# Patient Record
Sex: Female | Born: 1970 | Race: Black or African American | Hispanic: No | Marital: Married | Smoking: Never smoker
Health system: Southern US, Community
[De-identification: ages and names within clinical notes are randomized; demographics above are authoritative.]

## PROBLEM LIST (undated history)

## (undated) DIAGNOSIS — B9689 Other specified bacterial agents as the cause of diseases classified elsewhere: Secondary | ICD-10-CM

## (undated) DIAGNOSIS — K589 Irritable bowel syndrome without diarrhea: Secondary | ICD-10-CM

## (undated) DIAGNOSIS — D219 Benign neoplasm of connective and other soft tissue, unspecified: Secondary | ICD-10-CM

## (undated) DIAGNOSIS — Z803 Family history of malignant neoplasm of breast: Secondary | ICD-10-CM

## (undated) DIAGNOSIS — M797 Fibromyalgia: Secondary | ICD-10-CM

## (undated) DIAGNOSIS — T4145XA Adverse effect of unspecified anesthetic, initial encounter: Secondary | ICD-10-CM

## (undated) DIAGNOSIS — T8859XA Other complications of anesthesia, initial encounter: Secondary | ICD-10-CM

## (undated) DIAGNOSIS — E213 Hyperparathyroidism, unspecified: Secondary | ICD-10-CM

## (undated) DIAGNOSIS — Z87442 Personal history of urinary calculi: Secondary | ICD-10-CM

## (undated) DIAGNOSIS — I10 Essential (primary) hypertension: Secondary | ICD-10-CM

## (undated) DIAGNOSIS — Z8719 Personal history of other diseases of the digestive system: Secondary | ICD-10-CM

## (undated) DIAGNOSIS — N760 Acute vaginitis: Secondary | ICD-10-CM

## (undated) DIAGNOSIS — D649 Anemia, unspecified: Secondary | ICD-10-CM

## (undated) HISTORY — DX: Hyperparathyroidism, unspecified: E21.3

## (undated) HISTORY — PX: TUBAL LIGATION: SHX77

## (undated) HISTORY — DX: Family history of malignant neoplasm of breast: Z80.3

## (undated) HISTORY — DX: Anemia, unspecified: D64.9

---
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
## 1999-01-26 ENCOUNTER — Emergency Department (HOSPITAL_COMMUNITY): Admission: EM | Admit: 1999-01-26 | Discharge: 1999-01-26 | Payer: Self-pay | Admitting: Emergency Medicine

## 2000-03-01 ENCOUNTER — Emergency Department (HOSPITAL_COMMUNITY): Admission: EM | Admit: 2000-03-01 | Discharge: 2000-03-01 | Payer: Self-pay | Admitting: Internal Medicine

## 2001-10-14 ENCOUNTER — Emergency Department (HOSPITAL_COMMUNITY): Admission: EM | Admit: 2001-10-14 | Discharge: 2001-10-14 | Payer: Self-pay | Admitting: Emergency Medicine

## 2002-05-15 ENCOUNTER — Encounter (INDEPENDENT_AMBULATORY_CARE_PROVIDER_SITE_OTHER): Payer: Self-pay

## 2002-05-15 ENCOUNTER — Ambulatory Visit (HOSPITAL_COMMUNITY): Admission: RE | Admit: 2002-05-15 | Discharge: 2002-05-15 | Payer: Self-pay | Admitting: Obstetrics and Gynecology

## 2002-07-21 ENCOUNTER — Emergency Department (HOSPITAL_COMMUNITY): Admission: EM | Admit: 2002-07-21 | Discharge: 2002-07-21 | Payer: Self-pay | Admitting: Emergency Medicine

## 2002-07-21 ENCOUNTER — Encounter: Payer: Self-pay | Admitting: Emergency Medicine

## 2002-08-06 ENCOUNTER — Emergency Department (HOSPITAL_COMMUNITY): Admission: EM | Admit: 2002-08-06 | Discharge: 2002-08-06 | Payer: Self-pay | Admitting: Emergency Medicine

## 2003-11-12 ENCOUNTER — Ambulatory Visit (HOSPITAL_COMMUNITY): Admission: RE | Admit: 2003-11-12 | Discharge: 2003-11-12 | Payer: Self-pay | Admitting: Gastroenterology

## 2004-04-29 ENCOUNTER — Emergency Department (HOSPITAL_COMMUNITY): Admission: EM | Admit: 2004-04-29 | Discharge: 2004-04-29 | Payer: Self-pay | Admitting: Emergency Medicine

## 2004-07-20 ENCOUNTER — Ambulatory Visit: Payer: Self-pay | Admitting: Family Medicine

## 2004-08-23 ENCOUNTER — Ambulatory Visit: Payer: Self-pay | Admitting: *Deleted

## 2004-09-15 ENCOUNTER — Ambulatory Visit: Payer: Self-pay | Admitting: Family Medicine

## 2004-09-29 ENCOUNTER — Ambulatory Visit: Payer: Self-pay | Admitting: Family Medicine

## 2005-01-25 ENCOUNTER — Ambulatory Visit: Payer: Self-pay | Admitting: Internal Medicine

## 2005-02-08 ENCOUNTER — Ambulatory Visit: Payer: Self-pay | Admitting: Internal Medicine

## 2005-05-10 ENCOUNTER — Emergency Department (HOSPITAL_COMMUNITY): Admission: EM | Admit: 2005-05-10 | Discharge: 2005-05-10 | Payer: Self-pay | Admitting: Emergency Medicine

## 2005-06-13 ENCOUNTER — Ambulatory Visit: Payer: Self-pay | Admitting: Internal Medicine

## 2005-07-17 ENCOUNTER — Emergency Department (HOSPITAL_COMMUNITY): Admission: EM | Admit: 2005-07-17 | Discharge: 2005-07-17 | Payer: Self-pay | Admitting: Emergency Medicine

## 2005-07-18 ENCOUNTER — Ambulatory Visit: Payer: Self-pay | Admitting: Internal Medicine

## 2005-07-18 ENCOUNTER — Ambulatory Visit (HOSPITAL_COMMUNITY): Admission: RE | Admit: 2005-07-18 | Discharge: 2005-07-18 | Payer: Self-pay | Admitting: Internal Medicine

## 2005-07-19 ENCOUNTER — Inpatient Hospital Stay (HOSPITAL_COMMUNITY): Admission: AD | Admit: 2005-07-19 | Discharge: 2005-07-19 | Payer: Self-pay | Admitting: *Deleted

## 2005-07-22 ENCOUNTER — Ambulatory Visit: Payer: Self-pay | Admitting: Internal Medicine

## 2005-07-22 LAB — CONVERTED CEMR LAB: Pap Smear: NEGATIVE

## 2005-09-28 ENCOUNTER — Ambulatory Visit: Payer: Self-pay | Admitting: Internal Medicine

## 2006-06-12 ENCOUNTER — Ambulatory Visit: Payer: Self-pay | Admitting: Gastroenterology

## 2006-06-29 ENCOUNTER — Ambulatory Visit: Payer: Self-pay | Admitting: Gastroenterology

## 2006-07-11 IMAGING — US US RENAL
1 series · 14 of 25 positions shown · non-contrast
Comparison: none

CLINICAL DATA: Kidney stones.  
RENAL/URINARY TRACT ULTRASOUND:
Correlation is made with the previous unenhanced CT performed on 07/17/05.
TECHNIQUE: Complete ultrasound examination of the urinary tract was performed including evaluation of the kidneys, renal collecting systems, and urinary bladder.
Multiple images of both kidneys were obtained.  The right kidney has a sagittal length of 12 cm and the left kidney has a sagittal length of 12.7 cm. No focal parenchymal abnormalities are identified.  No sonographic signs of renal calculi are seen.  This would correlate with the negative unenhanced CT which is more sensitive for small renal stones than ultrasound. No evidence for hydronephrosis or ureterectasis is identified.  The bladder was incompletely distended.  The patient reports that she has not had much to drink today.  No evidence for ureteral jets could be seen on either side and lack of bilateral visualization of jets combined with this history is most likely due to relative dehydrated state. No focal bladder abnormalities are seen.

[Series 1: us renal · 0.29mm/px · 14 of 44 slices shown]
[im 1/44]
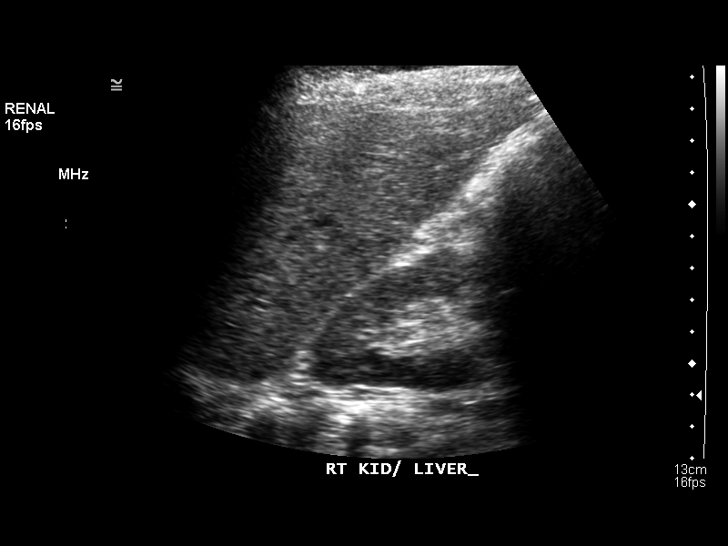
[im 4/44]
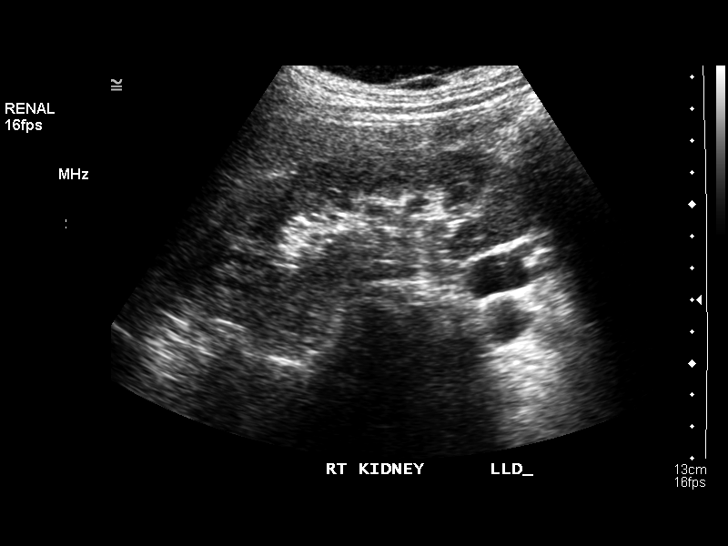
[im 8/44]
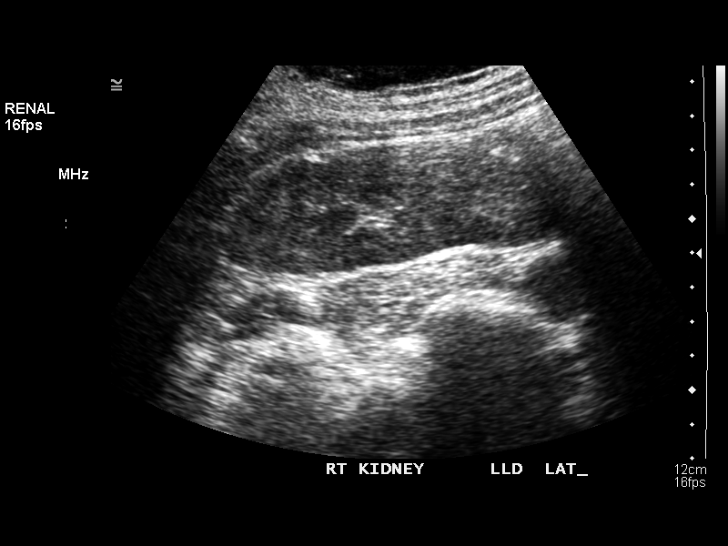
[im 11/44]
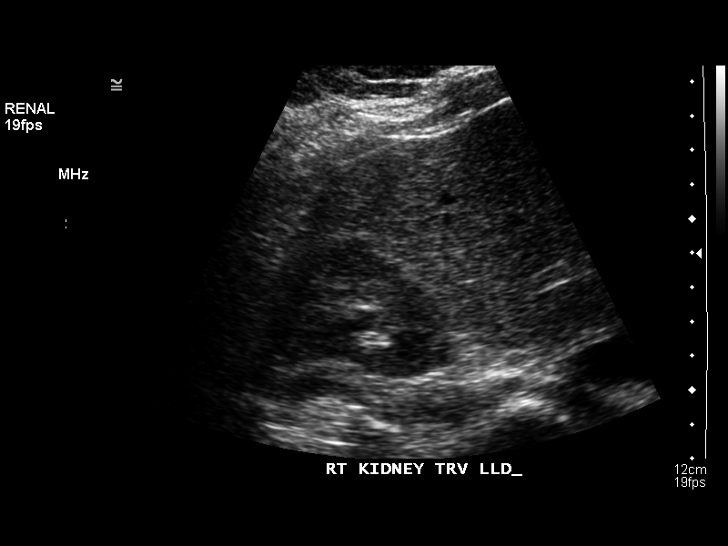
[im 15/44]
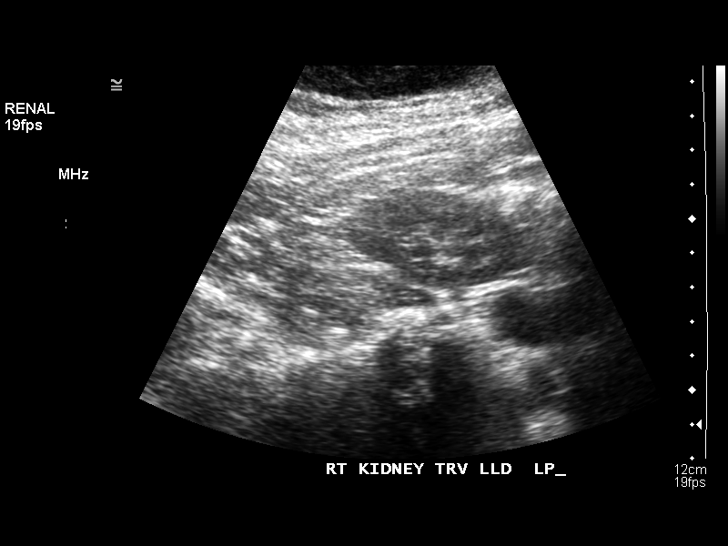
[im 17/44]
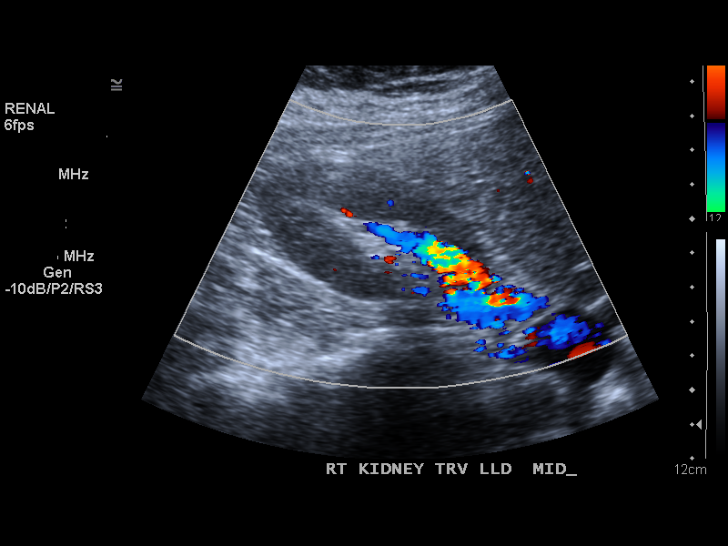
[im 20/44]
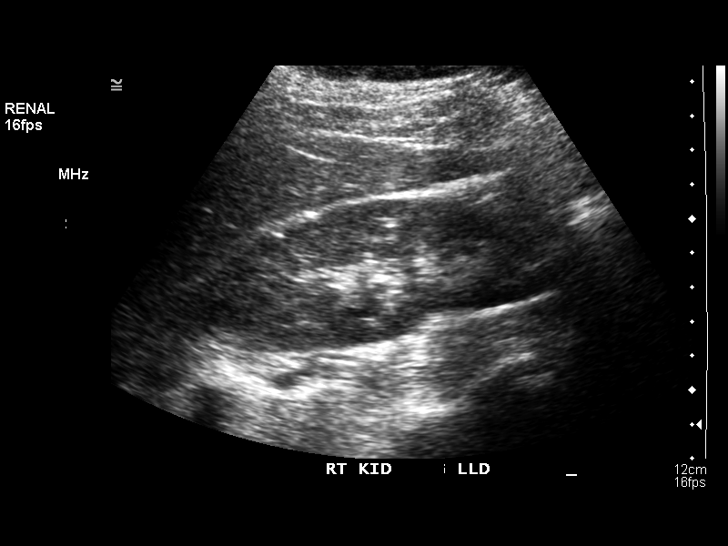
[im 24/44]
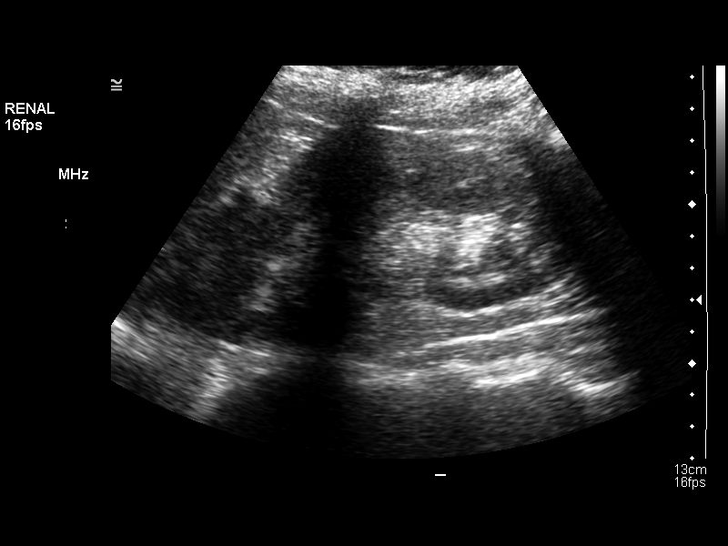
[im 27/44]
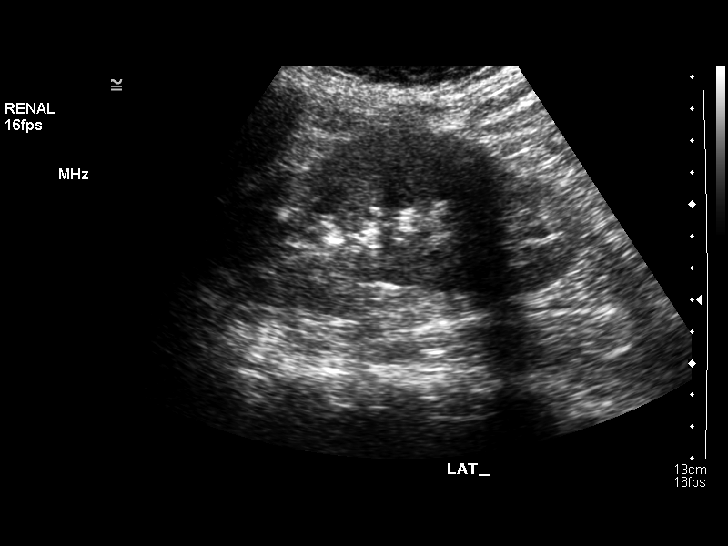
[im 29/44]
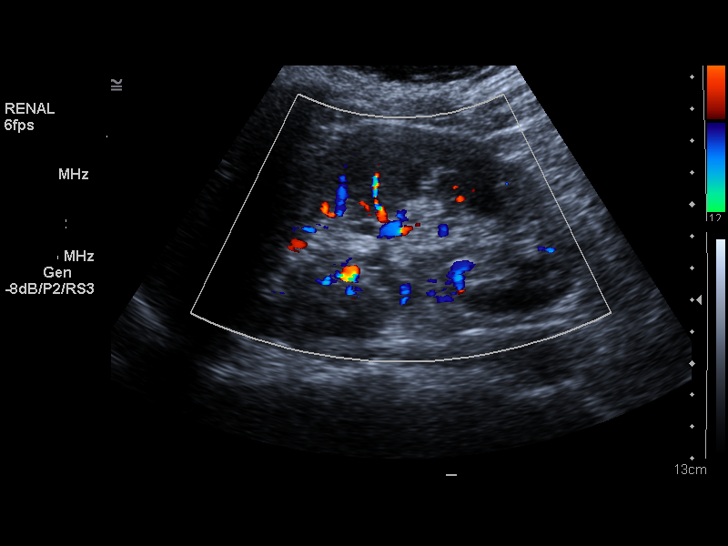
[im 33/44]
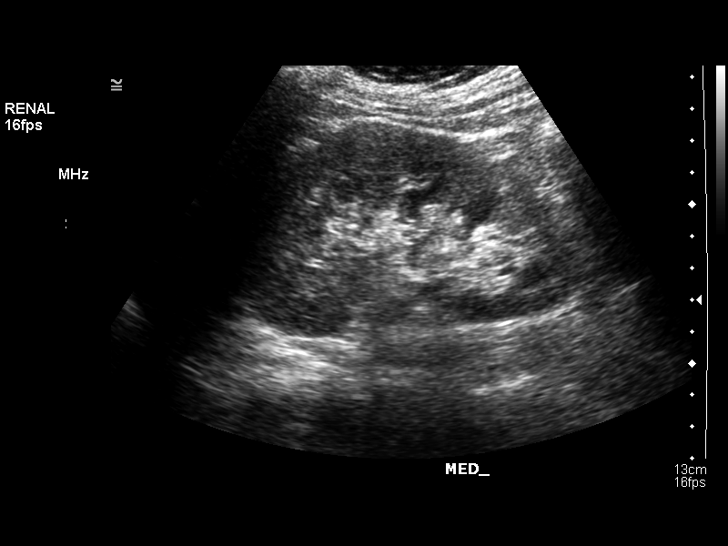
[im 36/44]
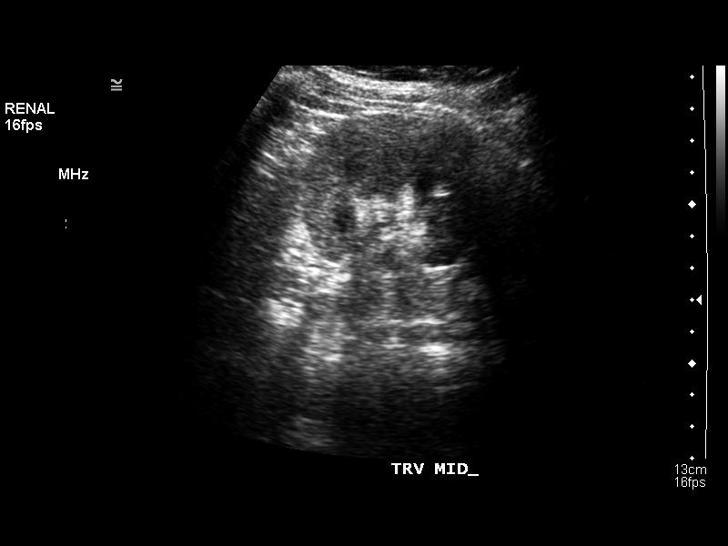
[im 40/44]
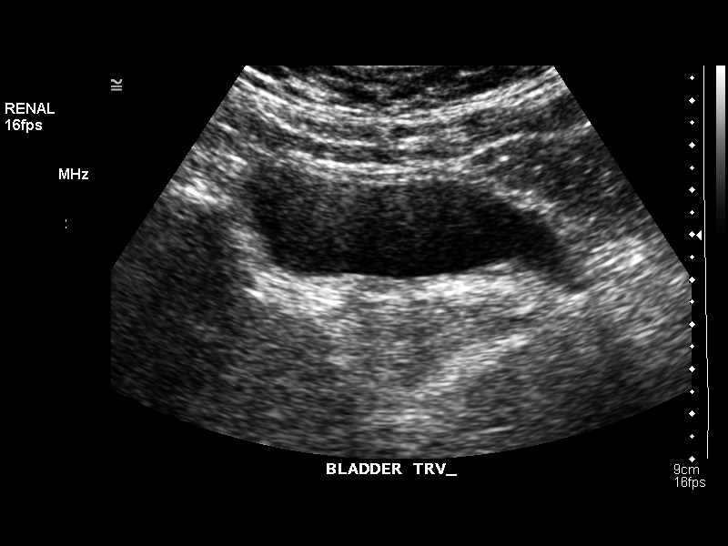
[im 44/44]
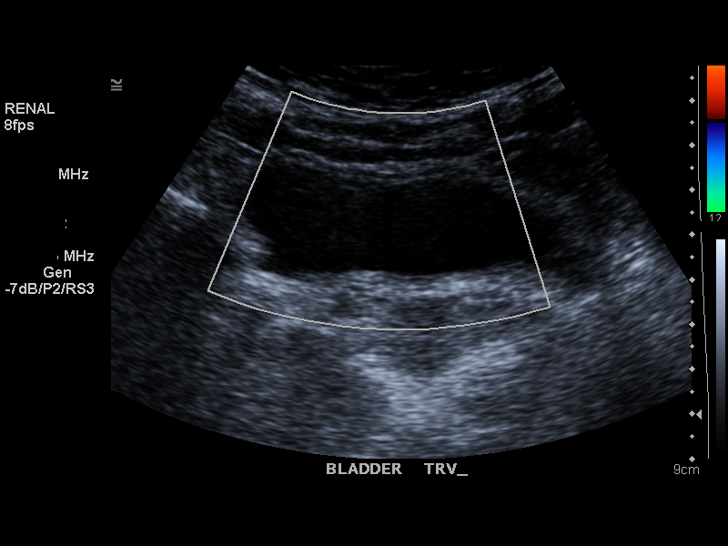

[14 of 25 positions shown; findings below may reference images not displayed]

IMPRESSION: Normal renal ultrasound.

## 2006-07-13 ENCOUNTER — Ambulatory Visit: Payer: Self-pay | Admitting: Gastroenterology

## 2006-07-20 ENCOUNTER — Encounter: Admission: RE | Admit: 2006-07-20 | Discharge: 2006-10-18 | Payer: Self-pay | Admitting: Sports Medicine

## 2006-07-25 ENCOUNTER — Ambulatory Visit: Payer: Self-pay | Admitting: Gastroenterology

## 2006-08-08 ENCOUNTER — Ambulatory Visit: Payer: Self-pay | Admitting: Gastroenterology

## 2006-09-01 ENCOUNTER — Ambulatory Visit: Payer: Self-pay | Admitting: Gastroenterology

## 2006-10-09 ENCOUNTER — Ambulatory Visit: Payer: Self-pay | Admitting: Gastroenterology

## 2006-11-01 ENCOUNTER — Ambulatory Visit: Payer: Self-pay | Admitting: Gastroenterology

## 2006-11-30 ENCOUNTER — Ambulatory Visit: Payer: Self-pay | Admitting: Gastroenterology

## 2007-04-18 ENCOUNTER — Ambulatory Visit: Payer: Self-pay | Admitting: Gastroenterology

## 2007-05-16 ENCOUNTER — Ambulatory Visit: Payer: Self-pay | Admitting: Gastroenterology

## 2007-09-06 ENCOUNTER — Ambulatory Visit: Payer: Self-pay | Admitting: Internal Medicine

## 2008-03-10 ENCOUNTER — Ambulatory Visit: Payer: Self-pay | Admitting: Nurse Practitioner

## 2008-03-10 DIAGNOSIS — N3289 Other specified disorders of bladder: Secondary | ICD-10-CM | POA: Insufficient documentation

## 2008-03-10 DIAGNOSIS — N760 Acute vaginitis: Secondary | ICD-10-CM | POA: Insufficient documentation

## 2008-03-10 DIAGNOSIS — K589 Irritable bowel syndrome without diarrhea: Secondary | ICD-10-CM

## 2008-03-10 HISTORY — DX: Irritable bowel syndrome, unspecified: K58.9

## 2008-03-10 LAB — CONVERTED CEMR LAB
Bilirubin Urine: NEGATIVE
Blood in Urine, dipstick: NEGATIVE
Glucose, Urine, Semiquant: NEGATIVE
Ketones, urine, test strip: NEGATIVE
Nitrite: NEGATIVE
Protein, U semiquant: NEGATIVE
Specific Gravity, Urine: <1.005
Urobilinogen, UA: 0.2
WBC Urine, dipstick: NEGATIVE
pH: 6.5

## 2008-03-11 ENCOUNTER — Encounter (INDEPENDENT_AMBULATORY_CARE_PROVIDER_SITE_OTHER): Payer: Self-pay | Admitting: Nurse Practitioner

## 2008-04-01 ENCOUNTER — Telehealth (INDEPENDENT_AMBULATORY_CARE_PROVIDER_SITE_OTHER): Payer: Self-pay | Admitting: Nurse Practitioner

## 2008-04-04 DIAGNOSIS — L408 Other psoriasis: Secondary | ICD-10-CM

## 2008-04-29 ENCOUNTER — Ambulatory Visit: Payer: Self-pay | Admitting: Nurse Practitioner

## 2008-04-29 DIAGNOSIS — D649 Anemia, unspecified: Secondary | ICD-10-CM

## 2008-04-29 DIAGNOSIS — R5383 Other fatigue: Secondary | ICD-10-CM | POA: Insufficient documentation

## 2008-04-29 DIAGNOSIS — E663 Overweight: Secondary | ICD-10-CM | POA: Insufficient documentation

## 2008-04-29 LAB — CONVERTED CEMR LAB
Bilirubin Urine: NEGATIVE
Blood in Urine, dipstick: NEGATIVE
Glucose, Urine, Semiquant: NEGATIVE
KOH Prep: NEGATIVE
Ketones, urine, test strip: NEGATIVE
Nitrite: NEGATIVE
Protein, U semiquant: 30
Specific Gravity, Urine: 1.025
Urobilinogen, UA: 1
WBC Urine, dipstick: NEGATIVE
pH: 6

## 2008-04-30 LAB — CONVERTED CEMR LAB
HCT: 35 % — ABNORMAL LOW (ref 36.0–46.0)
Hemoglobin: 10.9 g/dL — ABNORMAL LOW (ref 12.0–15.0)
MCHC: 31.1 g/dL (ref 30.0–36.0)
MCV: 100 fL (ref 78.0–100.0)
Pap Smear: NORMAL
Platelets: 301 10*3/uL (ref 150–400)
RBC: 3.5 M/uL — ABNORMAL LOW (ref 3.87–5.11)
RDW: 13.9 % (ref 11.5–15.5)
Retic Ct Pct: 1.7 % (ref 0.4–3.1)
WBC: 4.2 10*3/uL (ref 4.0–10.5)

## 2008-05-07 ENCOUNTER — Encounter (INDEPENDENT_AMBULATORY_CARE_PROVIDER_SITE_OTHER): Payer: Self-pay | Admitting: Nurse Practitioner

## 2008-05-15 ENCOUNTER — Encounter (INDEPENDENT_AMBULATORY_CARE_PROVIDER_SITE_OTHER): Payer: Self-pay | Admitting: *Deleted

## 2008-06-02 ENCOUNTER — Telehealth (INDEPENDENT_AMBULATORY_CARE_PROVIDER_SITE_OTHER): Payer: Self-pay | Admitting: *Deleted

## 2008-06-09 ENCOUNTER — Ambulatory Visit: Payer: Self-pay | Admitting: Nurse Practitioner

## 2008-06-11 LAB — CONVERTED CEMR LAB
Basophils Absolute: 0 10*3/uL (ref 0.0–0.1)
Basophils Relative: 0 % (ref 0–1)
Eosinophils Absolute: 0 10*3/uL (ref 0.0–0.7)
Eosinophils Relative: 1 % (ref 0–5)
HCT: 37.3 % (ref 36.0–46.0)
Hemoglobin: 11.7 g/dL — ABNORMAL LOW (ref 12.0–15.0)
Lymphocytes Relative: 39 % (ref 12–46)
Lymphs Abs: 1.7 10*3/uL (ref 0.7–4.0)
MCHC: 31.4 g/dL (ref 30.0–36.0)
MCV: 98.7 fL (ref 78.0–100.0)
Monocytes Absolute: 0.4 10*3/uL (ref 0.1–1.0)
Monocytes Relative: 8 % (ref 3–12)
Neutro Abs: 2.2 10*3/uL (ref 1.7–7.7)
Neutrophils Relative %: 52 % (ref 43–77)
Platelets: 314 10*3/uL (ref 150–400)
RBC: 3.78 M/uL — ABNORMAL LOW (ref 3.87–5.11)
RDW: 13.9 % (ref 11.5–15.5)
Vit D, 1,25-Dihydroxy: 41 (ref 30–89)
WBC: 4.3 10*3/uL (ref 4.0–10.5)

## 2008-06-18 ENCOUNTER — Telehealth (INDEPENDENT_AMBULATORY_CARE_PROVIDER_SITE_OTHER): Payer: Self-pay | Admitting: Nurse Practitioner

## 2008-08-22 ENCOUNTER — Telehealth (INDEPENDENT_AMBULATORY_CARE_PROVIDER_SITE_OTHER): Payer: Self-pay | Admitting: Nurse Practitioner

## 2008-09-01 ENCOUNTER — Telehealth (INDEPENDENT_AMBULATORY_CARE_PROVIDER_SITE_OTHER): Payer: Self-pay | Admitting: Nurse Practitioner

## 2008-09-26 ENCOUNTER — Emergency Department (HOSPITAL_COMMUNITY): Admission: EM | Admit: 2008-09-26 | Discharge: 2008-09-26 | Payer: Self-pay | Admitting: Emergency Medicine

## 2009-01-01 ENCOUNTER — Emergency Department (HOSPITAL_COMMUNITY): Admission: EM | Admit: 2009-01-01 | Discharge: 2009-01-01 | Payer: Self-pay | Admitting: Emergency Medicine

## 2009-06-01 ENCOUNTER — Telehealth (INDEPENDENT_AMBULATORY_CARE_PROVIDER_SITE_OTHER): Payer: Self-pay | Admitting: Nurse Practitioner

## 2009-06-08 ENCOUNTER — Ambulatory Visit: Payer: Self-pay | Admitting: Nurse Practitioner

## 2009-06-08 LAB — CONVERTED CEMR LAB
Blood in Urine, dipstick: NEGATIVE
Glucose, Urine, Semiquant: NEGATIVE
KOH Prep: NEGATIVE
Ketones, urine, test strip: NEGATIVE
Nitrite: NEGATIVE
Protein, U semiquant: 30
Specific Gravity, Urine: >=1.03
Urobilinogen, UA: 1
WBC Urine, dipstick: NEGATIVE
pH: 5

## 2009-06-12 ENCOUNTER — Telehealth (INDEPENDENT_AMBULATORY_CARE_PROVIDER_SITE_OTHER): Payer: Self-pay | Admitting: Nurse Practitioner

## 2009-11-25 ENCOUNTER — Telehealth (INDEPENDENT_AMBULATORY_CARE_PROVIDER_SITE_OTHER): Payer: Self-pay | Admitting: *Deleted

## 2009-11-30 ENCOUNTER — Ambulatory Visit: Payer: Self-pay | Admitting: Nurse Practitioner

## 2009-11-30 DIAGNOSIS — N898 Other specified noninflammatory disorders of vagina: Secondary | ICD-10-CM | POA: Insufficient documentation

## 2009-11-30 LAB — CONVERTED CEMR LAB
Bilirubin Urine: NEGATIVE
Blood in Urine, dipstick: NEGATIVE
Glucose, Urine, Semiquant: NEGATIVE
KOH Prep: NEGATIVE
Ketones, urine, test strip: NEGATIVE
Nitrite: NEGATIVE
Protein, U semiquant: 30
Rapid HIV Screen: NEGATIVE
Specific Gravity, Urine: 1.01
Urobilinogen, UA: 1
WBC Urine, dipstick: NEGATIVE
pH: 7.5

## 2009-12-01 ENCOUNTER — Telehealth (INDEPENDENT_AMBULATORY_CARE_PROVIDER_SITE_OTHER): Payer: Self-pay | Admitting: Nurse Practitioner

## 2009-12-02 ENCOUNTER — Encounter (INDEPENDENT_AMBULATORY_CARE_PROVIDER_SITE_OTHER): Payer: Self-pay | Admitting: Nurse Practitioner

## 2009-12-02 LAB — CONVERTED CEMR LAB
HCT: 35.5 % — ABNORMAL LOW (ref 36.0–46.0)
Hemoglobin: 11.4 g/dL — ABNORMAL LOW (ref 12.0–15.0)
MCHC: 32.1 g/dL (ref 30.0–36.0)
MCV: 95.9 fL (ref 78.0–100.0)
Platelets: 338 10*3/uL (ref 150–400)
RBC: 3.7 M/uL — ABNORMAL LOW (ref 3.87–5.11)
RDW: 14 % (ref 11.5–15.5)
Retic Ct Pct: 1.8 % (ref 0.4–3.1)
WBC: 4.3 10*3/uL (ref 4.0–10.5)

## 2010-01-05 ENCOUNTER — Ambulatory Visit: Payer: Self-pay | Admitting: Nurse Practitioner

## 2010-01-05 DIAGNOSIS — E559 Vitamin D deficiency, unspecified: Secondary | ICD-10-CM | POA: Insufficient documentation

## 2010-01-07 ENCOUNTER — Encounter (INDEPENDENT_AMBULATORY_CARE_PROVIDER_SITE_OTHER): Payer: Self-pay | Admitting: Nurse Practitioner

## 2010-01-07 LAB — CONVERTED CEMR LAB: Vit D, 25-Hydroxy: 34 ng/mL (ref 30–89)

## 2010-01-13 ENCOUNTER — Encounter (INDEPENDENT_AMBULATORY_CARE_PROVIDER_SITE_OTHER): Payer: Self-pay | Admitting: Nurse Practitioner

## 2010-02-16 ENCOUNTER — Ambulatory Visit: Payer: Self-pay | Admitting: Nurse Practitioner

## 2010-03-23 ENCOUNTER — Telehealth (INDEPENDENT_AMBULATORY_CARE_PROVIDER_SITE_OTHER): Payer: Self-pay | Admitting: *Deleted

## 2010-03-26 ENCOUNTER — Emergency Department (HOSPITAL_COMMUNITY): Admission: EM | Admit: 2010-03-26 | Discharge: 2010-03-26 | Payer: Self-pay | Admitting: Family Medicine

## 2010-03-29 ENCOUNTER — Telehealth (INDEPENDENT_AMBULATORY_CARE_PROVIDER_SITE_OTHER): Payer: Self-pay | Admitting: Nurse Practitioner

## 2010-04-02 ENCOUNTER — Encounter (INDEPENDENT_AMBULATORY_CARE_PROVIDER_SITE_OTHER): Payer: Self-pay | Admitting: *Deleted

## 2010-06-26 ENCOUNTER — Emergency Department (HOSPITAL_COMMUNITY)
Admission: EM | Admit: 2010-06-26 | Discharge: 2010-06-26 | Payer: Self-pay | Source: Home / Self Care | Admitting: Family Medicine

## 2010-07-21 ENCOUNTER — Ambulatory Visit: Payer: Self-pay | Admitting: Nurse Practitioner

## 2010-07-21 DIAGNOSIS — I10 Essential (primary) hypertension: Secondary | ICD-10-CM | POA: Insufficient documentation

## 2010-07-21 DIAGNOSIS — F329 Major depressive disorder, single episode, unspecified: Secondary | ICD-10-CM

## 2010-08-16 ENCOUNTER — Telehealth (INDEPENDENT_AMBULATORY_CARE_PROVIDER_SITE_OTHER): Payer: Self-pay | Admitting: Nurse Practitioner

## 2010-09-08 ENCOUNTER — Telehealth (INDEPENDENT_AMBULATORY_CARE_PROVIDER_SITE_OTHER): Payer: Self-pay | Admitting: Nurse Practitioner

## 2010-09-10 ENCOUNTER — Ambulatory Visit: Payer: Self-pay | Admitting: Nurse Practitioner

## 2010-09-10 LAB — CONVERTED CEMR LAB
Basophils Absolute: 0 10*3/uL (ref 0.0–0.1)
Basophils Relative: 0 % (ref 0–1)
Eosinophils Absolute: 0 10*3/uL (ref 0.0–0.7)
Eosinophils Relative: 1 % (ref 0–5)
HCT: 38.7 % (ref 36.0–46.0)
Hemoglobin: 12.6 g/dL (ref 12.0–15.0)
Lymphocytes Relative: 42 % (ref 12–46)
Lymphs Abs: 2.4 10*3/uL (ref 0.7–4.0)
MCHC: 32.6 g/dL (ref 30.0–36.0)
MCV: 95.8 fL (ref 78.0–100.0)
Monocytes Absolute: 0.5 10*3/uL (ref 0.1–1.0)
Monocytes Relative: 9 % (ref 3–12)
Neutro Abs: 2.7 10*3/uL (ref 1.7–7.7)
Neutrophils Relative %: 48 % (ref 43–77)
Platelets: 310 10*3/uL (ref 150–400)
RBC: 4.04 M/uL (ref 3.87–5.11)
RDW: 14.2 % (ref 11.5–15.5)
Vit D, 25-Hydroxy: 76 ng/mL (ref 30–89)
WBC: 5.7 10*3/uL (ref 4.0–10.5)

## 2010-09-13 ENCOUNTER — Encounter (INDEPENDENT_AMBULATORY_CARE_PROVIDER_SITE_OTHER): Payer: Self-pay | Admitting: Nurse Practitioner

## 2010-09-15 ENCOUNTER — Emergency Department (HOSPITAL_COMMUNITY): Admission: EM | Admit: 2010-09-15 | Discharge: 2010-09-15 | Payer: Self-pay | Admitting: Family Medicine

## 2010-10-13 ENCOUNTER — Telehealth (INDEPENDENT_AMBULATORY_CARE_PROVIDER_SITE_OTHER): Payer: Self-pay | Admitting: Nurse Practitioner

## 2010-10-19 ENCOUNTER — Telehealth (INDEPENDENT_AMBULATORY_CARE_PROVIDER_SITE_OTHER): Payer: Self-pay | Admitting: Nurse Practitioner

## 2010-10-27 ENCOUNTER — Telehealth (INDEPENDENT_AMBULATORY_CARE_PROVIDER_SITE_OTHER): Payer: Self-pay | Admitting: Nurse Practitioner

## 2010-10-28 ENCOUNTER — Telehealth (INDEPENDENT_AMBULATORY_CARE_PROVIDER_SITE_OTHER): Payer: Self-pay | Admitting: Nurse Practitioner

## 2010-11-06 ENCOUNTER — Emergency Department (HOSPITAL_COMMUNITY)
Admission: EM | Admit: 2010-11-06 | Discharge: 2010-11-06 | Payer: Self-pay | Source: Home / Self Care | Admitting: Family Medicine

## 2010-11-11 ENCOUNTER — Ambulatory Visit: Payer: Self-pay | Admitting: Nurse Practitioner

## 2010-12-12 ENCOUNTER — Encounter: Payer: Self-pay | Admitting: Internal Medicine

## 2010-12-14 ENCOUNTER — Encounter (INDEPENDENT_AMBULATORY_CARE_PROVIDER_SITE_OTHER): Payer: Self-pay | Admitting: Nurse Practitioner

## 2010-12-23 NOTE — Letter (Signed)
Summary: *HSN Results Follow up  [facility]  [address] [place], [place] [address]   Phone: [phone]  Fax: [phone]      09/13/2010   [name] [address] [place], [place]  [address]   Dear  [name],                            ____S.Drinkard,FNP   ____D. Gore,FNP       ____B. McPherson,MD   ____V. Rankins,MD    ____E. Mulberry,MD    __X__N. Daphine Deutscher, FNP  ____D. Reche Dixon, MD    ____K. Philipp Deputy, MD    ____Other     This letter is to inform you that your recent test(s):  _______Pap Smear    ___X____Lab Test     _______X-ray    ___X____ is within acceptable limits  _______ requires a medication change  _______ requires a follow-up lab visit  _______ requires a follow-up visit with your provider   Comments: Labs done during recent office visit are NORMAL.       _________________________________________________________ If you have any questions, please contact our office [phone].                    Sincerely,    Lehman Prom FNP [facility]

## 2010-12-23 NOTE — Progress Notes (Signed)
Summary: THINKS VITAMIN D IS LOW AGAIN  Phone Note Call from Patient Call back at Home Phone [phone]   Reason for Call: Acute Illness Summary of Call: [name] [name] CALLED TO GET AN APPT. FOR TOMORROW BECAUSE SHE IS EXHAUSTED AND SHE THINKS IT HER VITAMIN D LOW AGAIN. SHE IS FEELING LKIE SHE FELT BEFORE.  HER ELIG HAS EXPIRED AND SHE IS GOING TO TRY GOING [facility] TOMORROW. Initial call taken by: Leodis Rains,  Mar 29, 2010 4:54 PM  Follow-up for Phone Call        forward to N. Daphine Deutscher, fnp Follow-up by: Levon Hedger,  Mar 29, 2010 5:30 PM  Additional Follow-up for Phone Call Additional follow up Details #1::        the same message from phone note on 03/23/2010 applies where pt was informed  that if she is having an acute problem she will need to either renew her card or go to urgent care to be seen. She can take over the counter vitamin D 1000 units by mouth two times a day in the meantime until she can get her level rechecked. Additional Follow-up by: Lehman Prom FNP,  Mar 29, 2010 5:33 PM    Additional Follow-up for Phone Call Additional follow up Details #2::    Levon Hedger  Mar 30, 2010 12:34 PM Left message on machine for pt to return cal to the office.  Levon Hedger  Mar 31, 2010 12:03 PM Left message on machine for pt to return call to the office.  Levon Hedger  Apr 02, 2010 5:18 PM Left message on machine for pt to return call to the office.  Will mail letter.

## 2010-12-23 NOTE — Progress Notes (Signed)
Summary: Pt requesting for a medication to clean her vaginal bacteria  Phone Note Call from Patient Call back at [facility] Phone [phone] Call back at [phone]   Summary of Call: The pt needs to come for a physical exam on Jan 10 but she has a vaginal bacteria in her female area and she is wondering if the provider can call in something that her to clear up before her visit.  Pt goes to [facility] pharm [phone] FNP  Initial call taken by: Manon Hilding,  November 25, 2009 11:39 AM  Follow-up for Phone Call        Macy Mis, we do not call in meds for vaginal problems. pt must make an appt. please call pt to schedule an appt. Provider appt or nurse visit for that problem only.. Follow-up by: Mikey College CMA,  November 26, 2009 9:38 AM  Additional Follow-up for Phone Call Additional follow up Details #1::        I left a message in her voice mail to call me back.Manon Hilding  November 26, 2009 1:43 PM  Pt will come tomorrow at 3:15 for a nurse visit.Marland KitchenMarland KitchenManon Hilding  November 26, 2009 4:58 PM

## 2010-12-23 NOTE — Progress Notes (Signed)
Summary: acute- Recurrence of BV  Phone Note Call from Patient Call back at [phone]   Reason for Call: Acute Illness, Talk to Nurse Complaint: Urinary/GYN Problems Summary of Call: pt needs meds for vaginal discharge pt would like if you can [facility]. Initial call taken by: Oscar La,  August 16, 2010 9:25 AM  Follow-up for Phone Call        Feeling that BV is coming on, having odor that past two days, a little discharge, brownish in color.  Taking oral  probiotics.  Wants to know if she can have something called in, rather than coming in to see Larra Crunkleton. Follow-up by: Dutch Quint RN,  August 16, 2010 9:42 AM  Additional Follow-up for Phone Call Additional follow up Details #1::        Rx for metroconizole faxted to [facility] pharmacy if symptoms continue pt will need a f/u appt with Brentlee Sciara Additional Follow-up by: Lehman Prom FNP,  August 16, 2010 11:39 AM    Additional Follow-up for Phone Call Additional follow up Details #2::    Left message on answering machine for pt. to return call.  Dutch Quint RN  August 16, 2010 11:50 AM  Left message on answering machine for pt. to return call.  Dutch Quint RN  August 17, 2010 10:24 AM  Left message on answering machine for pt. to return call.  Dutch Quint RN  August 18, 2010 2:43 PM  Was no show for 08/18/10 appt.  Picked up Rx per pharmacy.  Dutch Quint RN  August 19, 2010 5:21 PM  New/Updated Medications: METRONIDAZOLE 500 MG TABS (METRONIDAZOLE) One tablet by mouth two times a day for infection Prescriptions: METRONIDAZOLE 500 MG TABS (METRONIDAZOLE) One tablet by mouth two times a day for infection  #14 x 0   Entered and Authorized by:   Lehman Prom FNP   Signed by:   Lehman Prom FNP on 08/16/2010   Method used:   Faxed to ...       [facility] - Pharmac (retail)       [address] [place], [place]  [address]       Ph:  [phone]       Fax: [phone]   RxID:   [id number]

## 2010-12-23 NOTE — Letter (Signed)
Summary: GYN CLINIC  GYN CLINIC   Imported By: Arta Bruce 12/16/2010 10:09:10  _____________________________________________________________________  External Attachment:    Type:   Image     Comment:   External Document

## 2010-12-23 NOTE — Letter (Signed)
Summary: Handout Printed  Printed Handout:  - Psoriasis

## 2010-12-23 NOTE — Progress Notes (Signed)
Summary: GYN clinic  Phone Note Outgoing Call   Summary of Call: Refer pt to GYN clinic - Cleoph[facility] Dunker frequent vaginal discharge Initial call taken by: Lehman Prom FNP,  October 28, 2010 7:27 PM  Follow-up for Phone Call        I call [facility] to schedule an appt with GYN and they told me that the pt need to call next week because right now they are not scheduling appts . and the same thing for podiatry. Do you want me to call the pt and told them to call [facility] N[facility] next week or hold the referrals until next week. I have 2 pts gyn and 1 podiatry  thank you  Follow-up by: Cheryll Dessert,  November 03, 2010 9:44 AM  Additional Follow-up for Phone Call Additional follow up Details #1::        just hold the referrals until next week, call [facility] and schedule pts then   *I will do that .Marland KitchenCheryll Dessert  November 05, 2010 10:45 AM  Additional Follow-up by: Lehman Prom FNP,  November 03, 2010 10:02 AM

## 2010-12-23 NOTE — Letter (Signed)
Summary: *HSN Results Follow up  [facility]  [address] [place], [place] [address]   Phone: [phone]  Fax: [phone]      01/07/2010   Katelyn Hardin [address] [place], [place]  [address]   Dear  [name],                            ____S.Drinkard,FNP   ____D. Gore,FNP       ____B. McPherson,MD   ____V. Rankins,MD    ____E. Mulberry,MD    _X___N. Daphine Deutscher, FNP  ____D. Reche Dixon, MD    ____K. Philipp Deputy, MD    ____Other     This letter is to inform you that your recent test(s):  _______Pap Smear    ___X____Lab Test     _______X-ray    __X_____ is within acceptable limits  _______ requires a medication change  _______ requires a follow-up lab visit  _______ requires a follow-up visit with your provider   Comments:  Your vitamin D level is back up to normal. You can now start taking over the counter vitamin d supplements 1000 units by mouth daily to keep your level up.      _________________________________________________________ If you have any questions, please contact our office [phone].                    Sincerely,    Lehman Prom FNP [facility]

## 2010-12-23 NOTE — Letter (Signed)
Summary: *HSN Results Follow up  [facility]  [address] [place], [place] [address]   Phone: [phone]  Fax: [phone]      04/02/2010   Reita May MCADOO [address] [place], [place]  [address]   Dear  Ms. Ladashia MCADOO,                            ____S.Drinkard,FNP   ____D. Gore,FNP       ____B. McPherson,MD   ____V. Rankins,MD    ____E. Mulberry,MD    ____N. Daphine Deutscher, FNP  ____D. Reche Dixon, MD    ____K. Philipp Deputy, MD    ____Other     This letter is to inform you that your recent test(s):  _______Pap Smear    _______Lab Test     _______X-ray    _______ is within acceptable limits  _______ requires a medication change  _______ requires a follow-up lab visit  _______ requires a follow-up visit with your provider   Comments:  We have tried to reach you at [phone].  You can take a Vitamin D supplement 1000 units one tablet by mouth twice a day.  This can be purchased over the counter at any pharmacy. Please schedule an eligibility appointment at your earliest convenience.  Once you have your eligibility then call to schedule an appointment.  If you have any questions please contact the office.       _________________________________________________________ If you have any questions, please contact our office                     Sincerely,  Levon Hedger [facility]

## 2010-12-23 NOTE — Progress Notes (Signed)
Summary: Office Visit/DEPRESSION SCREENING  Office Visit/DEPRESSION SCREENING   Imported By: Arta Bruce 01/13/2010 10:43:03  _____________________________________________________________________  External Attachment:    Type:   Image     Comment:   External Document

## 2010-12-23 NOTE — Assessment & Plan Note (Signed)
Summary: Psoriasis   Vital Signs:  Patient profile:   40 year old female Height:      67 inches Weight:      167.6 pounds BMI:     26.34 Temp:     98.3 degrees F Pulse rate:   85 / minute Pulse rhythm:   regular Resp:     20 per minute BP sitting:   144 / 93  (left arm) Cuff size:   regular  Vitals Entered By: Vesta Mixer CMA (February 16, 2010 8:29 AM)  Nutrition Counseling: Patient's BMI is greater than 25 and therefore counseled on weight management options. CC: Rash on neck first noticed last Sunday, it does not itch.  She is not sure if it is her psoriasis or from the braids she had or what. Is Patient Diabetic? No Pain Assessment Patient in pain? no       Does patient need assistance? Ambulation Normal   CC:  Rash on neck first noticed last Sunday and it does not itch.  She is not sure if it is her psoriasis or from the braids she had or what..  History of Present Illness:  Pt into the office with a complaints of a rash. She noticed 1.5 week ago on the back of her neck and upper back. Area don't itch She does have a history of psoriasis but has never had symptoms in current area She did have braids in her hair until 1 week ago but they had been in for 2 months. She is really unsure when the rash started as the affected area was covered with braids.  She has been using the desonide (previously prescribed) and the areas are clearing. Uses Dove soap   She has other sporatic areas of concern around her waist line and on inner thigh.   Dry, crusty areas - usually puts vasoline to affected areas  Obesity - efforts continue at weight loss.  Down 5 pounds since last visit  Elevated blood pressure - no hx of elevated blood pressure in effort to lose weight she is eating some foods with preservatives and high sodium content  Habits & Providers  Alcohol-Tobacco-Diet     Alcohol drinks/day: <1     Alcohol Counseling: not indicated; use of alcohol is not excessive or  problematic     Alcohol type: wine     Tobacco Status: never  Exercise-Depression-Behavior     Does Patient Exercise: no     Drug Use: no     Seat Belt Use: 100     Sun Exposure: occasionally  Allergies (verified): 1)  ! Tylenol  Review of Systems CV:  Denies chest pain or discomfort. Resp:  Denies cough. Derm:  Complains of rash; upper neck and back.  Physical Exam  General:  alert.   Head:  normocephalic.   Msk:  up to the exam table Neurologic:  alert & oriented X3.   Skin:  posterior neck - dry plaques, scaly rash, psoriasis appearance waistline - dry, hyperpigmented papules Psych:  Oriented X3.     Impression & Recommendations:  Problem # 1:  PSORIASIS (ICD-696.1) Braids likely caused trauma, excoriation to the back of the neck which start psoriasis outbreak advised pt to continue to use ointment she has already removed the braids so this will not be of concern advised pt to use betasept to the other areas on her inner thighs  Problem # 2:  ELEVATED BLOOD PRESSURE WITHOUT DIAGNOSIS OF HYPERTENSION (ICD-796.2) handout given to pt  lifestyle modifications no meds at this time  Complete Medication List: 1)  Boric Acid 600mg   .... 1 suppository intravaginally nightly x 2 weeks 2)  Desonide 0.05 % Oint (Desonide) .Marland Kitchen.. 1 application topically to affected area  two times a day as needed 3)  Ferrous Sulfate 325 (65 Fe) Mg Tbec (Ferrous sulfate) .... One tablet by mouth daily 4)  Betasept Surgical Scrub 4 % Liqd (Chlorhexidine gluconate) .... One application topically to affected area, let sit for 5 mintues then rinse.  use three times per week  Patient Instructions: 1)  Use the Betasept topically to affected area on your waist and inner thigh three times per week.  Let sit for 5 minutes then rinse 2)  Boric Acid prescription should be sent to [facility]. Check there for prescription previously sent. 3)  Follow up as needed. Prescriptions: DESONIDE 0.05 %  OINT  (DESONIDE) 1 application topically to affected area  two times a day as needed  #60gm x 1   Entered and Authorized by:   Lehman Prom FNP   Signed by:   Lehman Prom FNP on 02/16/2010   Method used:   Faxed to ...       [facility] - Pharmac (retail)       [address] [place], [place]  [address]       Ph: [phone]       Fax: [phone]   RxID:   [id number] BETASEPT SURGICAL SCRUB 4 % LIQD (CHLORHEXIDINE GLUCONATE) One application topically to affected area, let sit for 5 mintues then rinse.  Use three times per week  #146ml x 0   Entered and Authorized by:   Lehman Prom FNP   Signed by:   Lehman Prom FNP on 02/16/2010   Method used:   Faxed to ...       [facility] - Pharmac (retail)       [address] [place], [place]  [address]       Ph: [phone]       Fax: [phone]   RxID:   [id number]

## 2010-12-23 NOTE — Progress Notes (Signed)
Summary: poss bacterial inf  Phone Note Call from Patient Call back at [phone]   Summary of Call: Pt is possitive that she has a bacteria infection again in her female area and the pt wants to know if the provider can call in something to the pharmacy . Pt orange card already expired in April 2011 so that is why she needs if is possible the med can be call in.  [facility] [address] [phone])  Daphine Deutscher FNP Initial call taken by: Manon Hilding,  Mar 23, 2010 8:57 AM  Follow-up for Phone Call        please schedule for next available appt. Follow-up by: [name] RN,  Mar 23, 2010 12:24 PM  Additional Follow-up for Phone Call Additional follow up Details #1::        MS MCADOO CALLED AND SAYS THAT SHE CANT DO ELIG. UNTIL JUNE, SO SHE IS ASKING IF YOU CAN CALL SOMETHING INTO [facility] ON [address]. Additional Follow-up by: Leodis Rains,  Mar 24, 2010 9:36 AM    Additional Follow-up for Phone Call Additional follow up Details #2::    I deliverd the message to the pt from the provider and she will go to the Urgent Care to be treated.Manon Hilding  Mar 25, 2010 9:06 AM

## 2010-12-23 NOTE — Assessment & Plan Note (Signed)
Summary: Complete Physical Exam   Vital Signs:  Patient profile:   40 year old female Weight:      172.7 pounds BMI:     27.15 BSA:     1.90 Temp:     98.0 degrees F oral Pulse rate:   73 / minute Pulse rhythm:   regular Resp:     16 per minute BP sitting:   133 / 86  (left arm) Cuff size:   regular  Vitals Entered By: Levon Hedger (November 30, 2009 11:32 AM)  History of Present Illness:  Pt into the office for a complete physical exam.  Social - married with 2 adult children  PAP - last over 1 year ago no family hx of cervical or ovarian cancer C/o vaginal odor. discharge has subsided but odor is the main problem pt was advised on last week to start eating yogurt, no douching, clean cotton underwear, drink plenty of water  Mammogram - never had a mammogram no self breast exams at home  tdap - up to date  optho - no eye exam in over 1.5 years.  no current problems with eyes  dental - no recent dental exam. no problem with teeth   Habits & Providers  Alcohol-Tobacco-Diet     Alcohol type: alcohol     Tobacco Status: never  Exercise-Depression-Behavior     Does Patient Exercise: no     Have you felt down or hopeless? no     Have you felt little pleasure in things? no     Drug Use: no     Seat Belt Use: 100     Sun Exposure: occasionally  Comments: PHQ-9 score = 12  Allergies: 1)  ! Tylenol  Review of Systems General:  Complains of fatigue. Eyes:  Denies blurring. ENT:  Denies earache. CV:  Denies chest pain or discomfort. Resp:  Denies cough. GI:  Denies abdominal pain, nausea, and vomiting. GU:  Complains of discharge; denies urinary frequency. MS:  Denies joint pain. Derm:  Denies rash. Neuro:  Denies headaches. Psych:  Denies anxiety and depression.  Physical Exam  General:  alert.   Head:  normocephalic.   Eyes:  pupils equal and pupils round.   Ears:  R ear normal and L ear normal.  Bil TM visible with bony landmarks present Nose:   no nasal discharge.   Mouth:  pharynx pink and moist and fair dentition.   Neck:  supple.   Chest Wall:  no mass.   Breasts:  no masses.   Lungs:  normal respiratory effort.   Heart:  normal rate and regular rhythm.   Abdomen:  soft, non-tender, and normal bowel sounds.   Rectal:  no external abnormalities.   Msk:  normal ROM.   Pulses:  R radial normal, R dorsalis pedis normal, L radial normal, and L dorsalis pedis normal.   Extremities:  no edema Neurologic:  alert & oriented X3 and gait normal.   Skin:  color normal.   Psych:  Oriented X3.    Pelvic Exam  Vulva:      normal appearance.   Urethra and Bladder:      Urethra--normal.  Bladder--normal.   Vagina:      malodorus.  introdus with ? warts  Cervix:      nabothian cysts.  noted at 3 o'clock Uterus:      smooth.   Adnexa:      nontender bilaterally.   Rectum:      normal.  Impression & Recommendations:  Problem # 1:  HEALTH MAINTENANCE EXAM (ICD-V70.0) PAP done labs done optho and dental exam rec PHQ-9 score = 12 self breast exam placcard given Orders: UA Dipstick w/o Micro (manual) (82956) KOH/ WET Mount [phone]) Pap Smear, Thin Prep ( Collection of) [phone]) T- GC Chlamydia (69629) T-Lipid Profile [id number]) T-Comprehensive Metabolic Panel [phone]) T-CBC w/Diff (40347-42595) Rapid HIV  (92370) T-Syphilis Test (RPR) (63875-64332) T-TSH (95188-41660)  Problem # 2:  NEED PROPHYLACTIC VACCINATION&INOCULATION FLU (ICD-V04.81) given today  Problem # 3:  VAGINAL DISCHARGE (ICD-623.5) will check today with PAP BV dx today during exam Orders: T-Herpes Simplex Type 1 (63016-01093) T-Herpes Simplex Type 2 (23557-32202)  Problem # 4:  FATIGUE (ICD-780.79) will check vitamin D.  hx of defienciency Orders: T-Vitamin D 25-Hydroxy & 1,25 Dihydroxy (8147)  Complete Medication List: 1)  Boric Acid 600mg   .... 1 suppository intravaginally nightly x 2 weeks 2)  Desonide 0.05 % Oint (Desonide)  .Marland Kitchen.. 1 application topically to affected area  two times a day as needed 3)  Metamucil 48.57 % Powd (Psyllium) .Marland Kitchen.. 1 tablespoon mixed in at least 8 oz of water or juice daily 4)  Ferrous Sulfate 325 (65 Fe) Mg Tbec (Ferrous sulfate) .Marland Kitchen.. 1 tablet by mouth two times a day to build up blood 5)  Vitamin D 1000 Unit Caps (Cholecalciferol) .... Hold 6)  Clindamycin Hcl 300 Mg Caps (Clindamycin hcl) .... One tablet by mouth two times a day for infection  Other Orders: Flu Vaccine 56yrs + (54270) Admin 1st Vaccine (62376) Admin 1st Vaccine [facility]) [phone])  Patient Instructions: 1)  You will be notified of any abnormal lab results 2)  You should check your breasts at home monthly.  Mammogram is not indicated until age 39. 3)  You have recieved the flu vaccine today 4)  Bacterial vaginosis - overgrowth of normal bacteria is seen today under the microscope. 5)  Start all the techniques described on last week 6)  -yogurt three times per wee 7)  -drink plenty of water 8)  -be sure the seat of your underwear is white cotton 9)  -absolutely NO scented body wash or soap in the vaginal area 10)  Follow up as needed Prescriptions: BORIC ACID 600MG  1 suppository intravaginally nightly x 2 weeks  #14 x 1   Entered and Authorized by:   Lehman Prom FNP   Signed by:   Lehman Prom FNP on 11/30/2009   Method used:   Print then Give to Patient   RxID:   [id number] CLINDAMYCIN HCL 300 MG CAPS (CLINDAMYCIN HCL) One tablet by mouth two times a day for infection  #14 x 0   Entered and Authorized by:   Lehman Prom FNP   Signed by:   Lehman Prom FNP on 11/30/2009   Method used:   Print then Give to Patient   RxID:   [id number]   Laboratory Results   Urine Tests  Date/Time Received: November 30, 2009 12:45 PM  Date/Time Reported: November 30, 2009 12:45 PM   Routine Urinalysis   Color: lt. yellow Appearance: Clear Glucose: negative   (Normal Range: Negative) Bilirubin:  negative   (Normal Range: Negative) Ketone: negative   (Normal Range: Negative) Spec. Gravity: 1.010   (Normal Range: 1.003-1.035) Blood: negative   (Normal Range: Negative) pH: 7.5   (Normal Range: 5.0-8.0) Protein: 30   (Normal Range: Negative) Urobilinogen: 1.0   (Normal Range: 0-1) Nitrite: negative   (Normal Range: Negative) Leukocyte Esterace: negative   (Normal Range:  Negative)    Date/Time Received: November 30, 2009 12:16 PM   [employer] Source: vaginal WBC/hpf: 5-10 Bacteria/hpf: 1+ Clue cells/hpf: moderate Yeast/hpf: none Wet Mount KOH: Negative Trichomonas/hpf: none  Other Tests  Rapid HIV: negative    Influenza Vaccine    Vaccine Type: Fluvax 3+    Site: left deltoid    Mfr: Sanofi Pasteur    Dose: 0.5 ml    Route: IM    Given by: Levon Hedger    Exp. Date: 05/20/2010    Lot #: [id number]    VIS given: 06/14/07 version given November 30, 2009.  Flu Vaccine Consent Questions    Do you have a history of severe allergic reactions to this vaccine? no    Any prior history of allergic reactions to egg and/or gelatin? no    Do you have a sensitivity to the preservative Thimersol? no    Do you have a past history of Guillan-Barre Syndrome? no    Do you currently have an acute febrile illness? no    Have you ever had a severe reaction to latex? no    Vaccine information given and explained to patient? yes    Are you currently pregnant? no   Laboratory Results   Urine Tests    Routine Urinalysis   Color: lt. yellow Appearance: Clear Glucose: negative   (Normal Range: Negative) Bilirubin: negative   (Normal Range: Negative) Ketone: negative   (Normal Range: Negative) Spec. Gravity: 1.010   (Normal Range: 1.003-1.035) Blood: negative   (Normal Range: Negative) pH: 7.5   (Normal Range: 5.0-8.0) Protein: 30   (Normal Range: Negative) Urobilinogen: 1.0   (Normal Range: 0-1) Nitrite: negative   (Normal Range: Negative) Leukocyte Esterace: negative    (Normal Range: Negative)      Wet Mount/KOH  Other Tests  Rapid HIV: negative    Prevention & Chronic Care Immunizations   Influenza vaccine: Fluvax 3+  (11/30/2009)    Tetanus booster: 04/29/2008: Tdap    Pneumococcal vaccine: Not documented  Other Screening   Pap smear: normal  (04/30/2008)   Smoking status: never  (11/30/2009)  Lipids   Total Cholesterol: Not documented   LDL: Not documented   LDL Direct: Not documented   HDL: Not documented   Triglycerides: Not documented

## 2010-12-23 NOTE — Progress Notes (Signed)
Summary: CALL AFTER CYCLE  Phone Note Call from Patient Call back at Home Phone [phone]   Reason for Call: Talk to Nurse Summary of Call: Katelyn Hardin WAS TOLD TO CALL 3 DAYS AFTER HER CYCLE TO SEE IF THERE WAS A CHANGE AND SHE SASY ITS NOT MUCH OF A CHANGE, JUST A LITTLE, BUT ITS STILL THERE. Initial call taken by: Leodis Rains,  October 19, 2010 2:52 PM  Follow-up for Phone Call        spoke with pt and she says there is still a slight odor there and discharge . she went off her cycle on Friday and has bee using the medicine every night. Follow-up by: Levon Hedger,  October 19, 2010 3:16 PM  Additional Follow-up for Phone Call Additional follow up Details #1::        instructions should have been to WAIT for 3 days after cycle then if symptoms of discharge and odor restart then to use the suppository. up to 3 days following cycle it is not uncommon to have d/c odor so can't really assess. since she has already started using suppository then she can use for total of 5 nights then reassess. Using standard hygiene practices if beneficial Additional Follow-up by: Lehman Prom FNP,  October 19, 2010 3:30 PM    Additional Follow-up for Phone Call Additional follow up Details #2::    pt informed of above information. Follow-up by: Levon Hedger,  October 19, 2010 3:45 PM

## 2010-12-23 NOTE — Progress Notes (Signed)
Summary: WAS TOLD TO CALL YOU BACK  Phone Note Call from Patient Call back at Home Phone [phone]   Summary of Call: Katelyn Hardin SAYS SHE WAS TOLD TO CALL BACK IF THERE HAS BEEN A BIG CHANGE AND SHE SAYS IT REALLY HASN'T. Initial call taken by: Leodis Rains,  October 27, 2010 8:24 AM  Follow-up for Phone Call        spoke with pt and she said the symptoms is not all gone and she has waited five days... pt says she is having the discharge and light odor.. pt says the odor is not that strong but it is still there and she is still having itching... pt says this Saturday is the first time she had sex but she used a condom... she said monday she had sex and did not use condom and thats when she smelled the odor more... pt says she want you to call her in something.. pt says she knows her body and she will waste money keep coming  up for the same thing if the problem is the same... [place] [facility].... but if its cheap $4 she will go to [facility] [address].. Follow-up by: Arm[place] Shannon,  October 27, 2010 4:51 PM  Additional Follow-up for Phone Call Additional follow up Details #1::        If she knows her body then trial and error shows her symptoms occur when she does not use a condom. Perhaps she has an incompatibility with her partner. will treat again and then if problem returns perhaps she should be referred to GYN for their opinion. there is a GYN clinic at [facility] on the 4th wednesday of the month.  Will order her vaginal cream this time - she needs the cream to get precisely to affected area.  Rx sent to [facility] pharmacy Additional Follow-up by: Lehman Prom FNP,  October 27, 2010 5:32 PM    Additional Follow-up for Phone Call Additional follow up Details #2::    pt says she still will to like gyn clinic.Marland KitchenMarland KitchenArmenia Shannon  October 28, 2010 9:48 AM   New/Updated Medications: METROGEL-VAGINAL 0.75 % GEL (METRONIDAZOLE) One application intravaginally at  night Prescriptions: METROGEL-VAGINAL 0.75 % GEL (METRONIDAZOLE) One application intravaginally at night  #45gm x 0   Entered and Authorized by:   Lehman Prom FNP   Signed by:   Lehman Prom FNP on 10/27/2010   Method used:   Faxed to ...       [facility] (retail)       [address] [place], [place]  [address]       Ph: [phone]       Fax: [phone]   RxID:   [id number]

## 2010-12-23 NOTE — Progress Notes (Signed)
Summary: RX YESTERDAY WAS TOO EXPENSIVE  Phone Note Call from Patient Call back at Home Phone [phone]   Summary of Call: Katelyn Hardin PT. MS MCADOO CALLED AND SAYS THAT THE RX YOU GAVE HER YESTERDAY (CLINDAMYCIN) SHE TOOK TO [facility] AND IT WAS $40, SO SHE WANTS TO KNOW IF YOU CAN CALL IT INTO [facility]. SHE CALLED THEM, AND WAS TOLD THEY HAVE IT. Initial call taken by: Leodis Rains,  December 01, 2009 10:50 AM  Follow-up for Phone Call        Clindamycin called to [facility], pt notified Follow-up by: [name] RN,  December 01, 2009 11:02 AM

## 2010-12-23 NOTE — Progress Notes (Signed)
Summary: VITAMIN D IS LOW  Phone Note Call from Patient Call back at Home Phone [phone] Call back at [phone]   Summary of Call: [name] MS MCADOO SAYS THAT SHE THINKS HER VITAMIN D IS LOW, BECAUSE SHE IS FEELING VERY TIRED AND SLUGGISH. SHE WANTS TO KNOW IF SHE CAN COME AND JUST GET HER LABS DONE TO SEE IF IT IS LOW. Initial call taken by: Leodis Rains,  September 08, 2010 9:39 AM  Follow-up for Phone Call        Sent to N. Daphine Deutscher. Dutch Quint RN  September 08, 2010 11:09 AM   Additional Follow-up for Phone Call Additional follow up Details #1::        ok to schedule a lab visit - vitamin D and cbc Additional Follow-up by: Lehman Prom FNP,  September 08, 2010 12:19 PM    Additional Follow-up for Phone Call Additional follow up Details #2::    Appt. 09/10/10.  Dutch Quint RN  September 09, 2010 4:09 PM

## 2010-12-23 NOTE — Letter (Signed)
Summary: *HSN Results Follow up  [facility]  [address] [place], [place] [address]   Phone: [phone]  Fax: [phone]      12/02/2009   Reita May MCADOO [address] [place], [place]  [address]   Dear  Ms. Lillyahna MCADOO,                            ____S.Drinkard,FNP   ____D. Gore,FNP       ____B. McPherson,MD   ____V. Rankins,MD    ____E. Mulberry,MD    _X___N. Daphine Deutscher, FNP  ____D. Reche Dixon, MD    ____K. Philipp Deputy, MD    ____Other     This letter is to inform you that your recent test(s):  ____X___Pap Smear    _______Lab Test     _______X-ray    __X_____ is within acceptable limits  _______ requires a medication change  _______ requires a follow-up lab visit  _______ requires a follow-up visit with your provider   Comments: Pap Smear results are ok.    You should have spoken with this office about your vitamin D level and blood count.  If you have NOT by the time you recieve this letter then please call the office.     _________________________________________________________ If you have any questions, please contact our office [phone].                    Sincerely,    Lehman Prom FNP [facility]

## 2010-12-23 NOTE — Progress Notes (Signed)
Summary: TEST RESULT  Phone Note Call from Patient Call back at [phone]   Summary of Call: PATIENT IS CALLING TO [facility] ABOUT HER TEST RESULTS PLEASE CALL HER AT [phone] Initial call taken by: Domenic Polite,  October 13, 2010 8:47 AM  Follow-up for Phone Call        forward to N. Daphine Deutscher, fnp Follow-up by: Levon Hedger,  October 13, 2010 9:33 AM  Additional Follow-up for Phone Call Additional follow up Details #1::        pt has not been here in over 1 month for lab - is she taking about last month's lab visit letter sent - you can read info on letter if she did not receive Additional Follow-up by: Lehman Prom FNP,  October 13, 2010 10:31 AM    Additional Follow-up for Phone Call Additional follow up Details #2::    pt says she is not calling for test results but to let the provider know that she had some itching in her vagina...  the next morning she had a ordor.... pt says she feels as if she has BV again... pt says she has been having BV back to back.... pt wants to know if there is something she can take on a daily basis... pt says she is fraustrated and she doesnt know what to do... pt says she is wiping from front to back and everything else she can do... pt says she is on her cycle now.... pt says she has appt with you in Jan. but she can not wait til Jan. to get help for this.... pt says this is interfering with her life.... pt says it seems this is happening after sex and she gets up and wash and pee after sex.... pt says she went to urgent care in Oct. the provider told her to stop the probiotic pills but she says when she was on them they was working well but she stop because the provider at urgent care told her too.... Arm[place] Shannon  October 13, 2010 11:13 AM    Additional Follow-up for Phone Call Additional follow up Details #3:: Details for Additional Follow-up Action Taken: Tell her to start probiotic.  That is NOT causing vaginal problems.  it was also  working well for her stomach. There is something that she can take intermittently when she has BV symptoms. It's actually vaginally suppository and the pharmacy has to make it so she may have to get it from [facility] pharmacy [place] like [facility] [place], [facility] does not have it - because they do not mix meds) if she wants to try it I can prescribe it and send to [facility] - it is a boric acid suppository. That way she can tell me know she is doing in January at next scheduled appt n.martin,fnp October 13, 2010  11:41 AM   pt wants to know if she can use a generic brand for a probiotic she can use since the rephresh cost $30..pt says she knows she has a BV now are you going to call her something in.Marland KitchenMarland KitchenMarland KitchenArmenia Shannon  October 13, 2010 12:00 PM  Yes, pt can use generic probiotic.  Pt uses [facility].  they may be a compound pharmacy - which means they make meds on site.  Will fax there since she uses them already.  If for some reason they won't do it ask pt to tell them to transfer to [facility] pharmacy n.martin, fnp  October 13, 2010  3:12 PM  pt is aware and she knows to use the prescribe med and to get probiotic med....Marland KitchenMarland KitchenArmenia Shannon  October 13, 2010 3:21 PM   Prescriptions: BORIC ACID 600MG  1 suppository intravaginally nightly x 2 weeks  #14 x 1   Entered and Authorized by:   Lehman Prom FNP   Signed by:   Lehman Prom FNP on 10/13/2010   Method used:   Printed then faxed to ...       [facility] (retail)       [address]       [place], [place]  [id number]       Ph: [phone]       Fax: [phone]   RxID:   [id number]

## 2010-12-23 NOTE — Progress Notes (Signed)
Summary: Office Visit/DEPRESSION SCREENING  Office Visit/DEPRESSION SCREENING   Imported By: Arta Bruce 07/21/2010 14:21:55  _____________________________________________________________________  External Attachment:    Type:   Image     Comment:   External Document

## 2010-12-23 NOTE — Letter (Signed)
Summary: Handout Printed  Printed Handout:  - Depression-Brief 

## 2010-12-23 NOTE — Assessment & Plan Note (Signed)
Summary: HTN/Depression   Vital Signs:  Patient profile:   40 year old female LMP:     07/01/2010 Weight:      167.7 pounds BMI:     26.36 Temp:     99.8 degrees F oral Pulse rate:   80 / minute Pulse rhythm:   regular Resp:     20 per minute BP sitting:   140 / 110  (left arm) Cuff size:   regular  Vitals Entered By: Levon Hedger (July 21, 2010 10:58 AM)  Nutrition Counseling: Patient's BMI is greater than 25 and therefore counseled on weight management options. CC: Bp issues...feeling extremely tired, headaches daily...wants to ask some questions on getting a stress test done, Depression, Hypertension Management Is Patient Diabetic? No Pain Assessment Patient in pain? no       Does patient need assistance? Functional Status Self care Ambulation Normal LMP (date): 07/01/2010 LMP - Character: normal     Enter LMP: 07/01/2010 Last PAP Result  Specimen Adequacy: Satisfactory for evaluation.   Interpretation/Result:Negative for intraepithelial Lesion or Malignancy.      CC:  Bp issues...feeling extremely tired, headaches daily...wants to ask some questions on getting a stress test done, Depression, and Hypertension Management.  History of Present Illness:  Pt into the office to f/u on blood pressure She went to urgent care a few weeks ago and blood pressure was elevated she was advised to start checking her blood pressure at home and it has been elevated no previous blood pressure medications  Stress - Pt has been under lots of stress lately  Depression History:      The patient presents with symptoms of depression which have been present for greater than two weeks.  She notes that the symptoms started approximately 04/21/2010.  The patient notes a diminished interest in her usual daily activities.  Positive alarm features for depression include insomnia and impaired concentration (indecisiveness).  However, she denies recurrent thoughts of death or suicide.       Psychosocial stress factors include major life changes.  The patient denies that she feels like life is not worth living, denies that she wishes that she were dead, and denies that she has thought about ending her life.        Comments:  Pt has taken some OTC meds - advil PM for sleep.  Hypertension History:      She denies headache, chest pain, and palpitations.  She notes no problems with any antihypertensive medication side effects.  124/95, 149/101, 127/88, 127/93, 137/104.  Further comments include: Pt brings her blood pressure machine into the office.        Positive major cardiovascular risk factors include hypertension.  Negative major cardiovascular risk factors include female age less than 15 years old, no history of diabetes or hyperlipidemia, negative family history for ischemic heart disease, and non-tobacco-user status.        Further assessment for target organ damage reveals no history of ASHD, cardiac end-organ damage (CHF/LVH), stroke/TIA, peripheral vascular disease, renal insufficiency, or hypertensive retinopathy.      Habits & Providers  Alcohol-Tobacco-Diet     Alcohol drinks/day: <1     Alcohol Counseling: not indicated; use of alcohol is not excessive or problematic     Alcohol type: wine     Tobacco Status: never  Exercise-Depression-Behavior     Does Patient Exercise: no     Have you felt down or hopeless? yes     Have you felt little pleasure in  things? yes     Depression Counseling: not indicated; screening negative for depression     Drug Use: no     Seat Belt Use: 100     Sun Exposure: occasionally  Comments: Pt has a treadmill at home but does not use it PHQ-9 score = 18  Allergies (verified): 1)  ! Tylenol  Review of Systems General:  Complains of fatigue; denies fever. CV:  Complains of palpitations; denies chest pain or discomfort. Resp:  Denies cough. GI:  Denies abdominal pain. Psych:  Complains of depression.  Physical Exam  General:   alert.   Head:  normocephalic.   Ears:  ear piercing(s) noted.   Lungs:  normal breath sounds.   Heart:  normal rate and regular rhythm.   Msk:  up to the exam  Neurologic:  alert & oriented X3.   Skin:  color normal.   Psych:  Oriented X3.     Impression & Recommendations:  Problem # 1:  DEPRESSION (ICD-311) handout given lexapro samples  PHQ-9 score = 18 Her updated medication list for this problem includes:    Lexapro 10 Mg Tabs (Escitalopram oxalate) ..... One tablet by mouth daily for mood  Problem # 2:  HYPERTENSION, BENIGN ESSENTIAL (ICD-401.1)  advised pt of lifestyle modifications will start low dose diuretic  Her updated medication list for this problem includes:    Hydrochlorothiazide 25 Mg Tabs (Hydrochlorothiazide) .Marland Kitchen... Take one (1) by mouth daily  Complete Medication List: 1)  Boric Acid 600mg   .... 1 suppository intravaginally nightly x 2 weeks 2)  Desonide 0.05 % Oint (Desonide) .Marland Kitchen.. 1 application topically to affected area  two times a day as needed 3)  Ferrous Sulfate 325 (65 Fe) Mg Tbec (Ferrous sulfate) .... One tablet by mouth daily 4)  Betasept Surgical Scrub 4 % Liqd (Chlorhexidine gluconate) .... One application topically to affected area, let sit for 5 mintues then rinse.  use three times per week 5)  Hydrochlorothiazide 25 Mg Tabs (Hydrochlorothiazide) .... Take one (1) by mouth daily 6)  Lexapro 10 Mg Tabs (Escitalopram oxalate) .... One tablet by mouth daily for mood  Hypertension Assessment/Plan:      The patient's hypertensive risk group is category A: No risk factors and no target organ damage.  Her calculated 10 year risk of coronary heart disease is 3 %.  Today's blood pressure is 140/110.  Her blood pressure goal is < 140/90.  Patient Instructions: 1)  Blood pressure - Stage I 2)  Read the handout and make some lifestyle changes 3)  Start HCTZ 25mg  by mouth daily 4)  Be sure to decrease sodium in your diet 5)  Keep checking your blood  pressure and record.  Be sure that heart is at heart level when you check. 6)  Depression - read handout 7)  start lexapro 10mg  by mouth daily 8)  This will help to improve your mood and sleep pattern 9)  YOU MUST FIND TIME FOR EXERCISE 10)  Schedule this in your routine.  This may actually allow time for relaxation 11)  Follow up in 4 weeks with n.martin,fnp for mood and blood pressure.  Med review - lexapro Prescriptions: LEXAPRO 10 MG TABS (ESCITALOPRAM OXALATE) One tablet by mouth daily for mood  #28 x 0   Entered and Authorized by:   Lehman Prom FNP   Signed by:   Lehman Prom FNP on 07/21/2010   Method used:   Samples Given   RxID:   [id number] HYDROCHLOROTHIAZIDE  25 MG TABS (HYDROCHLOROTHIAZIDE) Take one (1) by mouth daily  #30 x 3   Entered and Authorized by:   Lehman Prom FNP   Signed by:   Lehman Prom FNP on 07/21/2010   Method used:   Print then Give to Patient   RxID:   [id number]

## 2010-12-24 NOTE — Letter (Signed)
Summary: BLOOD PRESSURE READINGS  BLOOD PRESSURE READINGS   Imported By: Arta Bruce 07/22/2010 11:45:24  _____________________________________________________________________  External Attachment:    Type:   Image     Comment:   External Document

## 2011-01-05 ENCOUNTER — Inpatient Hospital Stay (INDEPENDENT_AMBULATORY_CARE_PROVIDER_SITE_OTHER)
Admission: RE | Admit: 2011-01-05 | Discharge: 2011-01-05 | Disposition: A | Discharge status: Home / Self Care | Payer: Self-pay | Source: Ambulatory Visit | Attending: Family Medicine | Admitting: Family Medicine

## 2011-01-05 ENCOUNTER — Ambulatory Visit (INDEPENDENT_AMBULATORY_CARE_PROVIDER_SITE_OTHER): Payer: Self-pay

## 2011-01-05 DIAGNOSIS — B373 Candidiasis of vulva and vagina: Secondary | ICD-10-CM

## 2011-01-05 DIAGNOSIS — K5900 Constipation, unspecified: Secondary | ICD-10-CM

## 2011-01-05 LAB — WET PREP, GENITAL
Trich, Wet Prep: NONE SEEN
WBC, Wet Prep HPF POC: NONE SEEN
Yeast Wet Prep HPF POC: NONE SEEN

## 2011-01-05 LAB — POCT URINALYSIS DIPSTICK
Nitrite: NEGATIVE
Protein, ur: NEGATIVE mg/dL
Specific Gravity, Urine: >=1.03 (ref 1.005–1.030)
Urine Glucose, Fasting: NEGATIVE mg/dL
Urobilinogen, UA: 0.2 mg/dL (ref 0.0–1.0)
pH: 5.5 (ref 5.0–8.0)

## 2011-01-05 LAB — POCT PREGNANCY, URINE: Preg Test, Ur: NEGATIVE

## 2011-01-06 LAB — GC/CHLAMYDIA PROBE AMP, GENITAL
Chlamydia, DNA Probe: NEGATIVE
GC Probe Amp, Genital: NEGATIVE

## 2011-01-18 ENCOUNTER — Telehealth (INDEPENDENT_AMBULATORY_CARE_PROVIDER_SITE_OTHER): Payer: Self-pay | Admitting: Nurse Practitioner

## 2011-01-31 LAB — POCT URINALYSIS DIPSTICK
Bilirubin Urine: NEGATIVE
Glucose, UA: NEGATIVE mg/dL
Hgb urine dipstick: NEGATIVE
Ketones, ur: NEGATIVE mg/dL
Nitrite: NEGATIVE
Protein, ur: NEGATIVE mg/dL
Specific Gravity, Urine: 1.02 (ref 1.005–1.030)
Urobilinogen, UA: 1 mg/dL (ref 0.0–1.0)
pH: 7 (ref 5.0–8.0)

## 2011-01-31 LAB — WET PREP, GENITAL: Trich, Wet Prep: NONE SEEN

## 2011-01-31 LAB — GC/CHLAMYDIA PROBE AMP, GENITAL
Chlamydia, DNA Probe: NEGATIVE
GC Probe Amp, Genital: NEGATIVE

## 2011-02-02 LAB — POCT URINALYSIS DIPSTICK
Bilirubin Urine: NEGATIVE
Glucose, UA: NEGATIVE mg/dL
Nitrite: NEGATIVE
Protein, ur: NEGATIVE mg/dL
Specific Gravity, Urine: 1.025 (ref 1.005–1.030)
Urobilinogen, UA: 1 mg/dL (ref 0.0–1.0)
pH: 5.5 (ref 5.0–8.0)

## 2011-02-02 LAB — POCT PREGNANCY, URINE: Preg Test, Ur: NEGATIVE

## 2011-02-02 LAB — WET PREP, GENITAL: Trich, Wet Prep: NONE SEEN

## 2011-02-02 LAB — GC/CHLAMYDIA PROBE AMP, GENITAL: Chlamydia, DNA Probe: NEGATIVE

## 2011-02-04 LAB — WET PREP, GENITAL: Yeast Wet Prep HPF POC: NONE SEEN

## 2011-02-04 LAB — URINE CULTURE
Colony Count: NO GROWTH
Culture  Setup Time: 201108061756
Culture: NO GROWTH

## 2011-02-04 LAB — POCT URINALYSIS DIPSTICK
Glucose, UA: NEGATIVE mg/dL
Ketones, ur: NEGATIVE mg/dL
Specific Gravity, Urine: >=1.03 (ref 1.005–1.030)
Urobilinogen, UA: 1 mg/dL (ref 0.0–1.0)

## 2011-02-04 LAB — POCT PREGNANCY, URINE: Preg Test, Ur: NEGATIVE

## 2011-02-04 LAB — GC/CHLAMYDIA PROBE AMP, GENITAL: Chlamydia, DNA Probe: NEGATIVE

## 2011-02-08 LAB — WET PREP, GENITAL

## 2011-02-08 LAB — GC/CHLAMYDIA PROBE AMP, GENITAL: Chlamydia, DNA Probe: NEGATIVE

## 2011-02-08 NOTE — Progress Notes (Signed)
Summary: MEDICATION ISSUE  Phone Note Call from Patient Call back at [phone]   Reason for Call: Talk to Nurse Summary of Call: [name] MS MCADOO STOPPED IN THIS MORNING AND SAYS THAT SHE SAW DR MCCOMB TO DISCUSS THE ISSUES SHE IS HAVING WITH THE ON GOING MEDICATION HE PRESCRIBED. HE TOLD HER THAT IF SHE HAD ANY PROBLEMS TO CONTACT HIM AND WHEN SHE DID CALL HIS OFFICE, THEY WOULDN'T TAKE HER MESSAGE AND WAS TOLD TO CALL HERE. Initial call taken by: Leodis Rains,  January 18, 2011 10:40 AM  Follow-up for Phone Call        R/C - no answer.  Sharen Heck RN  January 19, 2011  11:40 AM  Our office needs to contact Dr. Lisbeth Ply office since she is a [facility] pt. and he doesn't have any of his records.  He is a Agricultural consultant in the GYN clinic.  He prescribed metro-gel but it seems to be giving her a yeast infection -- gel won't dissolve, turns like cottage-cheese, white.  Would like to know if it comes in a pill form. Is taking 7 days a week per his orders.  Would like Dr. Lisbeth Ply office to call her after he receives her records.  Dutch Quint RN  January 20, 2011 5:58 PM   Additional Follow-up for Phone Call Additional follow up Details #1::        I'm a bit confused -  Is medical records sending her records to GYN?  If she was seen at GYN clinic then this is likely a scanned visit or paper since they do not document in EMR at the GYN clinic. Pt can always be seen again here at GYN clinic at next available appt to see that provider.  The reason I referred to GYN was because she continued to have problems and if she is STILL having problems then to me it would make sense to be seen by that provider. Additional Follow-up by: Lehman Prom FNP,  January 21, 2011 12:21 PM    Additional Follow-up for Phone Call Additional follow up Details #2::    Left message on answering machine for pt. to return call.  Dutch Quint RN  January 24, 2011 2:33 PM  Left message on answering machine for pt. to  return call.  Dutch Quint RN  January 25, 2011 12:44 PM  States her orange card has expired since she wasn't told that she needed a certain form notarized form, so she can't be seen until that's renewed.  In the mean time, she wanted to talk to Dr. Arelia Sneddon to ask him to change her Rx to pills instead of the gel,  because the pills are more effective, and the gel seems to give her a yeast infection, which she is trying to clear up.  Can you change her Rx, or is there a way to talk directly to Dr. Gaye Alken? See me if you don't understand this.   Dutch Quint RN  January 25, 2011 4:02 PM    Additional Follow-up for Phone Call Additional follow up Details #3:: Details for Additional Follow-up Action Taken: ok to call metronidazole as ordered (see med list)  into pt's pharmacy n.martin,fnp January 26, 2011  9:11 AM  Left message on answering machine for pt. to return call.  Dutch Quint RN  January 26, 2011 9:42 AM  Spoke with pt. at length re this medication.  Per Jesse Fall, pt. to take 2 tabs  of metronidazole on Sunday, and one tab to be taken after sexual activity.  This is a trial to see how well it works for pt.   After she gets her orange card renewed, we can send her back to GYN for f/u.  Left message on answering machine for pt. to return call.  New Rx sent to [facility].  Theresa Laib RN  January 26, 2011 11:28 AM  Pt. advised of new Rx and instructions for use.  Wants to know for clarity -- if she has sexual relations more than once per week, does she take another pill for each session?  Or just one additional tablet per week?  Theresa Laib RN  January 27, 2011 12:00 PM yes, just as she will take another pill.  Weekly dose is 2 tablets. preventative dose after sexual activity is 1 tablet n.martin,fnp January 27, 2011  12:39 PM  Pt. advised of instructions -- verbalized understanding and agreement.  Theresa Laib RN  January 31, 2011 12:05 PM     New/Updated Medications: METRONIDAZOLE 500 MG TABS  (METRONIDAZOLE) One tablet by mouth daily Prescriptions: METRONIDAZOLE 500 MG TABS (METRONIDAZOLE) One tablet by mouth daily  #30 x 0   Entered by:   Theresa Laib RN   Authorized by:   Jennings Stirling Martin FNP   Signed by:   Theresa Laib RN on 01/26/2011   Method used:   Faxed to ...       [facility] (retail)       [address]       [place], [place]  [address]       Ph: [phone]       Fax: [phone]   RxID:   [id number] METRONIDAZOLE 500 MG TABS (METRONIDAZOLE) One tablet by mouth two times a day  #14 x 0   Entered and Authorized by:   Ashantee Deupree Martin FNP   Signed by:   Theresa Laib RN on 01/26/2011   Method used:   Telephoned to ...       [facility]* (retail)       [address]       [place], [place]  [id number]       Ph: [phone]       Fax: [phone]   RxID:   [id number]

## 2011-02-11 ENCOUNTER — Encounter (INDEPENDENT_AMBULATORY_CARE_PROVIDER_SITE_OTHER): Payer: Self-pay | Admitting: Nurse Practitioner

## 2011-02-14 ENCOUNTER — Encounter (INDEPENDENT_AMBULATORY_CARE_PROVIDER_SITE_OTHER): Payer: Self-pay | Admitting: Nurse Practitioner

## 2011-02-17 NOTE — Assessment & Plan Note (Signed)
Summary: TB shot for employment  Nurse Visit  CC: Pt. is coming on 02/14/11 to have PPD read at 8:30am   Allergies: 1)  ! Tylenol  Immunizations Administered:  PPD Skin Test:    Vaccine Type: PPD    Site: right forearm    Mfr: Sanofi Pasteur    Dose: 0.1 ml    Route: ID    Given by: Hale Drone CMA    Exp. Date: 06/02/2012    Lot #: [id number]  Orders Added: 1)  Est. Patient Nurse visit [09003] 2)  TB Skin Test [86580] 3)  Admin 1st Vaccine [04540]

## 2011-02-22 NOTE — Assessment & Plan Note (Signed)
Summary: PPD result  Nurse Visit   Allergies: 1)  ! Tylenol  PPD Results    Date of reading: 02/14/2011    Results: < 5mm    Interpretation: negative

## 2011-03-08 LAB — HIV ANTIBODY (ROUTINE TESTING W REFLEX): HIV: NONREACTIVE

## 2011-03-08 LAB — COMPREHENSIVE METABOLIC PANEL
ALT: 20 U/L (ref 0–35)
Alkaline Phosphatase: 73 U/L (ref 39–117)
BUN: 6 mg/dL (ref 6–23)
Chloride: 108 mEq/L (ref 96–112)
Glucose, Bld: 94 mg/dL (ref 70–99)
Potassium: 4.2 mEq/L (ref 3.5–5.1)
Sodium: 138 mEq/L (ref 135–145)
Total Bilirubin: 0.8 mg/dL (ref 0.3–1.2)
Total Protein: 7.7 g/dL (ref 6.0–8.3)

## 2011-03-08 LAB — CBC
HCT: 39 % (ref 36.0–46.0)
Hemoglobin: 13.3 g/dL (ref 12.0–15.0)
RBC: 3.99 MIL/uL (ref 3.87–5.11)
RDW: 13.2 % (ref 11.5–15.5)
WBC: 4.1 10*3/uL (ref 4.0–10.5)

## 2011-03-08 LAB — POCT URINALYSIS DIP (DEVICE)
Glucose, UA: NEGATIVE mg/dL
Ketones, ur: NEGATIVE mg/dL
Nitrite: NEGATIVE
pH: 5.5 (ref 5.0–8.0)

## 2011-03-08 LAB — DIFFERENTIAL
Basophils Absolute: 0 10*3/uL (ref 0.0–0.1)
Basophils Relative: 1 % (ref 0–1)
Eosinophils Absolute: 0 10*3/uL (ref 0.0–0.7)
Monocytes Absolute: 0.2 10*3/uL (ref 0.1–1.0)
Monocytes Relative: 5 % (ref 3–12)
Neutro Abs: 2 10*3/uL (ref 1.7–7.7)
Neutrophils Relative %: 48 % (ref 43–77)

## 2011-03-08 LAB — WET PREP, GENITAL: Trich, Wet Prep: NONE SEEN

## 2011-03-08 LAB — POCT PREGNANCY, URINE: Preg Test, Ur: NEGATIVE

## 2011-04-08 NOTE — H&P (Signed)
[facility]  Patient:    Katelyn Hardin, Katelyn Hardin Visit Number: [id number] MRN: [id number]          Service Type: DSU Location: [facility] Attending Physician:  Wandalee Ferdinand Dictated by:   Rudy Jew Ashley Royalty, M.D. Admit Date:  05/15/2002                           History and Physical  HISTORY OF PRESENT ILLNESS:   This is a 40 year old gravida 3, para 2, Ab1, who presents stating a desire for attempt at permanent surgical sterilization. She has an IUD in place which she would like removed as well.  MEDICATIONS:                  None.  PAST MEDICAL HISTORY:         Negative.  PAST SURGICAL HISTORY:        Negative.  ALLERGIES:                    CODEINE.  FAMILY HISTORY:               Positive for diabetes and breast cancer.  SOCIAL HISTORY:               The patient denies the use of tobacco or significant alcohol.  REVIEW OF SYSTEMS:            Noncontributory.  PHYSICAL EXAMINATION:  GENERAL:                      Well-developed, well-nourished, pleasant white female in no acute distress.  VITAL SIGNS:                  Afebrile.  Vital signs stable.  SKIN:                         Warm and dry without lesions.  LYMPH:                        There is no supraclavicular, cervical or inguinal adenopathy.  HEENT:                        Normocephalic.  NECK:                         Supple without thyromegaly.  CHEST:                        Lungs are clear.  CARDIAC:                      Regular rate and rhythm, without murmurs, gallops or rubs.  ABDOMEN:                      Soft and nontender, without masses or organomegaly.  PELVIC:                       Examination is deferred until examination under anesthesia.  IMPRESSION: 1. Desire for attempt at permanent surgical sterilization. 2. Intrauterine device present in the uterus with a string visible -- the    patient desires removal.  PLAN: 1. Laparoscopic bilateral tubal  sterilization procedure. 2. Removal of IUD.  Risks, benefits, complications and alternatives were fully  discussed with the patient.  The permanency and failure rates of various techniques including but not limited to Falope ring, bipolar cautery, minilaparotomy with partial salpingectomy discussed and accepted.  Questions invited and answered. Dictated by:   Rudy Jew Ashley Royalty, M.D. Attending Physician:  Wandalee Ferdinand DD:  05/15/02 TD:  05/15/02 Job: [id number]

## 2011-04-08 NOTE — Assessment & Plan Note (Signed)
[facility]                           GASTROENTEROLOGY OFFICE NOTE   ELLEY, HARP                       MRN:          [id number]  DATE:06/12/2006                            DOB:          06/19/1971    PROBLEM:  Abdominal distention and constipation.   Ms. Chelsea Aus has returned with similar complaints from her previous office  visit in 09/2003.  She has constipation and may go up to five days without a  spontaneous bowel movement.  With fiber, she moves her bowels more  frequently, though she still has abdominal  bloating and distention.  In  10/2003, upper endoscopy was pertinent for retained gastric contents.  Gastric emptying scan was abnormal demonstrating delayed gastric emptying.  She currently is on no medications.   PHYSICAL EXAMINATION:  VITAL SIGNS:  Blood pressure 114/72, weight 171.  HEENT: EOMI. PERRLA. Sclerae are anicteric.  Conjunctivae are pink.  NECK:  Supple without thyromegaly, adenopathy or carotid bruits.  CHEST:  Clear to auscultation and percussion without adventitious sounds.  CARDIAC:  Regular rhythm; normal S1 S2.  There are no murmurs, gallops or  rubs.  ABDOMEN:  Bowel sounds are normoactive.  Abdomen is soft, non-tender and non-  distended.  There are no abdominal masses, tenderness, splenic enlargement  or hepatomegaly.  There is no succussion splash.  EXTREMITIES:  Full range of motion.  No cyanosis, clubbing or edema.  RECTAL:  Deferred.   IMPRESSION:  1.  Idiopathic gastroparesis.  2.  Constipation - likely secondary to irritable bowel syndrome.  She may      have a generalized hypomotility disorder.   RECOMMENDATION:  The patient will consider enrollment in an IBS trial.  Feeling that I would place her again on Reglan.                                   Barbette Hair. Arlyce Dice, MD, [facility]   RDK/MedQ  DD:  06/12/2006  DT:  06/12/2006  Job #:  [id number]   cc:   Dineen Kid. Reche Dixon, MD  Roseanna Rainbow, MD

## 2011-04-08 NOTE — Op Note (Signed)
[facility]  Patient:    Katelyn Hardin, Katelyn Hardin Visit Number: [id number] MRN: [id number]          Service Type: DSU Location: [facility] Attending Physician:  Wandalee Ferdinand Dictated by:   Rudy Jew Ashley Royalty, M.D. Proc. Date: 05/15/02 Admit Date:  05/15/2002 Discharge Date: 05/15/2002                             Operative Report  PREOPERATIVE DIAGNOSIS:       Desire for attempt at permanent surgical sterilization.  POSTOPERATIVE DIAGNOSIS:      Desire for attempt at permanent surgical sterilization.  OPERATION:                    Laparoscopic bilateral tubal sterilization (Falope rings).  SURGEON:                      Rudy Jew. Ashley Royalty, M.D.  ANESTHESIA:                   General.  ESTIMATED BLOOD LOSS:         Less than 25 cc.  COMPLICATIONS:                None.  PACKS AND DRAINS:             None.  DESCRIPTION OF PROCEDURE:     The patient was taken to the operating room and placed in the dorsal supine position. After adequate general anesthesia was administered, she was placed in the lithotomy position and prepped and draped in the usual manner for abdominal and vaginal surgery. A posterior weighted retractor was placed per vagina. The anterior lip of the cervix was grasped with a single-tooth tenaculum. Jarcho uterine manipulator was placed per cervix and held in place with a single-tooth tenaculum. The bladder was drained with a red rubber catheter. Next, a 1.3 cm infraumbilical incision was made in the longitudinal plane. The size 1011 disposable laparoscopic trocar was introduced into the abdominal cavity. Its location was verified by placement of the laparoscope. There was no evidence of any trauma. Next, the 7-8 mm Falope ring trocar was placed suprapubically in the midline using transillumination and direct visualization techniques. The pelvis was then thoroughly surveyed. The uterus was normal size, shape, and contour. The ovaries  were normal size, shape, and contour without evidence of any cysts or any endometriosis. The fallopian tubes were normal size, shape, and contour and lined with luxuriant fimbriae. The remainder of the peritoneal surfaces were smooth and glistening. The left fallopian tube was grasped with the applicator and traced to its fimbriated end. A avascular area in the distal isthmic portion was chosen for ring placement. A Falope ring was applied without difficulty. An excellent knuckle of tube was noted to be contained within the ring. Excellent blanching of tissue was noted. The right fallopian tube was then grasped and traced to its fimbriated end. An avascular area in the distal isthmic portion was chosen for ring placement. A Falope-ring was applied without difficulty. An excellent knuckle of tube was noted to be contained within the ring. Excellent blanching of tissue was noted. Appropriate ______ were obtained. Hemostasis was noted.  The abdominal instruments removed and pneumoperitoneum evacuated. Fascial defects were closed with 0 Vicryl in an interrupted fashion. The skin was closed with 3-0 chromic in a subcuticular fashion. The vaginal instruments were removed and hemostasis noted. The procedure  was terminated.  The patient was taken to the recovery room in excellent condition. Dictated by:   Rudy Jew Ashley Royalty, M.D. Attending Physician:  Wandalee Ferdinand DD:  05/15/02 TD:  05/16/02 Job: [id number]

## 2011-07-28 ENCOUNTER — Inpatient Hospital Stay (INDEPENDENT_AMBULATORY_CARE_PROVIDER_SITE_OTHER)
Admission: RE | Admit: 2011-07-28 | Discharge: 2011-07-28 | Disposition: A | Discharge status: Home / Self Care | Payer: Self-pay | Source: Ambulatory Visit | Attending: Family Medicine | Admitting: Family Medicine

## 2011-07-29 ENCOUNTER — Inpatient Hospital Stay (INDEPENDENT_AMBULATORY_CARE_PROVIDER_SITE_OTHER)
Admission: RE | Admit: 2011-07-29 | Discharge: 2011-07-29 | Disposition: A | Discharge status: Home / Self Care | Payer: Self-pay | Source: Ambulatory Visit | Attending: Family Medicine | Admitting: Family Medicine

## 2011-07-29 DIAGNOSIS — L408 Other psoriasis: Secondary | ICD-10-CM

## 2011-08-19 ENCOUNTER — Inpatient Hospital Stay (INDEPENDENT_AMBULATORY_CARE_PROVIDER_SITE_OTHER)
Admission: RE | Admit: 2011-08-19 | Discharge: 2011-08-19 | Disposition: A | Discharge status: Home / Self Care | Payer: Self-pay | Source: Ambulatory Visit | Attending: Family Medicine | Admitting: Family Medicine

## 2011-08-19 DIAGNOSIS — R197 Diarrhea, unspecified: Secondary | ICD-10-CM

## 2011-08-23 LAB — GC/CHLAMYDIA PROBE AMP, GENITAL
Chlamydia, DNA Probe: NEGATIVE
GC Probe Amp, Genital: NEGATIVE

## 2011-08-23 LAB — POCT URINALYSIS DIP (DEVICE)
Glucose, UA: NEGATIVE mg/dL
Operator id: 247071
Protein, ur: 30 mg/dL — AB
Urobilinogen, UA: 2 mg/dL — ABNORMAL HIGH (ref 0.0–1.0)

## 2011-08-23 LAB — POCT PREGNANCY, URINE: Preg Test, Ur: NEGATIVE

## 2011-09-27 ENCOUNTER — Emergency Department (INDEPENDENT_AMBULATORY_CARE_PROVIDER_SITE_OTHER)
Admission: EM | Admit: 2011-09-27 | Discharge: 2011-09-27 | Disposition: A | Discharge status: Home / Self Care | Payer: Self-pay | Source: Home / Self Care | Attending: Emergency Medicine | Admitting: Emergency Medicine

## 2011-09-27 ENCOUNTER — Encounter: Payer: Self-pay | Admitting: Emergency Medicine

## 2011-09-27 DIAGNOSIS — J329 Chronic sinusitis, unspecified: Secondary | ICD-10-CM

## 2011-09-27 DIAGNOSIS — H6120 Impacted cerumen, unspecified ear: Secondary | ICD-10-CM

## 2011-09-27 DIAGNOSIS — R51 Headache: Secondary | ICD-10-CM

## 2011-09-27 MED ORDER — AMOXICILLIN 500 MG PO CAPS: 1000.0000 mg | ORAL_CAPSULE | Freq: Three times a day (TID) | ORAL | Status: AC

## 2011-09-27 MED ORDER — IBUPROFEN 800 MG PO TABS
ORAL_TABLET | ORAL | Status: AC
  Filled 2011-09-27: qty 1

## 2011-09-27 MED ORDER — IBUPROFEN 800 MG PO TABS: 800.0000 mg | ORAL_TABLET | Freq: Three times a day (TID) | ORAL | Status: AC

## 2011-09-27 MED ORDER — IBUPROFEN 800 MG PO TABS
800.0000 mg | ORAL_TABLET | Freq: Once | ORAL | Status: AC
  Administered 2011-09-27: 800 mg via ORAL

## 2011-09-27 NOTE — ED Provider Notes (Signed)
History     CSN: [id number] Arrival date & time: 09/27/2011  8:59 PM   First MD Initiated Contact with Patient 09/27/11 2148      Chief Complaint  Patient presents with  . Otalgia  . Headache    (Consider location/radiation/quality/duration/timing/severity/associated sxs/prior treatment) HPI Comments:  Jireh Vinas do is a 40 year old female who has had a three-day history of headache involving her entire head. It's a steady pain. She feels slight nausea , photophobia and some weakness but no photophobia or vomiting. She doesn't have a history of migraines. She also notes aching in her ears, her eyes hurt, she's had sinus pain and pressure, mucus drainage from her nose, postnasal drainage, and felt hot. She denies any stiff neck, visual symptoms, or neurological complaints.  Patient is a 40 y.o. female presenting with ear pain and headaches.  Otalgia Associated symptoms include headaches. Pertinent negatives include no rhinorrhea, no sore throat and no neck pain.  Headache The primary symptoms include headaches and fever. Primary symptoms do not include dizziness.  The headache is not associated with photophobia, eye pain, neck stiffness or weakness.  Additional symptoms do not include neck stiffness, weakness or photophobia.    History reviewed. No pertinent past medical history.  History reviewed. No pertinent past surgical history.  History reviewed. No pertinent family history.  History  Substance Use Topics  . Smoking status: Not on file  . Smokeless tobacco: Not on file  . Alcohol Use: Not on file    OB History    Grav Para Term Preterm Abortions TAB SAB Ect Mult Living                  Review of Systems  Constitutional: Positive for fever. Negative for chills.  HENT: Positive for ear pain, congestion and postnasal drip. Negative for sore throat, rhinorrhea, neck pain, neck stiffness and sinus pressure.   Eyes: Negative for photophobia, pain, redness and visual  disturbance.  Neurological: Positive for headaches. Negative for dizziness, facial asymmetry, speech difficulty, weakness, light-headedness and numbness.    Allergies  Acetaminophen  Home Medications   Current Outpatient Rx  Name Route Sig Dispense Refill  . HYDROCHLOROTHIAZIDE 25 MG PO TABS Oral Take 25 mg by mouth daily.      . AMOXICILLIN 500 MG PO CAPS Oral Take 2 capsules (1,000 mg total) by mouth 3 (three) times daily. 60 capsule 0  . IBUPROFEN 800 MG PO TABS Oral Take 1 tablet (800 mg total) by mouth 3 (three) times daily. 21 tablet 0    BP 133/85  Pulse 59  Temp(Src) 97.6 F (36.4 C) (Oral)  Resp 14  SpO2 98%  Physical Exam  Nursing note and vitals reviewed. Constitutional: She is oriented to person, place, and time. She appears well-developed and well-nourished. No distress.  HENT:  Head: Normocephalic and atraumatic.  Left Ear: External ear normal.  Nose: Nose normal.  Mouth/Throat: Oropharynx is clear and moist. No oropharyngeal exudate.        There was a cerumen impaction in the right ear canal.  Eyes: Conjunctivae and EOM are normal. Pupils are equal, round, and reactive to light.  Fundoscopic exam:      The right eye shows no exudate, no hemorrhage and no papilledema. The right eye shows venous pulsations.      The left eye shows no exudate, no hemorrhage and no papilledema. The left eye shows venous pulsations. Neck: Normal range of motion. Neck supple.  Lymphadenopathy:    She  has no cervical adenopathy.  Neurological: She is alert and oriented to person, place, and time. She has normal strength and normal reflexes. She displays no tremor. No cranial nerve deficit or sensory deficit. She exhibits normal muscle tone. Coordination and gait normal.  Skin: Skin is warm and dry. No rash noted. She is not diaphoretic.  Psychiatric: She has a normal mood and affect. Her behavior is normal.    ED Course  Procedures (including critical care time)  Labs Reviewed  - No data to display No results found.   1. Sinusitis   2. Cerumen impaction   3. Headache       MDM          Roque Lias, MD 09/27/11 2248

## 2011-09-29 ENCOUNTER — Telehealth (HOSPITAL_COMMUNITY): Payer: Self-pay | Admitting: *Deleted

## 2011-09-30 ENCOUNTER — Telehealth (HOSPITAL_COMMUNITY): Payer: Self-pay | Admitting: *Deleted

## 2011-11-09 ENCOUNTER — Emergency Department (HOSPITAL_COMMUNITY)
Admission: EM | Admit: 2011-11-09 | Discharge: 2011-11-09 | Disposition: A | Discharge status: Home / Self Care | Payer: Self-pay | Attending: Emergency Medicine | Admitting: Emergency Medicine

## 2011-11-09 ENCOUNTER — Encounter (HOSPITAL_COMMUNITY): Payer: Self-pay | Admitting: *Deleted

## 2011-11-09 DIAGNOSIS — R51 Headache: Secondary | ICD-10-CM | POA: Insufficient documentation

## 2011-11-09 DIAGNOSIS — B349 Viral infection, unspecified: Secondary | ICD-10-CM

## 2011-11-09 DIAGNOSIS — B9789 Other viral agents as the cause of diseases classified elsewhere: Secondary | ICD-10-CM | POA: Insufficient documentation

## 2011-11-09 DIAGNOSIS — R112 Nausea with vomiting, unspecified: Secondary | ICD-10-CM | POA: Insufficient documentation

## 2011-11-09 DIAGNOSIS — J069 Acute upper respiratory infection, unspecified: Secondary | ICD-10-CM | POA: Insufficient documentation

## 2011-11-09 MED ORDER — ONDANSETRON 8 MG PO TBDP
8.0000 mg | ORAL_TABLET | Freq: Once | ORAL | Status: AC
  Administered 2011-11-09: 8 mg via ORAL
  Filled 2011-11-09: qty 1

## 2011-11-09 MED ORDER — IBUPROFEN 800 MG PO TABS: 800.0000 mg | ORAL_TABLET | Freq: Three times a day (TID) | ORAL | Status: AC

## 2011-11-09 MED ORDER — KETOROLAC TROMETHAMINE 60 MG/2ML IM SOLN
60.0000 mg | Freq: Once | INTRAMUSCULAR | Status: AC
  Administered 2011-11-09: 60 mg via INTRAMUSCULAR
  Filled 2011-11-09: qty 2

## 2011-11-09 MED ORDER — BENZONATATE 100 MG PO CAPS
200.0000 mg | ORAL_CAPSULE | Freq: Once | ORAL | Status: AC
  Administered 2011-11-09: 200 mg via ORAL
  Filled 2011-11-09: qty 2

## 2011-11-09 MED ORDER — BENZONATATE 200 MG PO CAPS: 200.0000 mg | ORAL_CAPSULE | Freq: Three times a day (TID) | ORAL | Status: AC | PRN

## 2011-11-09 MED ORDER — ONDANSETRON 8 MG PO TBDP: 8.0000 mg | ORAL_TABLET | Freq: Three times a day (TID) | ORAL | Status: AC | PRN

## 2011-11-09 NOTE — ED Provider Notes (Signed)
History     CSN: [id number] Arrival date & time: 11/09/2011 12:05 PM   First MD Initiated Contact with Patient 11/09/11 1412      Chief Complaint  Patient presents with  . Generalized Body Aches  . Nausea    (Consider location/radiation/quality/duration/timing/severity/associated sxs/prior treatment) HPI Patient is a 40 year old female with no history of medical problems who presents today complaining of cough, congestion, fevers, and myalgias for the past 4 days. 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 Patient also endorses a few episodes of nausea and vomiting which she thinks are related to the drainage from her head. She did not describe particularly severe cough. She endorses diffuse muscle aches that are a 6/10. She's been taking over-the-counter medications without relief. Patient was concerned as her symptoms are he lasted 4 days. She denies any other GI symptoms or any urinary symptoms. There are no other associated or modifying factors. History reviewed. No pertinent past medical history.  Past Surgical History  Procedure Date  . Tubal ligation     History reviewed. No pertinent family history.  History  Substance Use Topics  . Smoking status: Never Smoker   . Smokeless tobacco: Not on file  . Alcohol Use: No     3x/wk    OB History    Grav Para Term Preterm Abortions TAB SAB Ect Mult Living                  Review of Systems  Constitutional: Positive for fever and fatigue.  HENT: Positive for congestion and postnasal drip.   Eyes: Negative.   Respiratory: Positive for cough.   Cardiovascular: Negative.   Gastrointestinal: Positive for nausea and vomiting.  Genitourinary: Negative.   Musculoskeletal: Positive for myalgias.  Skin: Negative.   Neurological: Positive for headaches.  Hematological: Negative.   Psychiatric/Behavioral: Negative.   All other systems reviewed and are negative.    Allergies  Acetaminophen  Home  Medications   Current Outpatient Rx  Name Route Sig Dispense Refill  . RISAQUAD PO CAPS Oral Take 2 capsules by mouth daily.      Marland Kitchen BENZONATATE 200 MG PO CAPS Oral Take 1 capsule (200 mg total) by mouth 3 (three) times daily as needed for cough. 20 capsule 0  . IBUPROFEN 800 MG PO TABS Oral Take 1 tablet (800 mg total) by mouth 3 (three) times daily. 21 tablet 0  . ONDANSETRON 8 MG PO TBDP Oral Take 1 tablet (8 mg total) by mouth every 8 (eight) hours as needed for nausea. 20 tablet 0    BP 134/90  Pulse 84  Temp(Src) 98.7 F (37.1 C) (Oral)  Resp 18  SpO2 100%  LMP 10/29/2011  Physical Exam  Nursing note and vitals reviewed. Constitutional: She is oriented to person, place, and time. She appears well-developed and well-nourished. No distress.  HENT:  Head: Normocephalic and atraumatic.  Nose: Mucosal edema present.  Mouth/Throat: Uvula is midline and mucous membranes are normal. Posterior oropharyngeal edema and posterior oropharyngeal erythema present. No oropharyngeal exudate.  Eyes: Conjunctivae and EOM are normal. Pupils are equal, round, and reactive to light.  Neck: Normal range of motion.  Cardiovascular: Normal rate, regular rhythm, normal heart sounds and intact distal pulses.  Exam reveals no gallop and no friction rub.   No murmur heard. Pulmonary/Chest: Effort normal and breath sounds normal. No respiratory distress. She has no wheezes. She has no rales.  Abdominal: Soft. Bowel sounds are normal. She exhibits no  distension. There is no tenderness. There is no rebound and no guarding.  Musculoskeletal: Normal range of motion. She exhibits no edema and no tenderness.  Neurological: She is alert and oriented to person, place, and time. No cranial nerve deficit. She exhibits normal muscle tone. Coordination normal.  Skin: Skin is warm and dry. No rash noted.  Psychiatric: She has a normal mood and affect.    ED Course  Procedures (including critical care time)  Labs  Reviewed - No data to display No results found.   1. Viral syndrome   2. Upper respiratory infection       MDM  Patient was very benign in appearance and had presentation consistent with viral upper respiratory syndrome. She did not require chest x-ray as she had minimal and reportedly improving cough with no fevers or abnormal vital signs. She has no history of respiratory illness. She was treated for her symptoms with Toradol, Tessalon Perles, and oral Zofran. She was discharged with prescriptions for ibuprofen, Zofran, Tessalon Perles. Patient was provided with a work note. She was comfortable with plan for discharge home and was discharged in good condition.        Cyndra Numbers, MD 11/09/11 [phone]

## 2011-11-09 NOTE — ED Notes (Signed)
Pt reports flu like symptoms since Saturday. Vomiting overnight Saturday. C/o HA, generalized body aches. Using OTC theraflu and cold/congestion meds with some relief.

## 2011-12-07 ENCOUNTER — Encounter (HOSPITAL_COMMUNITY): Payer: Self-pay | Admitting: *Deleted

## 2011-12-07 ENCOUNTER — Emergency Department (INDEPENDENT_AMBULATORY_CARE_PROVIDER_SITE_OTHER)
Admission: EM | Admit: 2011-12-07 | Discharge: 2011-12-07 | Disposition: A | Discharge status: Home / Self Care | Payer: Self-pay | Source: Home / Self Care | Attending: Family Medicine | Admitting: Family Medicine

## 2011-12-07 DIAGNOSIS — B9689 Other specified bacterial agents as the cause of diseases classified elsewhere: Secondary | ICD-10-CM

## 2011-12-07 DIAGNOSIS — A499 Bacterial infection, unspecified: Secondary | ICD-10-CM

## 2011-12-07 DIAGNOSIS — N760 Acute vaginitis: Secondary | ICD-10-CM

## 2011-12-07 HISTORY — DX: Other specified bacterial agents as the cause of diseases classified elsewhere: N76.0

## 2011-12-07 HISTORY — DX: Other specified bacterial agents as the cause of diseases classified elsewhere: B96.89

## 2011-12-07 LAB — WET PREP, GENITAL: Yeast Wet Prep HPF POC: NONE SEEN

## 2011-12-07 MED ORDER — METRONIDAZOLE 250 MG PO TABS: 250.0000 mg | ORAL_TABLET | Freq: Three times a day (TID) | ORAL | Status: AC

## 2011-12-07 NOTE — ED Notes (Signed)
Vaginal discharge x 5 days used terazol suppository without relief

## 2011-12-07 NOTE — ED Provider Notes (Signed)
History     CSN: [id number]  Arrival date & time 12/07/11  2725   First MD Initiated Contact with Patient 12/07/11 1925      Chief Complaint  Patient presents with  . Vaginal Discharge    (Consider location/radiation/quality/duration/timing/severity/associated sxs/prior treatment) Patient is a 41 y.o. female presenting with vaginal discharge. The history is provided by the patient.  Vaginal Discharge This is a new problem. The current episode started more than 2 days ago. The problem occurs constantly. The problem has not changed since onset.Associated symptoms comments: Mild itching, no odor, . Treatments tried: terazol 3 supp. The treatment provided no relief.    Past Medical History  Diagnosis Date  . Bacterial vaginosis     Past Surgical History  Procedure Date  . Tubal ligation   . Tubal ligation     History reviewed. No pertinent family history.  History  Substance Use Topics  . Smoking status: Never Smoker   . Smokeless tobacco: Not on file  . Alcohol Use: Yes     3x/wk    OB History    Grav Para Term Preterm Abortions TAB SAB Ect Mult Living                  Review of Systems  Constitutional: Negative.   Gastrointestinal: Negative.   Genitourinary: Positive for vaginal discharge. Negative for dysuria, frequency, vaginal bleeding and vaginal pain.    Allergies  Acetaminophen and Vicodin  Home Medications   Current Outpatient Rx  Name Route Sig Dispense Refill  . RISAQUAD PO CAPS Oral Take 2 capsules by mouth daily.      Marland Kitchen METRONIDAZOLE 250 MG PO TABS Oral Take 1 tablet (250 mg total) by mouth 3 (three) times daily. 21 tablet 0    BP 166/95  Pulse 64  Temp(Src) 98.5 F (36.9 C) (Oral)  Resp 16  SpO2 100%  LMP 11/27/2011  Physical Exam  Nursing note and vitals reviewed. Constitutional: She appears well-developed and well-nourished.  Abdominal: Soft. Bowel sounds are normal. She exhibits no distension. There is no tenderness. There is no  rebound and no guarding.  Genitourinary: Uterus normal. Cervix exhibits no motion tenderness, no discharge and no friability. There is erythema around the vagina. No tenderness or bleeding around the vagina. No signs of injury around the vagina. Vaginal discharge found.    ED Course  Procedures (including critical care time)   Labs Reviewed  POCT PREGNANCY, URINE  GC/CHLAMYDIA PROBE AMP, GENITAL  WET PREP, GENITAL  POCT URINALYSIS DIPSTICK  POCT PREGNANCY, URINE   No results found.   1. Bacterial vaginitis       MDM          Barkley Bruns, MD 12/07/11 2105

## 2011-12-08 ENCOUNTER — Telehealth (HOSPITAL_COMMUNITY): Payer: Self-pay | Admitting: *Deleted

## 2011-12-08 LAB — GC/CHLAMYDIA PROBE AMP, GENITAL: GC Probe Amp, Genital: NEGATIVE

## 2011-12-08 NOTE — ED Notes (Signed)
GC/Chlamydia neg., Wet prep: Few clue cells, many trich., WBC's TNTC. Pt. adequately treated with Flagyl. I called and left message to call.

## 2011-12-23 ENCOUNTER — Encounter (HOSPITAL_COMMUNITY): Payer: Self-pay | Admitting: Emergency Medicine

## 2011-12-23 ENCOUNTER — Emergency Department (INDEPENDENT_AMBULATORY_CARE_PROVIDER_SITE_OTHER)
Admission: EM | Admit: 2011-12-23 | Discharge: 2011-12-23 | Disposition: A | Discharge status: Home / Self Care | Payer: Self-pay | Source: Home / Self Care | Attending: Emergency Medicine | Admitting: Emergency Medicine

## 2011-12-23 DIAGNOSIS — I10 Essential (primary) hypertension: Secondary | ICD-10-CM

## 2011-12-23 DIAGNOSIS — H9209 Otalgia, unspecified ear: Secondary | ICD-10-CM

## 2011-12-23 DIAGNOSIS — H6120 Impacted cerumen, unspecified ear: Secondary | ICD-10-CM

## 2011-12-23 HISTORY — DX: Essential (primary) hypertension: I10

## 2011-12-23 MED ORDER — HYDROCHLOROTHIAZIDE 25 MG PO TABS: 25.0000 mg | ORAL_TABLET | Freq: Every day | ORAL | Status: DC

## 2011-12-23 NOTE — ED Notes (Signed)
HERE WITH  BILAT EAR FULLNESS AND PAIN IN THE LEFT SINCE Monday.DIZZINESS ALSO REPORTED BUT BP 160/104.PT HAS HX HTN BUT NOT TAKING MEDICINE

## 2011-12-23 NOTE — ED Provider Notes (Signed)
History     CSN: [id number]  Arrival date & time 12/23/11  0808   First MD Initiated Contact with Patient 12/23/11 [phone]      Chief Complaint  Patient presents with  . Ear Fullness    (Consider location/radiation/quality/duration/timing/severity/associated sxs/prior treatment) HPI Comments: Left sided ear feels "fullness and soe sometimes feel a bit dizzy if i move quickly or oddly  Patient is a 41 y.o. female presenting with plugged ear sensation. The history is provided by the patient.  Ear Fullness This is a recurrent problem. The problem occurs constantly. The problem has been rapidly worsening. Pertinent negatives include no chest pain and no headaches. The symptoms are aggravated by nothing. The symptoms are relieved by nothing.    Past Medical History  Diagnosis Date  . Bacterial vaginosis   . Hypertension     Past Surgical History  Procedure Date  . Tubal ligation   . Tubal ligation     No family history on file.  History  Substance Use Topics  . Smoking status: Never Smoker   . Smokeless tobacco: Not on file  . Alcohol Use: Yes     3x/wk    OB History    Grav Para Term Preterm Abortions TAB SAB Ect Mult Living                  Review of Systems  Constitutional: Negative for fever and fatigue.  HENT: Negative for congestion, neck pain, neck stiffness and postnasal drip.   Cardiovascular: Negative for chest pain.  Neurological: Negative for headaches.    Allergies  Acetaminophen and Vicodin  Home Medications   Current Outpatient Rx  Name Route Sig Dispense Refill  . RISAQUAD PO CAPS Oral Take 2 capsules by mouth daily.      Marland Kitchen HYDROCHLOROTHIAZIDE 25 MG PO TABS Oral Take 1 tablet (25 mg total) by mouth daily. 30 tablet 0    BP 160/104  Pulse 64  Temp(Src) 97.8 F (36.6 C) (Oral)  Resp 16  SpO2 100%  LMP 11/27/2011  Physical Exam  Constitutional: She appears well-developed and well-nourished. No distress.  HENT:  Left Ear: Hearing and  external ear normal.  Ears:  Mouth/Throat: Uvula is midline, oropharynx is clear and moist and mucous membranes are normal.  Skin: Skin is warm.    ED Course  Procedures (including critical care time)  Labs Reviewed - No data to display No results found.   1. Cerumen impaction   2. Hypertension   3. Otalgia       MDM  Cerumen impaction presenting with L sided cerumen impaction        Jimmie Molly, MD 12/23/11 1709

## 2012-02-25 ENCOUNTER — Emergency Department (HOSPITAL_COMMUNITY)
Admission: EM | Admit: 2012-02-25 | Discharge: 2012-02-25 | Disposition: A | Discharge status: Home / Self Care | Payer: Self-pay | Source: Home / Self Care | Attending: Family Medicine | Admitting: Family Medicine

## 2012-02-25 ENCOUNTER — Encounter (HOSPITAL_COMMUNITY): Payer: Self-pay | Admitting: *Deleted

## 2012-02-25 DIAGNOSIS — N760 Acute vaginitis: Secondary | ICD-10-CM

## 2012-02-25 LAB — POCT URINALYSIS DIP (DEVICE)
Bilirubin Urine: NEGATIVE
Leukocytes, UA: NEGATIVE
Nitrite: NEGATIVE
Urobilinogen, UA: 2 mg/dL — ABNORMAL HIGH (ref 0.0–1.0)
pH: 6.5 (ref 5.0–8.0)

## 2012-02-25 LAB — WET PREP, GENITAL

## 2012-02-25 MED ORDER — FLUCONAZOLE 150 MG PO TABS: 150.0000 mg | ORAL_TABLET | Freq: Once | ORAL | Status: AC

## 2012-02-25 MED ORDER — METRONIDAZOLE 500 MG PO TABS: 500.0000 mg | ORAL_TABLET | Freq: Two times a day (BID) | ORAL | Status: DC

## 2012-02-25 NOTE — Discharge Instructions (Signed)
Your examination did not reveal any vaginal discharge, or any other abnormality. Some labs are still pending, including those for bacterial vaginosis and yeast. Should these reveal evidence of infection, we will call you and instruct you to begin the antibiotics. I am writing a rx for metronidazole, but you do NOT need to take this medication unless instructed to do so. I am also writing a rx for Diflucan, a yeast medication. You do not need to take this medication unless you experience symptoms of a yeast infection, such as redness, itching, thick white discharge, etc. If you have any concerns, return to care for re-evaluation.

## 2012-02-25 NOTE — ED Notes (Signed)
C/O foul odor from vaginal area approx 3 days prior to onset of LMP.  Denies any pain.

## 2012-02-25 NOTE — ED Provider Notes (Signed)
History     CSN: [id number]  Arrival date & time 02/25/12  0903   First MD Initiated Contact with Patient 02/25/12 [phone]      Chief Complaint  Patient presents with  . Vaginal Discharge    (Consider location/radiation/quality/duration/timing/severity/associated sxs/prior treatment) HPI Comments: Katelyn Hardin presents for evaluation of an "odor" in the vaginal area over the last few days. She reports that she recently completed her menstrual period, so she is unsure of any discharge. However she does report an unusual odor not consistent with a normal vaginal odor. She denies any sexual partners; she is married but does not use condoms. She denies any urinary symptoms.  Patient is a 41 y.o. female presenting with vaginal discharge. The history is provided by the patient.  Vaginal Discharge This is a new problem. The current episode started more than 2 days ago. The problem occurs constantly. The problem has not changed since onset.Pertinent negatives include no chest pain, no abdominal pain, no headaches and no shortness of breath. The symptoms are aggravated by nothing. The symptoms are relieved by nothing. She has tried nothing for the symptoms.    Past Medical History  Diagnosis Date  . Bacterial vaginosis   . Hypertension     Past Surgical History  Procedure Date  . Tubal ligation   . Tubal ligation     History reviewed. No pertinent family history.  History  Substance Use Topics  . Smoking status: Never Smoker   . Smokeless tobacco: Not on file  . Alcohol Use: Yes     3x/wk    OB History    Grav Para Term Preterm Abortions TAB SAB Ect Mult Living                  Review of Systems  Constitutional: Negative.   HENT: Negative.   Eyes: Negative.   Respiratory: Negative.  Negative for shortness of breath.   Cardiovascular: Negative.  Negative for chest pain.  Gastrointestinal: Negative.  Negative for abdominal pain.  Genitourinary: Negative.  Negative for dysuria,  vaginal bleeding, vaginal discharge, vaginal pain and menstrual problem.  Musculoskeletal: Negative.   Skin: Negative.   Neurological: Negative.  Negative for headaches.    Allergies  Acetaminophen; Codeine; Tylox; and Vicodin  Home Medications   Current Outpatient Rx  Name Route Sig Dispense Refill  . HYDROCHLOROTHIAZIDE 25 MG PO TABS Oral Take 1 tablet (25 mg total) by mouth daily. 30 tablet 0  . RISAQUAD PO CAPS Oral Take 2 capsules by mouth daily.      Marland Kitchen FLUCONAZOLE 150 MG PO TABS Oral Take 1 tablet (150 mg total) by mouth once. Take one pill once. May repeat if symptoms persist after 3rd day. 2 tablet 0  . METRONIDAZOLE 500 MG PO TABS Oral Take 1 tablet (500 mg total) by mouth 2 (two) times daily. 14 tablet 0    BP 128/86  Pulse 71  Temp(Src) 97.1 F (36.2 C) (Oral)  Resp 16  SpO2 100%  LMP 02/18/2012  Physical Exam  Nursing note and vitals reviewed. Constitutional: She is oriented to person, place, and time. She appears well-developed and well-nourished.  HENT:  Head: Normocephalic and atraumatic.  Eyes: EOM are normal.  Neck: Normal range of motion.  Pulmonary/Chest: Effort normal.  Genitourinary: Vagina normal. Cervix exhibits no discharge. No tenderness or bleeding around the vagina. No vaginal discharge found.  Musculoskeletal: Normal range of motion.  Neurological: She is alert and oriented to person, place, and time.  Skin:  Skin is warm and dry.  Psychiatric: Her behavior is normal.    ED Course  Procedures (including critical care time)  Labs Reviewed  POCT URINALYSIS DIP (DEVICE) - Abnormal; Notable for the following:    Ketones, ur TRACE (*)    Urobilinogen, UA 2.0 (*)    All other components within normal limits  POCT PREGNANCY, URINE  GC/CHLAMYDIA PROBE AMP, GENITAL  WET PREP, GENITAL   No results found.   1. Vaginitis       MDM  Exam unremarkable; given a rx for metronidazole and fluconazole, but instructed NOT to take this unless  instructed to do so; UA and Upreg reviewed; GC/chlamydia and wet prep pending        Renaee Munda, MD 02/25/12 [phone]

## 2012-02-27 LAB — GC/CHLAMYDIA PROBE AMP, GENITAL: Chlamydia, DNA Probe: NEGATIVE

## 2012-02-28 ENCOUNTER — Telehealth (HOSPITAL_COMMUNITY): Payer: Self-pay | Admitting: *Deleted

## 2012-02-28 MED ORDER — FLUCONAZOLE 150 MG PO TABS: 150.0000 mg | ORAL_TABLET | Freq: Once | ORAL | Status: AC

## 2012-02-28 MED ORDER — METRONIDAZOLE 500 MG PO TABS: 500.0000 mg | ORAL_TABLET | Freq: Two times a day (BID) | ORAL | Status: AC

## 2012-02-28 MED ORDER — HYDROCHLOROTHIAZIDE 25 MG PO TABS: 25.0000 mg | ORAL_TABLET | Freq: Every day | ORAL | Status: DC

## 2012-02-28 NOTE — ED Notes (Signed)
Pt. called on VM 4/8 and said she lost the Rx. she was supposed to use, if I called her back. 4/9 Pt. called back and verified x 2.  Pt. given results. Pt.sSaid she accidentally threw the Rx. In the garbage can and it is all wet and unusable.  I told she did not need the Rx., because she only had a few clue cells and a few WBC's on her wet prep. The doctors only treat if mod. or many clue cells.  She said she still has odor and discharge.  I told her I would have to ask Dr. Juanetta Gosling and call her back. If he approves order, she wants Rx. sent to [facility] on [address] Discussed with Dr. Juanetta Gosling @ 1230.  He e-prescribed a Rx. to [facility] on [address] @ 1730. I called pt. and left message the Rx. has been sent there.  Pt. told to call back if any questions. Vassie Moselle 02/28/2012

## 2012-03-11 ENCOUNTER — Telehealth (HOSPITAL_COMMUNITY): Payer: Self-pay | Admitting: Family Medicine

## 2012-03-11 NOTE — ED Notes (Signed)
Patient called back and was made aware that we would not call anything in for her given the length of time it has been since she was seen by Dr Juanetta Gosling.  Patient was unhappy.  She stated she was out of town and was unable to pick up the medicine.

## 2012-03-11 NOTE — ED Notes (Signed)
Pharmacy called stating that patient was missing a prescription.  Chart was reviewed by Dr Ladon Applebaum.  Patient's labs from 02/25/12 show few Clue cells and few WBC which does not require treatment as earlier explained to patient by Cherly Anderson RN on 02/28/12.  Dr Juanetta Gosling E-scribed patient an RX for Diflucan because she was still symptomatic.  Pharmacy called today stating patient thought she should also have a RX for Flagyl.  According to the chart Dr Juanetta Gosling only e-scribed the one RX.  The note from 02/28/12 does not state which RX patient threw away.   After Dr Ladon Applebaum reviewed chart he stated patient would need to be re-evaluated for any additional RX since it has been 15 days since she was seen.  Pharmacy was made aware that patient would need to be re-evaluated in order to see any other treatment.  I attempted to contact patient at [phone] to notify her but she was unable to be reached and a message was left for her to call us.

## 2012-03-12 ENCOUNTER — Other Ambulatory Visit (HOSPITAL_COMMUNITY): Payer: Self-pay | Admitting: Internal Medicine

## 2012-03-12 DIAGNOSIS — Z1231 Encounter for screening mammogram for malignant neoplasm of breast: Secondary | ICD-10-CM

## 2012-03-13 ENCOUNTER — Telehealth (HOSPITAL_COMMUNITY): Payer: Self-pay | Admitting: *Deleted

## 2012-03-13 NOTE — ED Notes (Signed)
Pt called in regards to request for Flagyl Rx from 02/25/12 visit.  Pt instructed lab results did not warrant treatment with Flagyl and that Diflucan Rx had been called to pharmacy by Dr. Juanetta Gosling.  Pt states she does not want the Diflucan and that she wanted to take the Flagyl "by choice."  Pt instructed to come back in for reevaluation for any further symptoms as previously instructed by Dr. Ladon Applebaum.

## 2012-04-04 ENCOUNTER — Ambulatory Visit (HOSPITAL_COMMUNITY)
Admission: RE | Admit: 2012-04-04 | Discharge: 2012-04-04 | Disposition: A | Discharge status: Home / Self Care | Payer: Self-pay | Source: Ambulatory Visit | Attending: Internal Medicine | Admitting: Internal Medicine

## 2012-04-04 DIAGNOSIS — Z1231 Encounter for screening mammogram for malignant neoplasm of breast: Secondary | ICD-10-CM | POA: Insufficient documentation

## 2012-06-26 ENCOUNTER — Encounter (HOSPITAL_COMMUNITY): Payer: Self-pay | Admitting: Emergency Medicine

## 2012-06-26 ENCOUNTER — Emergency Department (INDEPENDENT_AMBULATORY_CARE_PROVIDER_SITE_OTHER)
Admission: EM | Admit: 2012-06-26 | Discharge: 2012-06-26 | Disposition: A | Discharge status: Home / Self Care | Payer: Self-pay | Source: Home / Self Care | Attending: Emergency Medicine | Admitting: Emergency Medicine

## 2012-06-26 DIAGNOSIS — I10 Essential (primary) hypertension: Secondary | ICD-10-CM

## 2012-06-26 DIAGNOSIS — N760 Acute vaginitis: Secondary | ICD-10-CM

## 2012-06-26 DIAGNOSIS — R350 Frequency of micturition: Secondary | ICD-10-CM

## 2012-06-26 DIAGNOSIS — N83209 Unspecified ovarian cyst, unspecified side: Secondary | ICD-10-CM

## 2012-06-26 DIAGNOSIS — M25559 Pain in unspecified hip: Secondary | ICD-10-CM

## 2012-06-26 DIAGNOSIS — M549 Dorsalgia, unspecified: Secondary | ICD-10-CM

## 2012-06-26 LAB — POCT URINALYSIS DIP (DEVICE)
Glucose, UA: NEGATIVE mg/dL
Ketones, ur: NEGATIVE mg/dL
Leukocytes, UA: NEGATIVE
pH: 7 (ref 5.0–8.0)

## 2012-06-26 LAB — WET PREP, GENITAL
Clue Cells Wet Prep HPF POC: NONE SEEN
Trich, Wet Prep: NONE SEEN
Yeast Wet Prep HPF POC: NONE SEEN

## 2012-06-26 MED ORDER — HYDROCHLOROTHIAZIDE 25 MG PO TABS: 25.0000 mg | ORAL_TABLET | Freq: Every day | ORAL | Status: DC

## 2012-06-26 MED ORDER — MELOXICAM 15 MG PO TABS: 15.0000 mg | ORAL_TABLET | Freq: Every day | ORAL | Status: DC

## 2012-06-26 MED ORDER — CULTURELLE PO CAPS: 1.0000 | ORAL_CAPSULE | Freq: Every day | ORAL | Status: DC

## 2012-06-26 MED ORDER — METRONIDAZOLE 500 MG PO TABS: 500.0000 mg | ORAL_TABLET | Freq: Two times a day (BID) | ORAL | Status: AC

## 2012-06-26 NOTE — ED Provider Notes (Signed)
Chief Complaint  Patient presents with  . Urinary Frequency  . Vaginitis    History of Present Illness:   The patient is a 41 year old female with a three-week history of urinary frequency, cloudy urine, and odor to the urine. She denies any dysuria or blood in the urine. She has had urinary tract infections in the past, last one being about 2 years ago. She also notes lower back pain and crampy, diffuse abdominal pain. This occurs daily and she has a history of bowel syndrome. The past 6 months she's had pain in her left leg and thigh. This tends to hurt at nighttime. It radiates down her leg. Yesterday she noted some vaginal discharge and odor. Her menses have been irregular and she had 2 menses this month. Her menses tend to be irregular. She skipped her menses altogether in February that had 2 menses in March. She is sexually active and has had bilateral tubal ligation uses condoms. She denies any fever, chills, nausea, or vomiting.  She has high blood pressure and is on hydrochlorothiazide, but could not get this filled since she goes to a [facility].  Review of Systems:  Other than noted above, the patient denies any of the following symptoms: Systemic:  No fever, chills, sweats, fatigue, or weight loss. GI:  No abdominal pain, nausea, anorexia, vomiting, diarrhea, constipation, melena or hematochezia. GU:  No dysuria, frequency, urgency, hematuria, vaginal discharge, itching, or abnormal vaginal bleeding. Skin:  No rash or itching.   PMFSH:  Past medical history, family history, social history, meds, and allergies were reviewed.  Physical Exam:   Vital signs:  BP 142/96  Pulse 60  Temp 98.1 F (36.7 C) (Oral)  Resp 18  SpO2 100%  LMP 06/18/2012 General:  Alert, oriented and in no distress. Lungs:  Breath sounds clear and equal bilaterally.  No wheezes, rales or rhonchi. Heart:  Regular rhythm.  No gallops or murmers. Abdomen:  Soft, flat and non-distended.  No  organomegaly or mass.  No tenderness, guarding or rebound.  Bowel sounds normally active. Pelvic exam:  Normal external genitalia, vaginal and cervical mucosa were unremarkable, uterus was midposition, slightly enlarged and there was a mass off to the left of the uterus which felt somewhat firm, either fibroid tumor or ovarian cyst. There are no masses in the right and no tenderness. Skin:  Clear, warm and dry.  Labs:   Results for orders placed during the hospital encounter of 06/26/12  POCT URINALYSIS DIP (DEVICE)      Component Value Range   Glucose, UA NEGATIVE  NEGATIVE mg/dL   Bilirubin Urine SMALL (*) NEGATIVE   Ketones, ur NEGATIVE  NEGATIVE mg/dL   Specific Gravity, Urine 1.025  1.005 - 1.030   Hgb urine dipstick NEGATIVE  NEGATIVE   pH 7.0  5.0 - 8.0   Protein, ur NEGATIVE  NEGATIVE mg/dL   Urobilinogen, UA 2.0 (*) 0.0 - 1.0 mg/dL   Nitrite NEGATIVE  NEGATIVE   Leukocytes, UA NEGATIVE  NEGATIVE  POCT PREGNANCY, URINE      Component Value Range   Preg Test, Ur NEGATIVE  NEGATIVE  WET PREP, GENITAL      Component Value Range   Yeast Wet Prep HPF POC NONE SEEN  NONE SEEN   Trich, Wet Prep NONE SEEN  NONE SEEN   Clue Cells Wet Prep HPF POC NONE SEEN  NONE SEEN   WBC, Wet Prep HPF POC MANY (*) NONE SEEN     Assessment:  The primary encounter diagnosis was Ovarian cyst. Diagnoses of Back pain, Hip pain, Vaginitis, Urinary frequency, and HYPERTENSION, BENIGN ESSENTIAL were also pertinent to this visit. She has a left adnexal mass which may be an ovarian cyst or a fibroid tumor. This will need further evaluation and followup by a gynecologist and I recommended Dr. Lenice Llamas. He is not appear to have a urinary tract infection. I did obtain a culture. She has had recurring episodes of atrial vaginosis and I prescribed lactobacillus for that. I also prescribed a refill for her hydrochlorothiazide since she goes to [facility]. She was given meloxicam for the pain in the hip  and back.  Plan:   1.  The following meds were prescribed:   New Prescriptions   HYDROCHLOROTHIAZIDE (HYDRODIURIL) 25 MG TABLET    Take 1 tablet (25 mg total) by mouth daily.   LACTOBACILLUS RHAMNOSUS, GG, (CULTURELLE) CAPS    Take 1 capsule by mouth daily.   MELOXICAM (MOBIC) 15 MG TABLET    Take 1 tablet (15 mg total) by mouth daily.   METRONIDAZOLE (FLAGYL) 500 MG TABLET    Take 1 tablet (500 mg total) by mouth 2 (two) times daily.   2.  The patient was instructed in symptomatic care and handouts were given. 3.  The patient was told to return if becoming worse in any way, if no better in 3 or 4 days, and given some red flag symptoms that would indicate earlier return.    Reuben Likes, MD 06/26/12 [phone]

## 2012-06-26 NOTE — ED Notes (Signed)
C/o urinary frequency, cloudy urine with odor for 3 weeks.  Also thinks she has a yeast infection.  Reports irregular periods with 2 periods this month.  Additionally ongoing problems with lt thigh, lt leg and lower back pain.

## 2012-06-27 LAB — URINE CULTURE: Colony Count: NO GROWTH

## 2012-06-27 LAB — GC/CHLAMYDIA PROBE AMP, GENITAL
Chlamydia, DNA Probe: NEGATIVE
GC Probe Amp, Genital: NEGATIVE

## 2012-07-19 ENCOUNTER — Encounter: Payer: Self-pay | Admitting: Obstetrics & Gynecology

## 2012-08-17 ENCOUNTER — Encounter: Payer: Self-pay | Admitting: Obstetrics & Gynecology

## 2012-08-17 ENCOUNTER — Ambulatory Visit (INDEPENDENT_AMBULATORY_CARE_PROVIDER_SITE_OTHER): Payer: Self-pay | Admitting: Obstetrics & Gynecology

## 2012-08-17 VITALS — BP 128/95 | HR 73 | Temp 97.7°F | Ht 66.0 in | Wt 182.0 lb

## 2012-08-17 DIAGNOSIS — N946 Dysmenorrhea, unspecified: Secondary | ICD-10-CM

## 2012-08-17 DIAGNOSIS — N920 Excessive and frequent menstruation with regular cycle: Secondary | ICD-10-CM | POA: Insufficient documentation

## 2012-08-17 NOTE — Patient Instructions (Signed)
Pelvic Pain in Women, Generic  Pelvic pain may be constant or come and go. It may be mild or severe. It is important to tell your caregiver exactly where the pain is located, when and how it occurs, and if it is related to your menstrual periods or stress. We have not found a definite cause for your pelvic pain today and you may need follow-up testing and examination.  CAUSES    Sexually transmitted diseases (STDS) cause pelvic inflammatory disease (PID). This is one of the most common causes of pelvic pain. It is an infection of the female sexual organs.   Endometriosis - This is a condition where some of the inside lining of the uterus is growing in the pelvis and abdomen outside the uterus. Along with (chronic) pain, this can cause infertility.   Tubal pregnancy - This is a serious condition where the pregnancy has occurred in a fallopian tube. Rupture of the tube can bleed heavily and cause death if it is not found in time.   Interstitial cystitis is an inflammation of the bladder that causes pelvic pain. People with severe cases of IC may urinate as many as 60 times a day.   Fibroids: A small percentage of women have uterine fibroids (non-cancerous smooth muscle growths in the uterus). Fibroids do not always cause pain.   Fibromyalgia is a disorder with symptoms of widespread muscle pain, fatigue and multiple tender points on the body.   Dysmenorrhea is painful menstrual periods.   Mittlesmertz is pain with ovulation.   Pelvic congestive syndrome, is engorgement of the pelvic veins just before and during a menstrual period.   Cervical stenosis is when the opening of the cervix is too small and causes pain during menstruation.   Adenomyosis (a type of endometriosis) glands that line the inside of the uterus lying in the muscle layer of the uterus.   Intestinal problems such as irritable bowel syndrome colitis or ileitis.   Appendicitis.   Pelvic cancer. Usually the cancer has been there for awhile  before causing pain.   Bladder infection.   Cysts or ovarian tumors.   Kidney stone.   Psychological factors (stress, sexual abuse or depression).   IUD (intrauterine device).   Prolapse (falling down of the uterus).   Retroflexed uterus - the uterus is tipped too far backwards.   Muscle spasms of the pelvic muscles.   Muscular-skeletal problems of the back (herniated disc).  DIAGNOSIS    Your caregiver may order testing, such as:   Blood tests.   Cultures to test for infection.   Ultrasound.   Looking into the bladder with a metal tube with a light (cystoscopy).   Looking into the pelvis and abdomen with very small incisions through a metal tube with a light (laparoscopy).   Looking into the large intestine with a fibro-optic tube with a light (colonoscopy).   CT scan - a type of X-ray to view the internal organs of the pelvis and abdomen.   MRI - views the pelvic and abdominal organs with a magnetic machine.   Intravenous pyelogram - views the kidneys, ureter and bladder after injecting dye through the vein by X-rays.   Injecting barium into the large intestine to view the intestine with X-rays (barium enema).   Not all test results are available during your visit. If your test results are not back during the visit, make an appointment with your caregiver or the medical facility. It is important for you to follow up   on all of your test results.  TREATMENT   Treatment will depend on the cause of the pain, such as:   Medication, antibiotics, pain medication, muscle relaxants, anti-depression drugs, hormones or birth control pills.   Physical therapy.   Acupuncture.   Psychiatric counseling.   Nerve blocks.   Surgery.  HOME CARE INSTRUCTIONS    Finish all medication as prescribed. Incomplete treatment will put you at risk for sterility and tubal pregnancy if your caregiver feels your pain is caused by an infection.   Rest and eat a balanced diet with plenty of fluids.   If you do have an  infection, your recent sexual partners may need treatment even if they are symptom-free or have a negative culture or evaluation. You also need follow-up to make sure you are no longer infected.   Only take over-the-counter or prescription medicines for pain, discomfort or fever as directed by your caregiver.   Apply warm or cold compresses to the lower abdomen depending on which one helps the pain.   Avoid stressful situations that may cause the pain.   Group therapy is sometimes helpful.   Make sure to follow all instructions. Some of the conditions listed above can have very serious outcomes if you do not take the time to follow-up with your caregiver.  SEEK IMMEDIATE MEDICAL CARE IF:    There is heavier bleeding from the birth canal (vagina).   You develop increasing abdominal pain.   You feel lightheaded or pass out.   An unexplained oral temperature above 102 F (38.9 C) develops.   Any of the problems which brought you to us are getting worse.   You are being physically or sexually abused.   You have painful urination.   You are still having pain four hours after taking prescription pain medication.   You have uncontrolled diarrhea.   You have abnormal vaginal discharge.  Document Released: 10/04/2004 Document Revised: 10/27/2011 Document Reviewed: 11/04/2008  Exit[employer] Patient Information 2012 [employer].

## 2012-08-17 NOTE — Progress Notes (Signed)
Patient ID: Katelyn Hardin, female   DOB: 05/26/1971, 41 y.o.   MRN: [id number]  Chief Complaint  Patient presents with  . Fibroids    HPI Katelyn Hardin is a 41 y.o. female.  [id number] Patient's last menstrual period was 08/05/2012. Referred by [facility]. She presented in August complaining of lower back pain and left leg pain. She also has some pelvic pain which is fairly constant for last few months. She has a history of irritable bowel syndrome. Her periods have been coming more irregular and have been more heavy and painful since the end of last year. HPI  Past Medical History  Diagnosis Date  . Bacterial vaginosis   . Hypertension   . Anemia     Past Surgical History  Procedure Date  . Tubal ligation   . Tubal ligation     Family History  Problem Relation Age of Onset  . Thyroid disease Sister   . Cancer Maternal Aunt     breast    Social History History  Substance Use Topics  . Smoking status: Never Smoker   . Smokeless tobacco: Never Used  . Alcohol Use: 2.0 oz/week    4 drink(s) per week     3x/wk    Allergies  Allergen Reactions  . Acetaminophen Other (See Comments)    Bothers her  . Codeine     Upset stomach  . Oxycodone-Acetaminophen     Upset stomach  . Vicodin (Hydrocodone-Acetaminophen) Nausea Only    Current Outpatient Prescriptions  Medication Sig Dispense Refill  . acidophilus (RISAQUAD) CAPS Take 2 capsules by mouth daily.        . hydrochlorothiazide (HYDRODIURIL) 25 MG tablet Take 1 tablet (25 mg total) by mouth daily.  30 tablet  0  . meloxicam (MOBIC) 15 MG tablet Take 1 tablet (15 mg total) by mouth daily.  15 tablet  3  . hydrochlorothiazide (HYDRODIURIL) 25 MG tablet Take 1 tablet (25 mg total) by mouth daily.  90 tablet  1  . Lactobacillus Rhamnosus, GG, (CULTURELLE) CAPS Take 1 capsule by mouth daily.  30 capsule  12    Review of Systems Review of Systems  Constitutional: Negative.     Gastrointestinal: Positive for abdominal pain and constipation.  Genitourinary: Negative for dysuria, hematuria, flank pain and vaginal discharge.  Musculoskeletal: Positive for back pain.    Blood pressure 128/95, pulse 73, temperature 97.7 F (36.5 C), height 5\' 6"  (1.676 m), weight 182 lb (82.555 kg), last menstrual period 08/05/2012.  Physical Exam Physical Exam  Constitutional: She appears well-developed. No distress.  Pulmonary/Chest: Effort normal. No respiratory distress.  Abdominal: Soft. She exhibits no distension and no mass. There is no tenderness.  Genitourinary: Vagina normal and uterus normal. No vaginal discharge found.       Cervix is friable and slightly tender with Pap smear. No for cervical motion tenderness. Some fullness on the left side which may be adnexal or stool in the sigmoid colon. Normal right adnexa. Wet prep was done  Skin: Skin is dry.  Psychiatric: She has a normal mood and affect. Her behavior is normal.    Data Reviewed Urgent care visit in August  Assessment    Back pain and left leg pain was most likely musculoskeletal. She has increasing menorrhagia and dysmenorrhea. Questionable left adnexal fullness on examination.    Plan    Pelvic ultrasound scheduled to return for followup after that.       Nysa Sarin 08/17/2012, 10:37 AM

## 2012-08-18 LAB — WET PREP, GENITAL
Trich, Wet Prep: NONE SEEN
Yeast Wet Prep HPF POC: NONE SEEN

## 2012-08-22 ENCOUNTER — Ambulatory Visit (HOSPITAL_COMMUNITY)
Admission: RE | Admit: 2012-08-22 | Discharge: 2012-08-22 | Disposition: A | Discharge status: Home / Self Care | Payer: Self-pay | Source: Ambulatory Visit | Attending: Obstetrics & Gynecology | Admitting: Obstetrics & Gynecology

## 2012-08-22 DIAGNOSIS — N946 Dysmenorrhea, unspecified: Secondary | ICD-10-CM | POA: Insufficient documentation

## 2012-08-22 DIAGNOSIS — D251 Intramural leiomyoma of uterus: Secondary | ICD-10-CM | POA: Insufficient documentation

## 2012-08-22 DIAGNOSIS — D252 Subserosal leiomyoma of uterus: Secondary | ICD-10-CM | POA: Insufficient documentation

## 2012-08-22 DIAGNOSIS — R1032 Left lower quadrant pain: Secondary | ICD-10-CM | POA: Insufficient documentation

## 2012-08-22 DIAGNOSIS — N920 Excessive and frequent menstruation with regular cycle: Secondary | ICD-10-CM | POA: Insufficient documentation

## 2012-08-31 ENCOUNTER — Encounter: Payer: Self-pay | Admitting: Obstetrics & Gynecology

## 2012-08-31 ENCOUNTER — Ambulatory Visit (INDEPENDENT_AMBULATORY_CARE_PROVIDER_SITE_OTHER): Payer: Self-pay | Admitting: Obstetrics & Gynecology

## 2012-08-31 VITALS — BP 119/85 | HR 80 | Temp 97.0°F | Ht 66.0 in | Wt 181.3 lb

## 2012-08-31 DIAGNOSIS — N920 Excessive and frequent menstruation with regular cycle: Secondary | ICD-10-CM

## 2012-08-31 DIAGNOSIS — Z23 Encounter for immunization: Secondary | ICD-10-CM

## 2012-08-31 DIAGNOSIS — N946 Dysmenorrhea, unspecified: Secondary | ICD-10-CM

## 2012-08-31 MED ORDER — INFLUENZA VIRUS VACC SPLIT PF IM SUSP: 0.5000 mL | Freq: Once | INTRAMUSCULAR | Status: DC

## 2012-08-31 MED ORDER — NORGESTIM-ETH ESTRAD TRIPHASIC 0.18/0.215/0.25 MG-25 MCG PO TABS: 1.0000 | ORAL_TABLET | Freq: Every day | ORAL | Status: DC

## 2012-08-31 NOTE — Patient Instructions (Signed)

## 2012-08-31 NOTE — Progress Notes (Signed)
  Subjective:    Patient ID: Katelyn Hardin, female    DOB: [birth year], 41 y.o.   MRN: [id number]  HPIPatient's last menstrual period was 08/05/2012. [id number] Patient's ultrasound was done October 2. It showed several exophytic fibroids that were less than 2 cm. A 4 by 4.8 cm fibroid was seen in the left lower uterine segment. We discussed options for managing her symptoms.    Review of Systems She believes her period may be starting today. She has dyspareunia and pelvic pain and some back pain and left leg pain    Objective:   Physical Exam  Nursing note and vitals reviewed. Constitutional: No distress.  Psychiatric: She has a normal mood and affect. Her behavior is normal.         *RADIOLOGY REPORT*  Clinical Data: Left lower quadrant pain and an erosion. Bilateral  tubal ligation. LMP 08/05/2012.  TRANSABDOMINAL AND TRANSVAGINAL ULTRASOUND OF PELVIS  Technique: Both transabdominal and transvaginal ultrasound  examinations of the pelvis were performed. Transabdominal technique  was performed for global imaging of the pelvis including uterus,  ovaries, adnexal regions, and pelvic cul-de-sac.  It was necessary to proceed with endovaginal exam following the  transabdominal exam to visualize the endometrium.  Comparison: CT abdomen pelvis 07/17/2005  Findings:  Uterus: The uterus measures 9.9 x 6.2 x 5.8 cm and contains several  rounded masses compatible with fibroids. The largest fibroid is  subserosal, extending from the left lower uterine segment,  measuring 4 x 3.7 x 4.8 cm. An intramural fibroid in the left  uterine body measures 2.5 x 2.2 x 2.0 cm. A subserosal exophytic  fibroid from the right uterine fundus measures 1.5 x 1.5 x 1.7 cm.  Exophytic fundal fibroid measures 1.8 x 1.5 x 1.6 cm.  Endometrium: Measures 11.5 mm in thickness and is homogeneous.  Right ovary: Right ovary is somewhat difficult to visualize but  appears normal. Measures 2.3 x 1.6 x 2.6 cm.  No ovarian or  adnexal mass is identified.  Left ovary: Normal appearance/no adnexal mass. Measures 2.9 x 0.9  x 2.2 cm.  Other findings: No free pelvic fluid is visualized.  IMPRESSION:  1.Uterine fibroids. Five discrete fibroids are visualized, the  largest is exophytic fromthe left lower uterine segment, measuring  4.8 cm.  2. Normal ovaries.  Original Report Authenticated By: Britta Mccreedy, M.D.   Assessment & Plan:  Fibroid uterus with pelvic pain and dysmenorrhea. Discussed possibility of transvaginal hysterectomy. She is willing to try medical management and see if this improves her symptoms. I prescribed tri-Sprintec. She will return in 3 months to review her progress.  Dr. Scheryl Darter 08/31/2012 10:10 AM

## 2012-09-03 ENCOUNTER — Telehealth: Payer: Self-pay | Admitting: *Deleted

## 2012-09-03 NOTE — Telephone Encounter (Signed)
Katelyn Hardin called and left a message stating she wants her IUd taken out.

## 2012-09-03 NOTE — Telephone Encounter (Signed)
Correction , patient called and wants someone to call her back, but did not say what the call was about.

## 2012-09-03 NOTE — Telephone Encounter (Signed)
Called and left message we are returning your call. Please call clinic again and leave message on how we may help you.

## 2012-09-04 NOTE — Telephone Encounter (Signed)
Spoke with patient. Patient states that she was told she would have Rx for tri-sprintec called to pharmacy. When patient go to [facility] pharmacy there was Rx for ortho-tricyclen lo which was too expensive. When I tried to put tri-sprintec into epic the ortho-tricyclen lo does come up. I will send a note to Dr. Debroah Loop asking if the triphasic is necessary or if I can send the monophasic sprintec to her pharmacy.

## 2012-09-05 MED ORDER — NORGESTIM-ETH ESTRAD TRIPHASIC 0.18/0.215/0.25 MG-35 MCG PO TABS: 1.0000 | ORAL_TABLET | Freq: Every day | ORAL | Status: DC

## 2012-09-05 NOTE — Telephone Encounter (Signed)
Called pt and informed her that her correct prescription has been sent to [facility]. Pt voiced understanding.

## 2012-09-19 ENCOUNTER — Encounter (HOSPITAL_COMMUNITY): Payer: Self-pay | Admitting: Emergency Medicine

## 2012-09-19 ENCOUNTER — Emergency Department (HOSPITAL_COMMUNITY)
Admission: EM | Admit: 2012-09-19 | Discharge: 2012-09-19 | Disposition: A | Discharge status: Home / Self Care | Payer: Self-pay | Attending: Emergency Medicine | Admitting: Emergency Medicine

## 2012-09-19 DIAGNOSIS — M545 Low back pain, unspecified: Secondary | ICD-10-CM | POA: Insufficient documentation

## 2012-09-19 DIAGNOSIS — IMO0001 Reserved for inherently not codable concepts without codable children: Secondary | ICD-10-CM | POA: Insufficient documentation

## 2012-09-19 DIAGNOSIS — M5442 Lumbago with sciatica, left side: Secondary | ICD-10-CM

## 2012-09-19 DIAGNOSIS — Z79899 Other long term (current) drug therapy: Secondary | ICD-10-CM | POA: Insufficient documentation

## 2012-09-19 DIAGNOSIS — M5430 Sciatica, unspecified side: Secondary | ICD-10-CM | POA: Insufficient documentation

## 2012-09-19 DIAGNOSIS — I10 Essential (primary) hypertension: Secondary | ICD-10-CM | POA: Insufficient documentation

## 2012-09-19 DIAGNOSIS — Z87448 Personal history of other diseases of urinary system: Secondary | ICD-10-CM | POA: Insufficient documentation

## 2012-09-19 HISTORY — DX: Fibromyalgia: M79.7

## 2012-09-19 MED ORDER — TRAMADOL HCL 50 MG PO TABS: 50.0000 mg | ORAL_TABLET | Freq: Four times a day (QID) | ORAL | Status: DC | PRN

## 2012-09-19 NOTE — ED Notes (Signed)
PT. REPORTS LEFT SIDE BODY ACHES FOR SEVERAL MONTHS , DENIES INJURY OR FALL.

## 2012-09-19 NOTE — ED Provider Notes (Signed)
History     CSN: [id number]  Arrival date & time 09/19/12  4098   First MD Initiated Contact with Patient 09/19/12 0703      Chief Complaint  Patient presents with  . Generalized Body Aches    (Consider location/radiation/quality/duration/timing/severity/associated sxs/prior treatment) HPI  Katelyn Hardin is a 41 y.o. female complaining of left-sided low back pain that radiates to the ankle x3 months. Patient denies any trauma, numbness paresthesia, difficulty ambulating, fever, change in bowel or bladder habits, history of IV drug use or cancer. Pain can be severe as 10 out of 10, described as deep and shooting, and is exacerbated when she lays on her left side. Patient takes Motrin for the pain that does alleviate it somewhat.  Past Medical History  Diagnosis Date  . Bacterial vaginosis   . Hypertension   . Anemia   . Fibromyalgia     Past Surgical History  Procedure Date  . Tubal ligation   . Tubal ligation     Family History  Problem Relation Age of Onset  . Thyroid disease Sister   . Cancer Maternal Aunt     breast    History  Substance Use Topics  . Smoking status: Never Smoker   . Smokeless tobacco: Never Used  . Alcohol Use: 2.0 oz/week    4 drink(s) per week     3x/wk    OB History    Grav Para Term Preterm Abortions TAB SAB Ect Mult Living   3 2 2  1  0 1 0 0 2      Review of Systems  Constitutional: Negative for fever.  Respiratory: Negative for shortness of breath.   Cardiovascular: Negative for chest pain.  Gastrointestinal: Negative for nausea, vomiting, abdominal pain and diarrhea.  Musculoskeletal: Positive for back pain.  All other systems reviewed and are negative.    Allergies  Acetaminophen; Codeine; Oxycodone-acetaminophen; and Vicodin  Home Medications   Current Outpatient Rx  Name Route Sig Dispense Refill  . RISAQUAD PO CAPS Oral Take 2 capsules by mouth daily.      Marland Kitchen FERROUS FUMARATE 325 (106 FE) MG PO TABS Oral  Take 1 tablet by mouth 2 (two) times daily.    Marland Kitchen HYDROCHLOROTHIAZIDE 25 MG PO TABS Oral Take 1 tablet (25 mg total) by mouth daily. 90 tablet 1  . MELOXICAM 15 MG PO TABS Oral Take 1 tablet (15 mg total) by mouth daily. 15 tablet 3  . NORGESTIM-ETH ESTRAD TRIPHASIC 0.18/0.215/0.25 MG-35 MCG PO TABS Oral Take 1 tablet by mouth daily. 1 Package 11  . TRAMADOL HCL 50 MG PO TABS Oral Take 1 tablet (50 mg total) by mouth every 6 (six) hours as needed for pain. 15 tablet 0    BP 136/87  Pulse 73  Temp 97.8 F (36.6 C) (Oral)  Resp 14  SpO2 100%  LMP 08/19/2012  Physical Exam  Nursing note and vitals reviewed. Constitutional: She is oriented to person, place, and time. She appears well-developed and well-nourished. No distress.  HENT:  Head: Normocephalic.  Eyes: Conjunctivae normal and EOM are normal. Pupils are equal, round, and reactive to light.  Cardiovascular: Normal rate.   Pulmonary/Chest: Effort normal. No stridor.  Abdominal: Soft. Bowel sounds are normal.  Musculoskeletal: Normal range of motion.       No tenderness to palpation of lumbar area, no muscle spasms, patient indicates with a coordinated gait, distal sensation is intact to pinprick and light touch bilaterally, dorsalis pedis 2+ bilaterally.  Neurological: She is alert and oriented to person, place, and time.  Skin: No rash noted.  Psychiatric: She has a normal mood and affect.    ED Course  Procedures (including critical care time)  Labs Reviewed - No data to display No results found.   1. Lumbago with sciatica of left side       MDM  Patient with normal physical exam, consistent symptoms she is likely pinching I nerve in the lower back I will give her symptomatic pain control and advised her to follow with orthopedics.    Pt verbalized understanding and agrees with care plan. Outpatient follow-up and return precautions given.    New Prescriptions   TRAMADOL (ULTRAM) 50 MG TABLET    Take 1 tablet (50  mg total) by mouth every 6 (six) hours as needed for pain.          Wynetta Emery, PA-C 09/19/12 [phone]

## 2012-09-19 NOTE — ED Provider Notes (Signed)
Medical screening examination/treatment/procedure(s) were performed by non-physician practitioner and as supervising physician I was immediately available for consultation/collaboration.  Seerat Peaden R. Kiyla Ringler, MD 09/19/12 1643 

## 2012-11-23 ENCOUNTER — Encounter: Payer: Self-pay | Admitting: Obstetrics & Gynecology

## 2012-11-23 ENCOUNTER — Ambulatory Visit (INDEPENDENT_AMBULATORY_CARE_PROVIDER_SITE_OTHER): Payer: Self-pay | Admitting: Obstetrics & Gynecology

## 2012-11-23 VITALS — BP 151/103 | HR 86 | Temp 98.7°F | Ht 66.0 in | Wt 185.0 lb

## 2012-11-23 DIAGNOSIS — N920 Excessive and frequent menstruation with regular cycle: Secondary | ICD-10-CM

## 2012-11-23 DIAGNOSIS — Z23 Encounter for immunization: Secondary | ICD-10-CM

## 2012-11-23 DIAGNOSIS — Z111 Encounter for screening for respiratory tuberculosis: Secondary | ICD-10-CM

## 2012-11-23 MED ORDER — TUBERCULIN PPD 5 UNIT/0.1ML ID SOLN
5.0000 [IU] | Freq: Once | INTRADERMAL | Status: AC
  Administered 2012-11-23: 5 [IU] via INTRADERMAL

## 2012-11-23 NOTE — Progress Notes (Signed)
Subjective:     Patient ID: Katelyn Hardin, female   DOB: 1971/05/16, 42 y.o.   MRN: [id number]  [id number] Patient's last menstrual period was 11/03/2012. Didn't take OCP after first month due to side effects, but will consider trying again. Needs PCP, insurance, discussed applying through [employer]. Discussed EM ablation vs Mirena IUD   Review of Systems Heay menses and back pain and cramps    Objective:   Physical Exam  Constitutional: No distress.  Pulmonary/Chest: Effort normal. No respiratory distress.  Psychiatric: She has a normal mood and affect. Her behavior is normal.   Filed Vitals:   11/23/12 1031  BP: 151/103  Pulse: 86  Temp: 98.7 F (37.1 C)  TempSrc: Oral  Height: 5\' 6"  (1.676 m)  Weight: 185 lb (83.915 kg)       Assessment:     Menorrhagia and dysmenorrhea, fibroids     Plan:     Review information on IUD and ablation RTC 4 weeks     ARNOLD,JAMES 11/23/2012 11:28 AM

## 2012-11-23 NOTE — Addendum Note (Signed)
Addended by: Faythe Casa on: 11/23/2012 11:59 AM   Modules accepted: Orders

## 2012-11-23 NOTE — Patient Instructions (Signed)
Endometrial Ablation Endometrial ablation removes the lining of the uterus (endometrium). It is usually a same day, outpatient treatment. Ablation helps avoid major surgery (such as a hysterectomy). A hysterectomy is removal of the cervix and uterus. Endometrial ablation has less risk and complications, has a shorter recovery period and is less expensive. After endometrial ablation, most women will have little or no menstrual bleeding. You may not keep your fertility. Pregnancy is no longer likely after this procedure but if you are pre-menopausal, you still need to use a reliable method of birth control following the procedure because pregnancy can occur. REASONS TO HAVE THE PROCEDURE MAY INCLUDE:  Heavy periods.  Bleeding that is causing anemia.  Anovulatory bleeding, very irregular, bleeding.  Bleeding submucous fibroids (on the lining inside the uterus) if they are smaller than 3 centimeters. REASONS NOT TO HAVE THE PROCEDURE MAY INCLUDE:  You wish to have more children.  You have a pre-cancerous or cancerous problem. The cause of any abnormal bleeding must be diagnosed before having the procedure.  You have pain coming from the uterus.  You have a submucus fibroid larger than 3 centimeters.  You recently had a baby.  You recently had an infection in the uterus.  You have a severe retro-flexed, tipped uterus and cannot insert the instrument to do the ablation.  You had a Cesarean section or deep major surgery on the uterus.  The inner cavity of the uterus is too large for the endometrial ablation instrument. RISKS AND COMPLICATIONS   Perforation of the uterus.  Bleeding.  Infection of the uterus, bladder or vagina.  Injury to surrounding organs.  Cutting the cervix.  An air bubble to the lung (air embolus).  Pregnancy following the procedure.  Failure of the procedure to help the problem requiring hysterectomy.  Decreased ability to diagnose cancer in the lining of  the uterus. BEFORE THE PROCEDURE  The lining of the uterus must be tested to make sure there is no pre-cancerous or cancer cells present.  Medications may be given to make the lining of the uterus thinner.  Ultrasound may be used to evaluate the size and look for abnormalities of the uterus.  Future pregnancy is not desired. PROCEDURE  There are different ways to destroy the lining of the uterus.   Resectoscope - radio frequency-alternating electric current is the most common one used.  Cryotherapy - freezing the lining of the uterus.  Heated Free Liquid - heated salt (saline) solution inserted into the uterus.  Microwave - uses high energy microwaves in the uterus.  Thermal Balloon - a catheter with a balloon tip is inserted into the uterus and filled with heated fluid. Your caregiver will talk with you about the method used in this clinic. They will also instruct you on the pros and cons of the procedure. Endometrial ablation is performed along with a procedure called operative hysteroscopy. A narrow viewing tube is inserted through the birth canal (vagina) and through the cervix into the uterus. A tiny camera attached to the viewing tube (hysteroscope) allows the uterine cavity to be shown on a TV monitor during surgery. Your uterus is filled with a harmless liquid to make the procedure easier. The lining of the uterus is then removed. The lining can also be removed with a resectoscope which allows your surgeon to cut away the lining of the uterus under direct vision. Usually, you will be able to go home within an hour after the procedure. HOME CARE INSTRUCTIONS   Do   not drive for 24 hours.  No tampons, douching or intercourse for 2 weeks or until your caregiver approves.  Rest at home for 24 to 48 hours. You may then resume normal activities unless told differently by your caregiver.  Take your temperature two times a day for 4 days, and record it.  Take any medications your  caregiver has ordered, as directed.  Use some form of contraception if you are pre-menopausal and do not want to get pregnant. Bleeding after the procedure is normal. It varies from light spotting and mildly watery to bloody discharge for 4 to 6 weeks. You may also have mild cramping. Only take over-the-counter or prescription medicines for pain, discomfort, or fever as directed by your caregiver. Do not use aspirin, as this may aggravate bleeding. Frequent urination during the first 24 hours is normal. You will not know how effective your surgery is until at least 3 months after the surgery. SEEK IMMEDIATE MEDICAL CARE IF:   Bleeding is heavier than a normal menstrual cycle.  An oral temperature above 102 F (38.9 C) develops.  You have increasing cramps or pains not relieved with medication or develop belly (abdominal) pain which does not seem to be related to the same area of earlier cramping and pain.  You are light headed, weak or have fainting episodes.  You develop pain in the shoulder strap areas.  You have chest or leg pain.  You have abnormal vaginal discharge.  You have painful urination. Document Released: 09/16/2004 Document Revised: 01/30/2012 Document Reviewed: 12/15/2007 [facility] Patient Information 2013 [place], [place]. Levonorgestrel intrauterine device (IUD) What is this medicine? LEVONORGESTREL IUD (LEE voe nor jes trel) is a contraceptive (birth control) device. The device is placed inside the uterus by a healthcare professional. It is used to prevent pregnancy and can also be used to treat heavy bleeding that occurs during your period. Depending on the device, it can be used for 3 to 5 years. This medicine may be used for other purposes; ask your health care provider or pharmacist if you have questions. What should I tell my health care provider before I take this medicine? They need to know if you have any of these conditions: -abnormal Pap smear -cancer of the  breast, uterus, or cervix -diabetes -endometritis -genital or pelvic infection now or in the past -have more than one sexual partner or your partner has more than one partner -heart disease -history of an ectopic or tubal pregnancy -immune system problems -IUD in place -liver disease or tumor -problems with blood clots or take blood-thinners -use intravenous drugs -uterus of unusual shape -vaginal bleeding that has not been explained -an unusual or allergic reaction to levonorgestrel, other hormones, silicone, or polyethylene, medicines, foods, dyes, or preservatives -pregnant or trying to get pregnant -breast-feeding How should I use this medicine? This device is placed inside the uterus by a health care professional. Talk to your pediatrician regarding the use of this medicine in children. Special care may be needed. Overdosage: If you think you have taken too much of this medicine contact a poison control center or emergency room at once. NOTE: This medicine is only for you. Do not share this medicine with others. What if I miss a dose? This does not apply. What may interact with this medicine? Do not take this medicine with any of the following medications: -amprenavir -bosentan -fosamprenavir This medicine may also interact with the following medications: -aprepitant -barbiturate medicines for inducing sleep or treating seizures -bexarotene -griseofulvin -medicines  to treat seizures like carbamazepine, ethotoin, felbamate, oxcarbazepine, phenytoin, topiramate -modafinil -pioglitazone -rifabutin -rifampin -rifapentine -some medicines to treat HIV infection like atazanavir, indinavir, lopinavir, nelfinavir, tipranavir, ritonavir -St. John's wort -warfarin This list may not describe all possible interactions. Give your health care provider a list of all the medicines, herbs, non-prescription drugs, or dietary supplements you use. Also tell them if you smoke, drink  alcohol, or use illegal drugs. Some items may interact with your medicine. What should I watch for while using this medicine? Visit your doctor or health care professional for regular check ups. See your doctor if you or your partner has sexual contact with others, becomes HIV positive, or gets a sexual transmitted disease. This product does not protect you against HIV infection (AIDS) or other sexually transmitted diseases. You can check the placement of the IUD yourself by reaching up to the top of your vagina with clean fingers to feel the threads. Do not pull on the threads. It is a good habit to check placement after each menstrual period. Call your doctor right away if you feel more of the IUD than just the threads or if you cannot feel the threads at all. The IUD may come out by itself. You may become pregnant if the device comes out. If you notice that the IUD has come out use a backup birth control method like condoms and call your health care provider. Using tampons will not change the position of the IUD and are okay to use during your period. What side effects may I notice from receiving this medicine? Side effects that you should report to your doctor or health care professional as soon as possible: -allergic reactions like skin rash, itching or hives, swelling of the face, lips, or tongue -fever, flu-like symptoms -genital sores -high blood pressure -no menstrual period for 6 weeks during use -pain, swelling, warmth in the leg -pelvic pain or tenderness -severe or sudden headache -signs of pregnancy -stomach cramping -sudden shortness of breath -trouble with balance, talking, or walking -unusual vaginal bleeding, discharge -yellowing of the eyes or skin Side effects that usually do not require medical attention (report to your doctor or health care professional if they continue or are bothersome): -acne -breast pain -change in sex drive or performance -changes in  weight -cramping, dizziness, or faintness while the device is being inserted -headache -irregular menstrual bleeding within first 3 to 6 months of use -nausea This list may not describe all possible side effects. Call your doctor for medical advice about side effects. You may report side effects to FDA at 1-800-FDA-1088. Where should I keep my medicine? This does not apply. NOTE: This sheet is a summary. It may not cover all possible information. If you have questions about this medicine, talk to your doctor, pharmacist, or health care provider.  2013, [employer]vier/[employer] Standard. (12/08/2011 1:54:04 PM)

## 2012-11-26 ENCOUNTER — Encounter: Payer: Self-pay | Admitting: *Deleted

## 2012-11-26 ENCOUNTER — Telehealth: Payer: Self-pay | Admitting: *Deleted

## 2012-11-26 NOTE — Telephone Encounter (Signed)
Aron presented to clinic to have tb skin test read- result negative. Letter given

## 2012-12-24 ENCOUNTER — Ambulatory Visit: Payer: Self-pay | Admitting: Obstetrics & Gynecology

## 2013-01-21 ENCOUNTER — Emergency Department (HOSPITAL_COMMUNITY)
Admission: EM | Admit: 2013-01-21 | Discharge: 2013-01-21 | Disposition: A | Discharge status: Home / Self Care | Payer: Self-pay | Attending: Emergency Medicine | Admitting: Emergency Medicine

## 2013-01-21 ENCOUNTER — Emergency Department (HOSPITAL_COMMUNITY): Payer: Self-pay

## 2013-01-21 DIAGNOSIS — M5432 Sciatica, left side: Secondary | ICD-10-CM

## 2013-01-21 DIAGNOSIS — M5430 Sciatica, unspecified side: Secondary | ICD-10-CM | POA: Insufficient documentation

## 2013-01-21 DIAGNOSIS — Z8739 Personal history of other diseases of the musculoskeletal system and connective tissue: Secondary | ICD-10-CM | POA: Insufficient documentation

## 2013-01-21 DIAGNOSIS — D649 Anemia, unspecified: Secondary | ICD-10-CM | POA: Insufficient documentation

## 2013-01-21 DIAGNOSIS — Z3202 Encounter for pregnancy test, result negative: Secondary | ICD-10-CM | POA: Insufficient documentation

## 2013-01-21 DIAGNOSIS — Z8742 Personal history of other diseases of the female genital tract: Secondary | ICD-10-CM | POA: Insufficient documentation

## 2013-01-21 DIAGNOSIS — Z79899 Other long term (current) drug therapy: Secondary | ICD-10-CM | POA: Insufficient documentation

## 2013-01-21 DIAGNOSIS — I10 Essential (primary) hypertension: Secondary | ICD-10-CM | POA: Insufficient documentation

## 2013-01-21 LAB — URINE MICROSCOPIC-ADD ON

## 2013-01-21 LAB — URINALYSIS, ROUTINE W REFLEX MICROSCOPIC
Glucose, UA: NEGATIVE mg/dL
Hgb urine dipstick: NEGATIVE
Ketones, ur: 15 mg/dL — AB
Protein, ur: NEGATIVE mg/dL
pH: 5.5 (ref 5.0–8.0)

## 2013-01-21 MED ORDER — CYCLOBENZAPRINE HCL 5 MG PO TABS: 5.0000 mg | ORAL_TABLET | Freq: Three times a day (TID) | ORAL | Status: DC | PRN

## 2013-01-21 MED ORDER — TRAMADOL HCL 50 MG PO TABS: 50.0000 mg | ORAL_TABLET | Freq: Four times a day (QID) | ORAL | Status: DC | PRN

## 2013-01-21 MED ORDER — IBUPROFEN 600 MG PO TABS: 600.0000 mg | ORAL_TABLET | Freq: Four times a day (QID) | ORAL | Status: DC | PRN

## 2013-01-21 NOTE — ED Notes (Signed)
Back and flank pain that rad down left  leg x months she states. States it is aching pain  It is an inside pain she states no n/v, occ  Hard to take a deep  Breath . No dysuria  Or vag d/c just found out in sept she has fibroids

## 2013-01-21 NOTE — ED Provider Notes (Signed)
Medical screening examination/treatment/procedure(s) were conducted as a shared visit with non-physician practitioner(s) and myself.  I personally evaluated the patient during the encounter  Ankit Nanavati, MD 01/21/13 1824 

## 2013-01-21 NOTE — ED Notes (Signed)
[place], PA in with pt at this time

## 2013-01-21 NOTE — ED Provider Notes (Signed)
History     CSN: [id number]  Arrival date & time 01/21/13  1478   First MD Initiated Contact with Patient 01/21/13 [phone]      No chief complaint on file.   (Consider location/radiation/quality/duration/timing/severity/associated sxs/prior treatment) HPI Comments: Katelyn Hardin is a 42 y.o. Female presenting with acute on chronic aching left low back pain which has which has been present for the past several months, but worse this week.  Patient denies any new injury specifically.  There is radiation into her left lower posterior buttock which radiates to her left upper calf.  There has been no weakness or numbness in the lower extremities and no urinary or bowel retention or incontinence, but reports urinary urgency at times.  Patient does not have a history of cancer or IVDU.  She has taken tramadol in the past which has relieves her symptoms.  She has taken no medicines today.      The history is provided by the patient.    Past Medical History  Diagnosis Date  . Bacterial vaginosis   . Hypertension   . Anemia   . Fibromyalgia     Past Surgical History  Procedure Laterality Date  . Tubal ligation    . Tubal ligation      Family History  Problem Relation Age of Onset  . Thyroid disease Sister   . Cancer Maternal Aunt     breast    History  Substance Use Topics  . Smoking status: Never Smoker   . Smokeless tobacco: Never Used  . Alcohol Use: 2.0 oz/week    4 drink(s) per week     Comment: 3x/wk    OB History   Grav Para Term Preterm Abortions TAB SAB Ect Mult Living   3 2 2  1  0 1 0 0 2      Review of Systems  Constitutional: Negative for fever.  Respiratory: Negative for shortness of breath.   Cardiovascular: Negative for chest pain and leg swelling.  Gastrointestinal: Negative for abdominal pain, constipation and abdominal distention.  Genitourinary: Negative for dysuria, urgency, frequency, flank pain and difficulty urinating.   Musculoskeletal: Positive for back pain. Negative for joint swelling and gait problem.  Skin: Negative for rash.  Neurological: Negative for weakness and numbness.    Allergies  Acetaminophen; Codeine; Oxycodone-acetaminophen; and Vicodin  Home Medications   Current Outpatient Rx  Name  Route  Sig  Dispense  Refill  . ferrous fumarate (HEMOCYTE - 106 MG FE) 325 (106 FE) MG TABS   Oral   Take 1 tablet by mouth 2 (two) times daily.         . hydrochlorothiazide (HYDRODIURIL) 25 MG tablet   Oral   Take 1 tablet (25 mg total) by mouth daily.   90 tablet   1   . Multiple Vitamin (MULTIVITAMIN WITH MINERALS) TABS   Oral   Take 1 tablet by mouth daily.         . cyclobenzaprine (FLEXERIL) 5 MG tablet   Oral   Take 1 tablet (5 mg total) by mouth 3 (three) times daily as needed for muscle spasms.   15 tablet   0   . ibuprofen (ADVIL,MOTRIN) 600 MG tablet   Oral   Take 1 tablet (600 mg total) by mouth every 6 (six) hours as needed for pain.   30 tablet   0   . traMADol (ULTRAM) 50 MG tablet   Oral   Take 1 tablet (50 mg total) by  mouth every 6 (six) hours as needed for pain.   15 tablet   0     BP 130/93  Pulse 85  Temp(Src) 98.1 F (36.7 C) (Oral)  Resp 20  SpO2 100%  Physical Exam  Nursing note and vitals reviewed. Constitutional: She appears well-developed and well-nourished.  HENT:  Head: Normocephalic.  Eyes: Conjunctivae are normal.  Neck: Normal range of motion. Neck supple.  Cardiovascular: Normal rate and intact distal pulses.   Pedal pulses normal.  Pulmonary/Chest: Effort normal.  Abdominal: Soft. Bowel sounds are normal. She exhibits no distension and no mass.  Musculoskeletal: Normal range of motion. She exhibits no edema.       Lumbar back: She exhibits tenderness. She exhibits no swelling, no edema and no spasm.  Left paralumbar ttp. Left si joint ttp which elicits radiation down thigh.  Neurological: She is alert. She has normal strength.  She displays no atrophy and no tremor. No sensory deficit. Gait normal.  Reflex Scores:      Patellar reflexes are 2+ on the right side and 2+ on the left side.      Achilles reflexes are 2+ on the right side and 2+ on the left side. No strength deficit noted in hip and knee flexor and extensor muscle groups.  Ankle flexion and extension intact.    Skin: Skin is warm and dry.  Psychiatric: She has a normal mood and affect.    ED Course  Procedures (including critical care time)  Labs Reviewed  URINALYSIS, ROUTINE W REFLEX MICROSCOPIC - Abnormal; Notable for the following:    APPearance CLOUDY (*)    Specific Gravity, Urine 1.031 (*)    Bilirubin Urine SMALL (*)    Ketones, ur 15 (*)    Leukocytes, UA TRACE (*)    All other components within normal limits  URINE MICROSCOPIC-ADD ON - Abnormal; Notable for the following:    Squamous Epithelial / LPF MANY (*)    Bacteria, UA FEW (*)    All other components within normal limits  PREGNANCY, URINE   Dg Lumbar Spine Complete  01/21/2013  *RADIOLOGY REPORT*  Clinical Data: Low back pain with left lower extremity radiculopathy.  LUMBAR SPINE - COMPLETE 4+ VIEW  Comparison: None.  Findings: The lumbar spine demonstrates normal alignment and no evidence of fracture, subluxation or significant degenerative disease.  Disc space heights are well preserved.  No bony lesions are identified.  IMPRESSION: Normal lumbar spine radiographs.   Original Report Authenticated By: Irish Lack, M.D.      1. Sciatica neuralgia, left       MDM  Patients labs and/or radiological studies were reviewed during the medical decision making and disposition process.  No neuro deficit on exam or by history to suggest emergent or surgical presentation.  Also discussed worsened sx that should prompt immediate re-evaluation including distal weakness, bowel/bladder retention/incontinence.  Pt prescribed tramadol, ibuprofen, flexeril.  Heat therapy.  Referrals  given.              Burgess Amor, [place] 01/21/13 1220

## 2013-01-21 NOTE — ED Notes (Signed)
Pt getting undressed and into a gown at this time; pt provided an urine specimen

## 2013-01-30 ENCOUNTER — Telehealth: Payer: Self-pay | Admitting: *Deleted

## 2013-01-30 NOTE — Telephone Encounter (Signed)
Discussed with Dr. Debroah Loop and he prefers [facility] call the new providers that are seeing patients formerly seen at [facility] them office number of [phone].

## 2013-01-30 NOTE — Telephone Encounter (Signed)
Patient left a message requesting that Dr. Debroah Loop refill her medicine. She uses [facility].

## 2013-01-30 NOTE — Telephone Encounter (Signed)
[facility] called and left a message stating Katelyn Hardin was requesting a refill of her desinide ointment 0.05% last got 45 gram tube 06/27/12 Dr. Yisroel Ramming - [facility] states called here because [facility] closed and patient requested call Dr, Debroah Loop

## 2013-01-30 NOTE — Telephone Encounter (Signed)
Refer to [facility] urgent care

## 2013-01-30 NOTE — Telephone Encounter (Signed)
Also called patient and informed her we are referring the renewal to the doctors that are now seeing patients formerly seen by [facility]- gave her office number so she can follow up

## 2013-02-05 ENCOUNTER — Telehealth: Payer: Self-pay

## 2013-02-05 NOTE — Telephone Encounter (Signed)
[name] from [facility] called about a refill for desoinide 0.05% ointment ordered by Dr. Debroah Loop.

## 2013-02-05 NOTE — Telephone Encounter (Signed)
[facility] and spoke w/Sandra. She stated that this ointment had originally been prescribed for pt by [facility] and was used for "itchy dry skin". I stated that Dr. Debroah Loop is a Gynecologist and that since this med is not for a Gyn problem and has never been prescribed for her by Dr. Debroah Loop, we will not be able to provide the order. Pt will need to contact her PCP.  [name] voiced understanding.

## 2013-02-11 ENCOUNTER — Emergency Department (HOSPITAL_COMMUNITY)
Admission: EM | Admit: 2013-02-11 | Discharge: 2013-02-11 | Disposition: A | Discharge status: Home / Self Care | Payer: Self-pay | Source: Home / Self Care | Attending: Family Medicine | Admitting: Family Medicine

## 2013-02-11 ENCOUNTER — Encounter (HOSPITAL_COMMUNITY): Payer: Self-pay

## 2013-02-11 DIAGNOSIS — E559 Vitamin D deficiency, unspecified: Secondary | ICD-10-CM

## 2013-02-11 DIAGNOSIS — D649 Anemia, unspecified: Secondary | ICD-10-CM

## 2013-02-11 DIAGNOSIS — R5381 Other malaise: Secondary | ICD-10-CM

## 2013-02-11 DIAGNOSIS — I10 Essential (primary) hypertension: Secondary | ICD-10-CM

## 2013-02-11 DIAGNOSIS — K589 Irritable bowel syndrome without diarrhea: Secondary | ICD-10-CM

## 2013-02-11 DIAGNOSIS — N920 Excessive and frequent menstruation with regular cycle: Secondary | ICD-10-CM

## 2013-02-11 DIAGNOSIS — L408 Other psoriasis: Secondary | ICD-10-CM

## 2013-02-11 MED ORDER — HYDROCHLOROTHIAZIDE 25 MG PO TABS: 25.0000 mg | ORAL_TABLET | Freq: Every day | ORAL | Status: DC

## 2013-02-11 MED ORDER — DESONIDE 0.05 % EX CREA: TOPICAL_CREAM | Freq: Two times a day (BID) | CUTANEOUS | Status: DC

## 2013-02-11 NOTE — ED Notes (Signed)
Patient has a history of HTN 

## 2013-02-11 NOTE — ED Provider Notes (Signed)
History    CSN: [id number]  Arrival date & time 02/11/13  1757   First MD Initiated Contact with Patient 02/11/13 2017     Chief Complaint  Patient presents with  . Medication Refill   The history is provided by the patient.   Pt reports more flatus lately and IBS symptoms.  Pt needs a GYN referral for her uterine fibroids and menorrhagia that has been getting worse over last 6 months.  Pt reports that she needs refills of her BP meds.   Pt has menorrhagia.  Pt says that she is taking iron tablets daily.     Past Medical History  Diagnosis Date  . Bacterial vaginosis   . Hypertension   . Anemia   . Fibromyalgia     Past Surgical History  Procedure Laterality Date  . Tubal ligation    . Tubal ligation      Family History  Problem Relation Age of Onset  . Thyroid disease Sister   . Cancer Maternal Aunt     breast    History  Substance Use Topics  . Smoking status: Never Smoker   . Smokeless tobacco: Never Used  . Alcohol Use: 2.0 oz/week    4 drink(s) per week     Comment: 3x/wk    OB History   Grav Para Term Preterm Abortions TAB SAB Ect Mult Living   3 2 2  1  0 1 0 0 2      Review of Systems  Constitutional: Positive for fatigue. Negative for unexpected weight change.  HENT: Positive for congestion.   Genitourinary: Positive for menstrual problem.  Neurological: Negative.  Negative for weakness.  Hematological: Negative.   Psychiatric/Behavioral: Negative.   All other systems reviewed and are negative.    Allergies  Acetaminophen; Codeine; Oxycodone-acetaminophen; and Vicodin  Home Medications   Current Outpatient Rx  Name  Route  Sig  Dispense  Refill  . desonide (DESOWEN) 0.05 % cream   Topical   Apply topically 2 (two) times daily.         . hydrochlorothiazide (HYDRODIURIL) 25 MG tablet   Oral   Take 1 tablet (25 mg total) by mouth daily.   90 tablet   1   . cyclobenzaprine (FLEXERIL) 5 MG tablet   Oral   Take 1 tablet (5 mg  total) by mouth 3 (three) times daily as needed for muscle spasms.   15 tablet   0   . ferrous fumarate (HEMOCYTE - 106 MG FE) 325 (106 FE) MG TABS   Oral   Take 1 tablet by mouth 2 (two) times daily.         Marland Kitchen ibuprofen (ADVIL,MOTRIN) 600 MG tablet   Oral   Take 1 tablet (600 mg total) by mouth every 6 (six) hours as needed for pain.   30 tablet   0   . Multiple Vitamin (MULTIVITAMIN WITH MINERALS) TABS   Oral   Take 1 tablet by mouth daily.         . traMADol (ULTRAM) 50 MG tablet   Oral   Take 1 tablet (50 mg total) by mouth every 6 (six) hours as needed for pain.   15 tablet   0     There were no vitals taken for this visit.  Physical Exam  ED Course  Procedures (including critical care time)  Labs Reviewed - No data to display No results found.  No diagnosis found.  MDM  IMPRESSION  Menorrhagia  Uterine leiomyoma  Anemia  Vit D deficiency  Sciatica  Hypertension  Psoriasis  RECOMMENDATIONS / PLAN Ordered labs to be done in 2 weeks - future orders placed in chart Refilled her medications for HTN and psoriasis Continue iron supplement Pt advised to get a discount card ASAP so that she can be referred to gynecology for fibroid evaluation and treatment.   Pt needs labs done: says she will come in 2 weeks, future orders placed  FOLLOW UP 2 weeks  The patient was given clear instructions to go to ER or return to medical center if symptoms don't improve, worsen or new problems develop.  The patient verbalized understanding.  The patient was told to call to get lab results if they haven't heard anything in the next week.            Cleora Fleet, MD 02/12/13 [phone]

## 2013-02-22 ENCOUNTER — Emergency Department (HOSPITAL_COMMUNITY)
Admission: EM | Admit: 2013-02-22 | Discharge: 2013-02-22 | Disposition: A | Discharge status: Home / Self Care | Payer: Self-pay | Source: Home / Self Care

## 2013-02-22 LAB — CBC
HCT: 35.7 % — ABNORMAL LOW (ref 36.0–46.0)
MCH: 32.4 pg (ref 26.0–34.0)
MCV: 93.2 fL (ref 78.0–100.0)
RBC: 3.83 MIL/uL — ABNORMAL LOW (ref 3.87–5.11)
WBC: 5.5 10*3/uL (ref 4.0–10.5)

## 2013-02-22 LAB — COMPREHENSIVE METABOLIC PANEL
ALT: 30 U/L (ref 0–35)
AST: 37 U/L (ref 0–37)
BUN: 11 mg/dL (ref 6–23)
Glucose, Bld: 98 mg/dL (ref 70–99)
Potassium: 3.5 mEq/L (ref 3.5–5.1)
Total Protein: 7.3 g/dL (ref 6.0–8.3)

## 2013-02-22 LAB — LIPID PANEL
Cholesterol: 149 mg/dL (ref 0–200)
Total CHOL/HDL Ratio: 3.1 RATIO

## 2013-02-22 NOTE — ED Notes (Signed)
Patient here for blood work Lipid,cmet,vit D, b12,cbc,tsh

## 2013-02-22 NOTE — ED Notes (Signed)
Unable to obtain labs- EMT and RN made an attemp Patient is going to try to hydrate during the day and will return later today for [place] to draw her labs

## 2013-02-22 NOTE — ED Notes (Signed)
We were unable to obtain labs this am Patient returned this evening to have labs drawn

## 2013-02-25 ENCOUNTER — Telehealth (HOSPITAL_COMMUNITY): Payer: Self-pay

## 2013-02-25 LAB — HEMOGLOBIN A1C: Mean Plasma Glucose: 97 mg/dL (ref <117)

## 2013-02-25 NOTE — ED Notes (Signed)
Patient returned call to office-lab results given Will be in Friday 4/11 for additional lab work Cmp, intact PTH and vit D

## 2013-04-04 ENCOUNTER — Ambulatory Visit: Payer: Self-pay | Attending: Family Medicine

## 2013-04-04 DIAGNOSIS — D649 Anemia, unspecified: Secondary | ICD-10-CM

## 2013-04-04 DIAGNOSIS — N920 Excessive and frequent menstruation with regular cycle: Secondary | ICD-10-CM

## 2013-04-04 DIAGNOSIS — E559 Vitamin D deficiency, unspecified: Secondary | ICD-10-CM

## 2013-04-04 DIAGNOSIS — I10 Essential (primary) hypertension: Secondary | ICD-10-CM

## 2013-04-04 LAB — LIPID PANEL: Total CHOL/HDL Ratio: 3.6 Ratio

## 2013-04-04 LAB — COMPLETE METABOLIC PANEL WITH GFR
Albumin: 4.2 g/dL (ref 3.5–5.2)
BUN: 13 mg/dL (ref 6–23)
CO2: 26 mEq/L (ref 19–32)
Calcium: 11.1 mg/dL — ABNORMAL HIGH (ref 8.4–10.5)
Chloride: 104 mEq/L (ref 96–112)
GFR, Est African American: >89 mL/min
GFR, Est Non African American: >89 mL/min
Glucose, Bld: 89 mg/dL (ref 70–99)
Potassium: 4.3 mEq/L (ref 3.5–5.3)
Sodium: 134 mEq/L — ABNORMAL LOW (ref 135–145)
Total Protein: 7.3 g/dL (ref 6.0–8.3)

## 2013-04-04 LAB — CBC
MCH: 31.9 pg (ref 26.0–34.0)
MCHC: 33.9 g/dL (ref 30.0–36.0)
MCV: 94.2 fL (ref 78.0–100.0)
Platelets: 313 10*3/uL (ref 150–400)
RBC: 4.32 MIL/uL (ref 3.87–5.11)
RDW: 13.8 % (ref 11.5–15.5)

## 2013-04-04 LAB — TSH: TSH: 1.118 u[IU]/mL (ref 0.350–4.500)

## 2013-04-04 NOTE — Progress Notes (Unsigned)
Talked with Dr. Laural Benes about patient's labs and instructed patient to return to review labs in one week. I have scheduled patient to return 04/11/13 at 10am.

## 2013-04-05 LAB — VITAMIN D 25 HYDROXY (VIT D DEFICIENCY, FRACTURES): Vit D, 25-Hydroxy: 32 ng/mL (ref 30–89)

## 2013-04-05 NOTE — Progress Notes (Signed)
Quick Note:  Labs reviewed with patient. I asked her to please come into office in 1 week to discuss results and check PTH level as workup for hypercalcemia. Pt scheduled for 5/22 at [facility], MD  ______

## 2013-04-11 ENCOUNTER — Ambulatory Visit: Payer: Self-pay

## 2013-04-12 ENCOUNTER — Telehealth: Payer: Self-pay | Admitting: *Deleted

## 2013-04-12 NOTE — Telephone Encounter (Signed)
Pt missed called about blood work results and would like call back.  She says that if she is unable to reach the phone results can be left on her voicemail.

## 2013-04-16 ENCOUNTER — Telehealth: Payer: Self-pay | Admitting: *Deleted

## 2013-04-17 ENCOUNTER — Telehealth: Payer: Self-pay | Admitting: *Deleted

## 2013-04-17 NOTE — Telephone Encounter (Signed)
04/17/13  Call returned to patient regarding blood work results which had already been reviewed with patient by Dr.Johnson. Informed patient that no other blood work has resulted. P.Johnmark Geiger,RN

## 2013-04-17 NOTE — Telephone Encounter (Signed)
04/17/13 Patient request for Dr. Laural Benes to call her regarding lab results. According to Dr.Johnson note patient had appointment schedule for 04/11/13 in which he had planned to discuss lab results.Patient stated she had to cancel her appointment. P.Romari Gasparro,RN

## 2013-04-26 ENCOUNTER — Ambulatory Visit: Payer: Self-pay

## 2013-06-05 ENCOUNTER — Other Ambulatory Visit: Payer: Self-pay

## 2013-06-06 ENCOUNTER — Other Ambulatory Visit (INDEPENDENT_AMBULATORY_CARE_PROVIDER_SITE_OTHER): Payer: Self-pay | Admitting: General Practice

## 2013-06-06 DIAGNOSIS — N390 Urinary tract infection, site not specified: Secondary | ICD-10-CM

## 2013-06-06 LAB — POCT URINALYSIS DIP (DEVICE)
Bilirubin Urine: NEGATIVE
Glucose, UA: NEGATIVE mg/dL
Specific Gravity, Urine: 1.025 (ref 1.005–1.030)
Urobilinogen, UA: 1 mg/dL (ref 0.0–1.0)

## 2013-06-06 NOTE — Progress Notes (Signed)
Patient dropped off urine this morning but stated it was fine to leave information on her voicemail. Called patient and informed her that we are sending her urine off for culture to see what antibiotic would be best to treat the possible UTI & when we get the results back we will call her.

## 2013-06-09 LAB — URINE CULTURE: Colony Count: >=100000

## 2013-06-10 ENCOUNTER — Telehealth: Payer: Self-pay | Admitting: General Practice

## 2013-06-10 DIAGNOSIS — N390 Urinary tract infection, site not specified: Secondary | ICD-10-CM

## 2013-06-10 MED ORDER — SULFAMETHOXAZOLE-TMP DS 800-160 MG PO TABS: 1.0000 | ORAL_TABLET | Freq: Two times a day (BID) | ORAL | Status: DC

## 2013-06-10 NOTE — Telephone Encounter (Signed)
Called patient and informed her of results and medication available for pickup at her [facility] pharmacy. Patient verbalized understanding and had no further questions

## 2013-06-10 NOTE — Telephone Encounter (Signed)
Message copied by Kathee Delton on Mon Jun 10, 2013  4:32 PM ------      Message from: Adam Phenix      Created: Mon Jun 10, 2013 11:11 AM       Bactrim DS BID 7 days UTI ------

## 2013-06-21 ENCOUNTER — Telehealth: Payer: Self-pay | Admitting: *Deleted

## 2013-06-21 MED ORDER — FLUCONAZOLE 150 MG PO TABS: 150.0000 mg | ORAL_TABLET | Freq: Once | ORAL | Status: DC

## 2013-06-21 NOTE — Telephone Encounter (Signed)
Pt left message stating that she had received antibiotic for a bladder infection and now has sx of vaginal yeast- itching. She requested Rx for diflucan. I called pt and informed her that I will send Rx to her pharmacy and she may pick it up later today. She may also use hydrocortisone cream for the external itching.  Pt voiced understanding.

## 2013-07-02 ENCOUNTER — Ambulatory Visit: Payer: Self-pay | Attending: Family Medicine | Admitting: Internal Medicine

## 2013-07-02 ENCOUNTER — Encounter: Payer: Self-pay | Admitting: Internal Medicine

## 2013-07-02 ENCOUNTER — Other Ambulatory Visit: Payer: Self-pay | Admitting: Obstetrics & Gynecology

## 2013-07-02 VITALS — BP 138/97 | HR 67 | Temp 97.9°F | Ht 66.0 in | Wt 180.2 lb

## 2013-07-02 DIAGNOSIS — R42 Dizziness and giddiness: Secondary | ICD-10-CM | POA: Insufficient documentation

## 2013-07-02 DIAGNOSIS — Z1231 Encounter for screening mammogram for malignant neoplasm of breast: Secondary | ICD-10-CM

## 2013-07-02 DIAGNOSIS — I10 Essential (primary) hypertension: Secondary | ICD-10-CM

## 2013-07-02 DIAGNOSIS — H8113 Benign paroxysmal vertigo, bilateral: Secondary | ICD-10-CM | POA: Insufficient documentation

## 2013-07-02 DIAGNOSIS — M5430 Sciatica, unspecified side: Secondary | ICD-10-CM | POA: Insufficient documentation

## 2013-07-02 DIAGNOSIS — H8110 Benign paroxysmal vertigo, unspecified ear: Secondary | ICD-10-CM

## 2013-07-02 DIAGNOSIS — E559 Vitamin D deficiency, unspecified: Secondary | ICD-10-CM

## 2013-07-02 MED ORDER — DESONIDE 0.05 % EX CREA: TOPICAL_CREAM | Freq: Two times a day (BID) | CUTANEOUS | Status: DC

## 2013-07-02 MED ORDER — MECLIZINE HCL 25 MG PO TABS: 25.0000 mg | ORAL_TABLET | Freq: Three times a day (TID) | ORAL | Status: DC | PRN

## 2013-07-02 MED ORDER — GABAPENTIN 300 MG PO CAPS: 300.0000 mg | ORAL_CAPSULE | Freq: Three times a day (TID) | ORAL | Status: DC

## 2013-07-02 MED ORDER — HYDROCHLOROTHIAZIDE 25 MG PO TABS: 25.0000 mg | ORAL_TABLET | Freq: Every day | ORAL | Status: DC

## 2013-07-02 MED ORDER — VITAMIN D (ERGOCALCIFEROL) 1.25 MG (50000 UNIT) PO CAPS: 50000.0000 [IU] | ORAL_CAPSULE | ORAL | Status: DC

## 2013-07-02 NOTE — Progress Notes (Signed)
Patient ID: Katelyn Hardin, female   DOB: Sep 12, 1971, 42 y.o.   MRN: [id number]  CC: Followup  HPI: Patient was seen in the clinic for follow. Complained of dizziness, spinning. No ear discharge, no ringing in ear. Dizziness is mostly during the day. She still has the sciatica pain.  Allergies  Allergen Reactions  . Acetaminophen Other (See Comments)    Bothers her  . Codeine     Upset stomach  . Oxycodone-Acetaminophen     Upset stomach  . Vicodin (Hydrocodone-Acetaminophen) Nausea Only   Past Medical History  Diagnosis Date  . Bacterial vaginosis   . Hypertension   . Anemia   . Fibromyalgia    Current Outpatient Prescriptions on File Prior to Visit  Medication Sig Dispense Refill  . cyclobenzaprine (FLEXERIL) 5 MG tablet Take 1 tablet (5 mg total) by mouth 3 (three) times daily as needed for muscle spasms.  15 tablet  0  . ferrous fumarate (HEMOCYTE - 106 MG FE) 325 (106 FE) MG TABS Take 1 tablet by mouth 2 (two) times daily.      . fluconazole (DIFLUCAN) 150 MG tablet Take 1 tablet (150 mg total) by mouth once.  1 tablet  0  . ibuprofen (ADVIL,MOTRIN) 600 MG tablet Take 1 tablet (600 mg total) by mouth every 6 (six) hours as needed for pain.  30 tablet  0  . Multiple Vitamin (MULTIVITAMIN WITH MINERALS) TABS Take 1 tablet by mouth daily.      Marland Kitchen sulfamethoxazole-trimethoprim (BACTRIM DS) 800-160 MG per tablet Take 1 tablet by mouth 2 (two) times daily.  14 tablet  0  . traMADol (ULTRAM) 50 MG tablet Take 1 tablet (50 mg total) by mouth every 6 (six) hours as needed for pain.  15 tablet  0   No current facility-administered medications on file prior to visit.   Family History  Problem Relation Age of Onset  . Thyroid disease Sister   . Cancer Maternal Aunt     breast   History   Social History  . Marital Status: Married    Spouse Name: N/A    Number of Children: N/A  . Years of Education: N/A   Occupational History  . Not on file.   Social History Main  Topics  . Smoking status: Never Smoker   . Smokeless tobacco: Never Used  . Alcohol Use: 2.0 oz/week    4 drink(s) per week     Comment: 3x/wk  . Drug Use: No  . Sexually Active: Yes    Birth Control/ Protection: Surgical, Condom   Other Topics Concern  . Not on file   Social History Narrative  . No narrative on file    Review of Systems: Constitutional: Negative for fever, chills, diaphoresis, activity change, appetite change and fatigue. HENT: Negative for ear pain, nosebleeds, congestion, facial swelling, rhinorrhea, neck pain, neck stiffness and ear discharge.  Eyes: Negative for pain, discharge, redness, itching and visual disturbance. Respiratory: Negative for cough, choking, chest tightness, shortness of breath, wheezing and stridor.  Cardiovascular: Negative for chest pain, palpitations and leg swelling. Gastrointestinal: Negative for abdominal distention. Genitourinary: Negative for dysuria, urgency, frequency, hematuria, flank pain, decreased urine volume, difficulty urinating and dyspareunia.  Hematological: Negative for adenopathy. Does not bruise/bleed easily. Psychiatric/Behavioral: Negative for hallucinations, behavioral problems, confusion, dysphoric mood, decreased concentration and agitation.    Objective:   Filed Vitals:   07/02/13 1732  BP: 138/97  Pulse: 67  Temp: 97.9 F (36.6 C)    Physical  Exam: Constitutional: Patient appears well-developed and well-nourished. No distress. HENT: Normocephalic, atraumatic, External right and left ear normal. Oropharynx is clear and moist.  Eyes: Conjunctivae and EOM are normal. PERRLA, no scleral icterus. Neck: Normal ROM. Neck supple. No JVD. No tracheal deviation. No thyromegaly. CVS: RRR, S1/S2 +, no murmurs, no gallops, no carotid bruit.  Pulmonary: Effort and breath sounds normal, no stridor, rhonchi, wheezes, rales.  Abdominal: Soft. BS +,  no distension, tenderness, rebound or guarding.  Musculoskeletal:  Normal range of motion. No edema and no tenderness.  Lymphadenopathy: No lymphadenopathy noted, cervical, inguinal or axillary Neuro: Alert. Normal reflexes, muscle tone coordination. No cranial nerve deficit. Skin: Skin is warm and dry. No rash noted. Not diaphoretic. No erythema. No pallor. Psychiatric: Normal mood and affect. Behavior, judgment, thought content normal.  Lab Results  Component Value Date   WBC 5.7 04/04/2013   HGB 13.8 04/04/2013   HCT 40.7 04/04/2013   MCV 94.2 04/04/2013   PLT 313 04/04/2013   Lab Results  Component Value Date   CREATININE 0.68 04/04/2013   BUN 13 04/04/2013   NA 134* 04/04/2013   K 4.3 04/04/2013   CL 104 04/04/2013   CO2 26 04/04/2013    Lab Results  Component Value Date   HGBA1C 5.0 02/22/2013   Lipid Panel     Component Value Date/Time   CHOL 154 04/04/2013 1022   TRIG 101 04/04/2013 1022   HDL 43 04/04/2013 1022   CHOLHDL 3.6 04/04/2013 1022   VLDL 20 04/04/2013 1022   LDLCALC 91 04/04/2013 1022       Assessment and plan:   Patient Active Problem List   Diagnosis Date Noted  . 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 Benign paroxysmal positional vertigo 07/02/2013  . Dysmenorrhea 08/17/2012  . Menorrhagia 08/17/2012  . DEPRESSION 07/21/2010  . HYPERTENSION, BENIGN ESSENTIAL 07/21/2010  . UNSPECIFIED VITAMIN D DEFICIENCY 01/05/2010  . OVERWEIGHT 04/29/2008  . ANEMIA 04/29/2008  . FATIGUE 04/29/2008  . PSORIASIS 04/04/2008  . IBS 03/10/2008  . Other specified disorders of bladder 03/10/2008  . VAGINITIS, BACTERIAL, RECURRENT 03/10/2008   Meclizine 25 mg tablet by mouth 3 times a day Refill all other medications Available lab results were reviewed with the patient  Katelyn Hardin was given clear instructions to go to ER or return to the clinic if symptoms don't improve, worsen or new problems develop.  Katelyn Hardin verbalized understanding.  Katelyn Hardin was told to call to get lab results if hasn't heard anything  in the next week.        Jeanann Lewandowsky, MD [facility] [place], [place] [phone]   07/02/2013, 6:11 PM

## 2013-07-04 ENCOUNTER — Telehealth: Payer: Self-pay

## 2013-07-04 NOTE — Telephone Encounter (Signed)
[facility] Per dr Hyman Hopes ok to change cream to aristocort

## 2013-07-05 ENCOUNTER — Encounter: Payer: Self-pay | Admitting: Internal Medicine

## 2013-07-09 ENCOUNTER — Ambulatory Visit (HOSPITAL_COMMUNITY): Payer: Self-pay

## 2013-07-10 ENCOUNTER — Ambulatory Visit: Payer: Self-pay | Attending: Internal Medicine

## 2013-08-14 ENCOUNTER — Telehealth: Payer: Self-pay | Admitting: Internal Medicine

## 2013-08-14 NOTE — Telephone Encounter (Signed)
Pt would like to know if she had a physical done at our office or [facility] (when we were at Urgent Care) because she needs a copy for her records/work.  Once she knows date of visit she will come in to sign a medical release form.  Please f/u with patient.

## 2013-08-15 IMAGING — US US TRANSVAGINAL NON-OB
1 series · 13 of 25 positions shown · non-contrast
Comparison: CT abdomen pelvis 07/17/2005

CLINICAL DATA: Left lower quadrant pain and an erosion.  Bilateral
tubal ligation.  LMP 08/05/2012.



[Series 1: us pelvis complete · 13 of 99 slices shown]
[im 1/99]
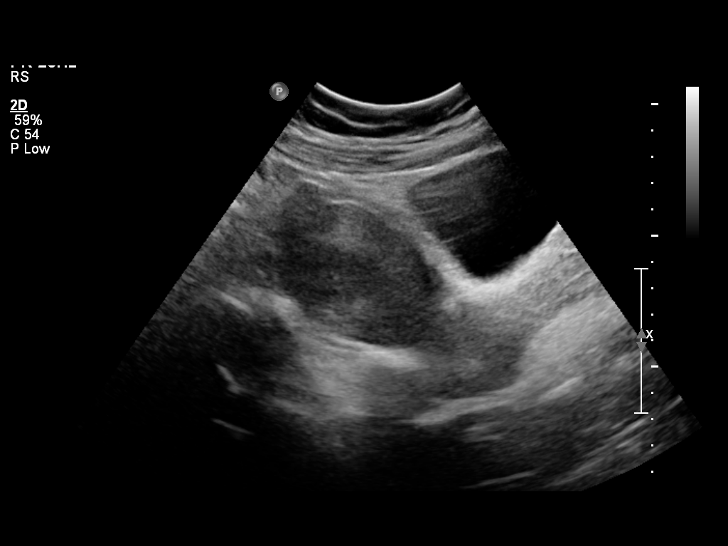
[im 9/99]
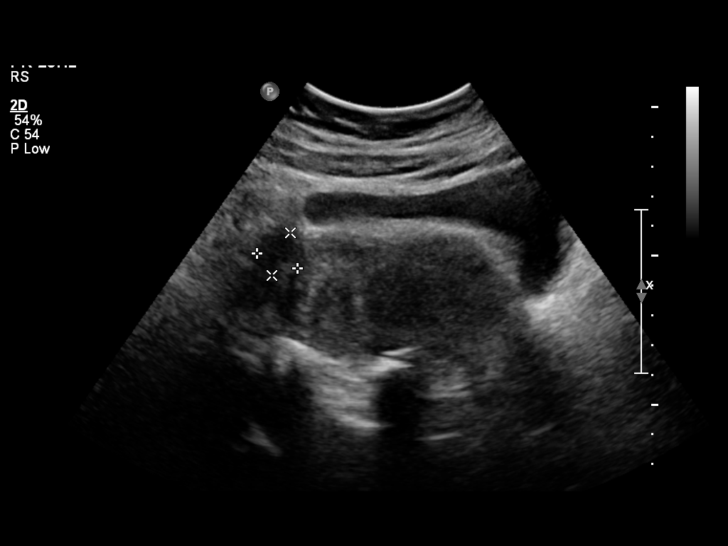
[im 17/99]
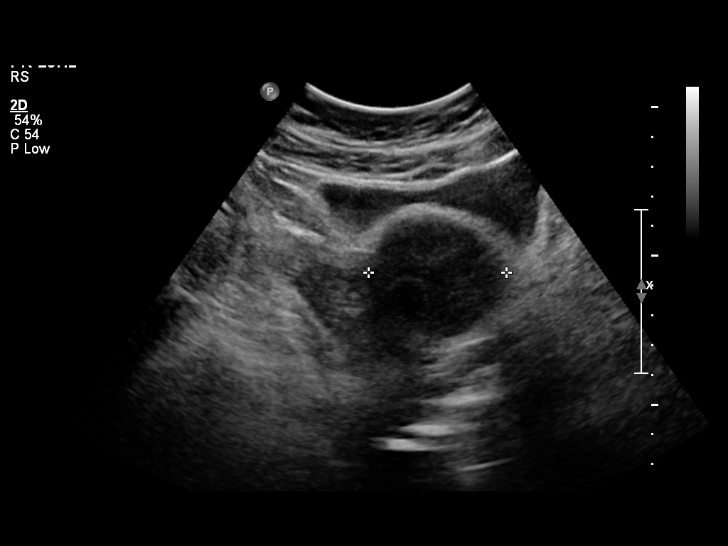
[im 25/99]
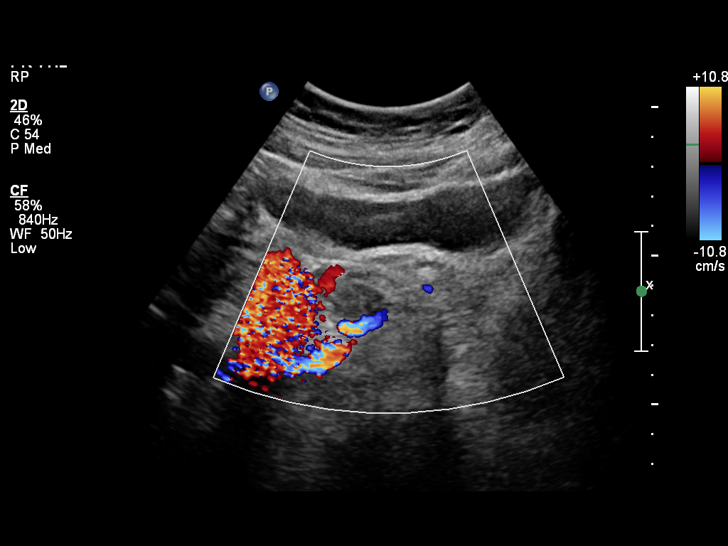
[im 33/99]
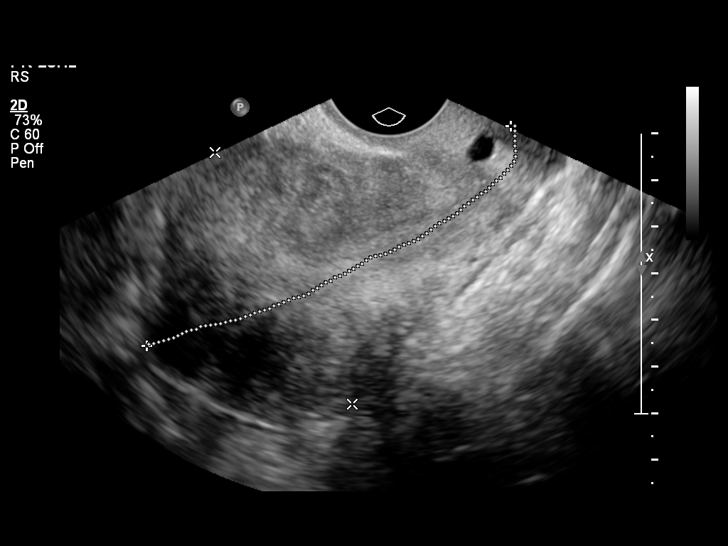
[im 41/99]
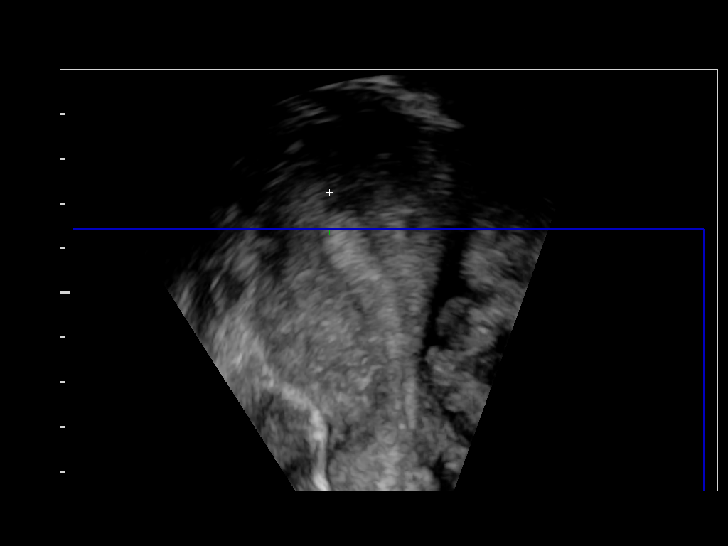
[im 50/99]
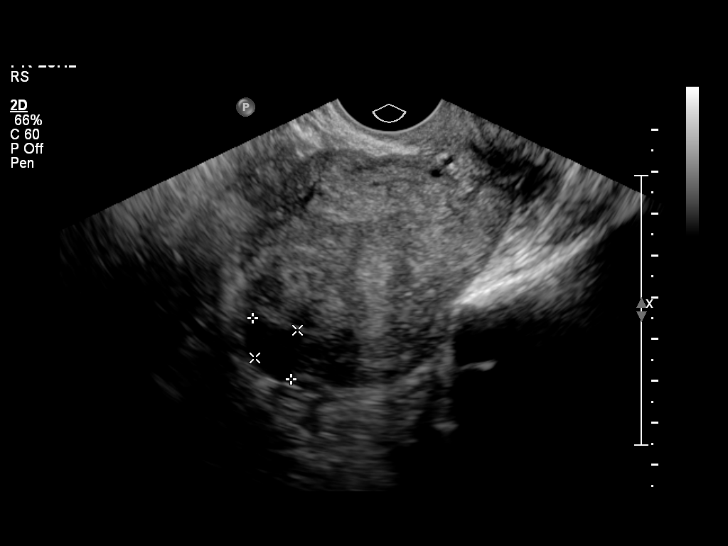
[im 58/99]
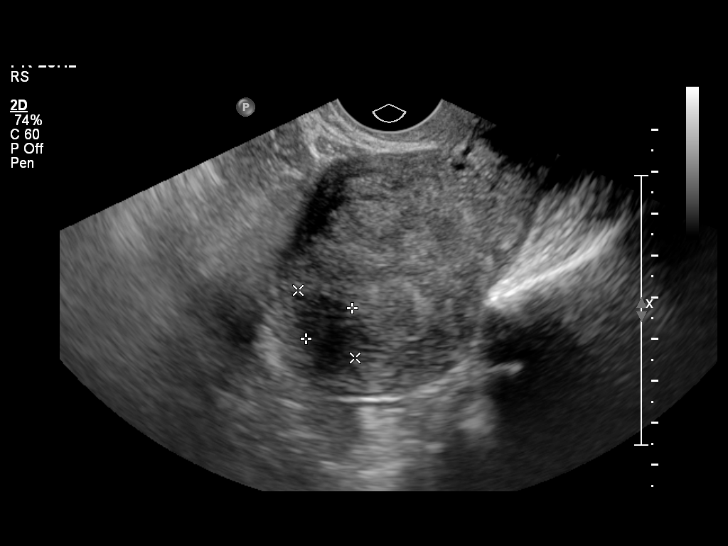
[im 66/99]
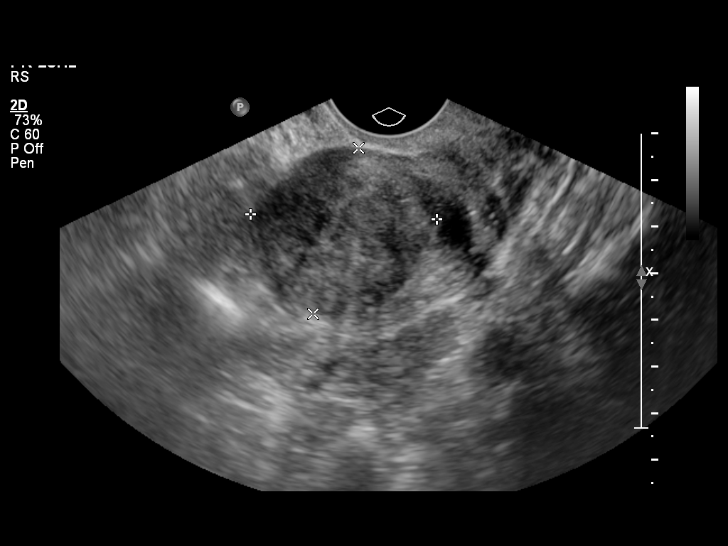
[im 74/99]
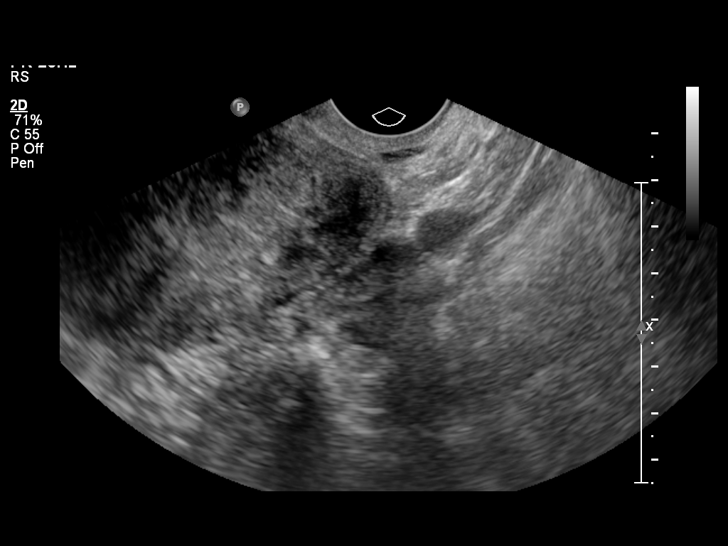
[im 82/99]
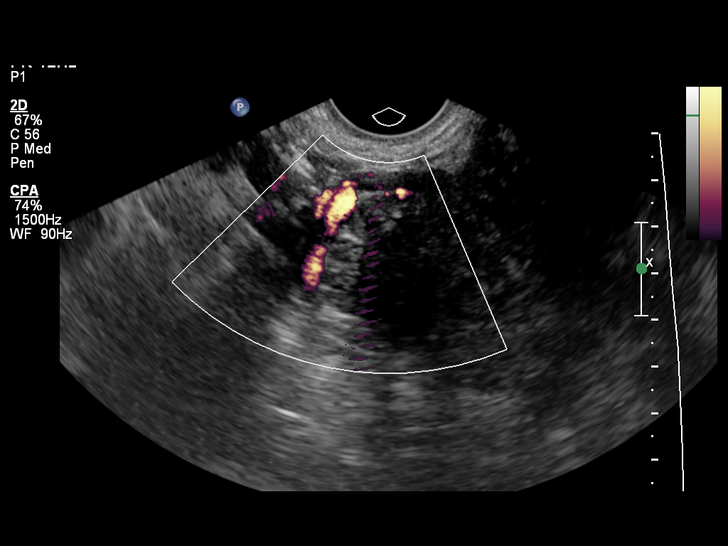
[im 90/99]
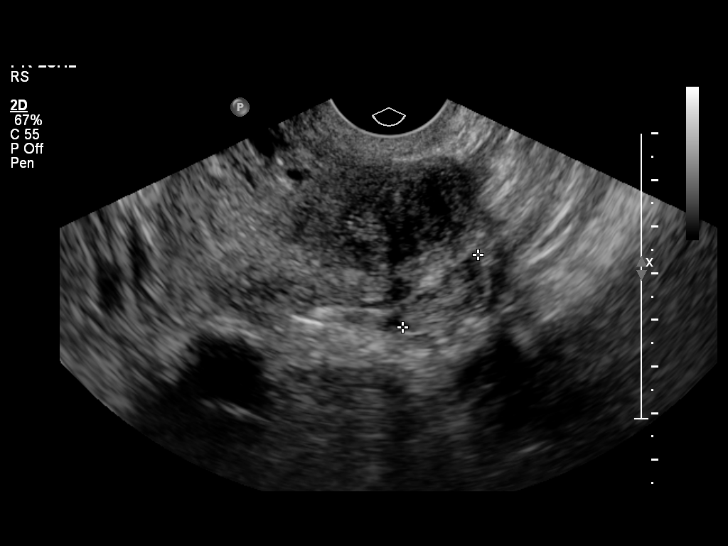
[im 99/99]
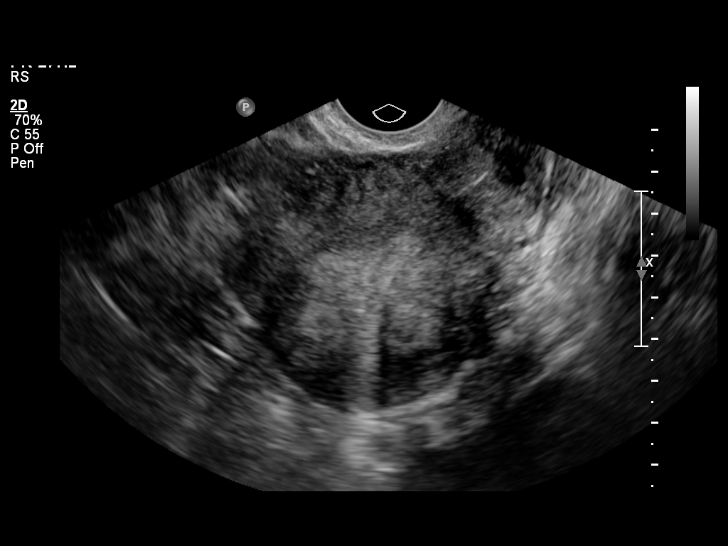

[13 of 25 positions shown; findings below may reference images not displayed]

FINDINGS: Uterus: The uterus measures 9.9 x 6.2 x 5.8 cm and contains several
rounded masses compatible with fibroids. The largest fibroid is
subserosal, extending from the left lower uterine segment,
measuring 4 x 3.7 x 4.8 cm.  An intramural fibroid in the left
uterine body measures 2.5 x 2.2 x 2.0 cm. A subserosal exophytic
fibroid from the right uterine fundus measures 1.5 x 1.5 x 1.7 cm.
Exophytic fundal fibroid measures 1.8 x 1.5 x 1.6 cm.

Endometrium: Measures 11.5 mm in thickness and is homogeneous.

Right ovary:  Right ovary is somewhat difficult to visualize but
appears normal.  Measures 2.3 x 1.6 x 2.6 cm.  No ovarian or
adnexal mass is identified.

Left ovary: Normal appearance/no adnexal mass.  Measures 2.9 x
x 2.2 cm.

Other findings: No free pelvic fluid is visualized.
IMPRESSION: 1.Uterine fibroids.  Five discrete fibroids are visualized, the
largest is exophytic fromthe left lower uterine segment, measuring
4.8 cm.
2. Normal ovaries.

## 2013-08-15 NOTE — Telephone Encounter (Signed)
Left a message with pt to call back

## 2013-09-02 ENCOUNTER — Ambulatory Visit: Payer: No Typology Code available for payment source | Attending: Internal Medicine | Admitting: Internal Medicine

## 2013-09-02 ENCOUNTER — Encounter: Payer: Self-pay | Admitting: Internal Medicine

## 2013-09-02 VITALS — BP 157/108 | HR 70 | Temp 97.9°F | Resp 16 | Ht 66.0 in | Wt 180.0 lb

## 2013-09-02 DIAGNOSIS — N951 Menopausal and female climacteric states: Secondary | ICD-10-CM | POA: Insufficient documentation

## 2013-09-02 DIAGNOSIS — G4700 Insomnia, unspecified: Secondary | ICD-10-CM

## 2013-09-02 DIAGNOSIS — M5430 Sciatica, unspecified side: Secondary | ICD-10-CM

## 2013-09-02 DIAGNOSIS — E559 Vitamin D deficiency, unspecified: Secondary | ICD-10-CM

## 2013-09-02 DIAGNOSIS — H8110 Benign paroxysmal vertigo, unspecified ear: Secondary | ICD-10-CM

## 2013-09-02 DIAGNOSIS — D259 Leiomyoma of uterus, unspecified: Secondary | ICD-10-CM

## 2013-09-02 DIAGNOSIS — I10 Essential (primary) hypertension: Secondary | ICD-10-CM

## 2013-09-02 DIAGNOSIS — D219 Benign neoplasm of connective and other soft tissue, unspecified: Secondary | ICD-10-CM

## 2013-09-02 MED ORDER — LISINOPRIL-HYDROCHLOROTHIAZIDE 20-25 MG PO TABS: 1.0000 | ORAL_TABLET | Freq: Every day | ORAL | Status: DC

## 2013-09-02 MED ORDER — MECLIZINE HCL 25 MG PO TABS: 25.0000 mg | ORAL_TABLET | Freq: Three times a day (TID) | ORAL | Status: DC | PRN

## 2013-09-02 MED ORDER — ZOLPIDEM TARTRATE 5 MG PO TABS: 5.0000 mg | ORAL_TABLET | Freq: Every evening | ORAL | Status: DC | PRN

## 2013-09-02 MED ORDER — TRAMADOL HCL 50 MG PO TABS: 50.0000 mg | ORAL_TABLET | Freq: Four times a day (QID) | ORAL | Status: DC | PRN

## 2013-09-02 MED ORDER — CYCLOBENZAPRINE HCL 5 MG PO TABS: 5.0000 mg | ORAL_TABLET | Freq: Three times a day (TID) | ORAL | Status: DC | PRN

## 2013-09-02 NOTE — Patient Instructions (Signed)
Sciatica °Sciatica is pain, weakness, numbness, or tingling along the path of the sciatic nerve. The nerve starts in the lower back and runs down the back of each leg. The nerve controls the muscles in the lower leg and in the back of the knee, while also providing sensation to the back of the thigh, lower leg, and the sole of your foot. Sciatica is a symptom of another medical condition. For instance, nerve damage or certain conditions, such as a herniated disk or bone spur on the spine, pinch or put pressure on the sciatic nerve. This causes the pain, weakness, or other sensations normally associated with sciatica. Generally, sciatica only affects one side of the body. °CAUSES  °· Herniated or slipped disc. °· Degenerative disk disease. °· A pain disorder involving the narrow muscle in the buttocks (piriformis syndrome). °· Pelvic injury or fracture. °· Pregnancy. °· Tumor (rare). °SYMPTOMS  °Symptoms can vary from mild to very severe. The symptoms usually travel from the low back to the buttocks and down the back of the leg. Symptoms can include: °· Mild tingling or dull aches in the lower back, leg, or hip. °· Numbness in the back of the calf or sole of the foot. °· Burning sensations in the lower back, leg, or hip. °· Sharp pains in the lower back, leg, or hip. °· Leg weakness. °· Severe back pain inhibiting movement. °These symptoms may get worse with coughing, sneezing, laughing, or prolonged sitting or standing. Also, being overweight may worsen symptoms. °DIAGNOSIS  °Your caregiver will perform a physical exam to look for common symptoms of sciatica. He or she may ask you to do certain movements or activities that would trigger sciatic nerve pain. Other tests may be performed to find the cause of the sciatica. These may include: °· Blood tests. °· X-rays. °· Imaging tests, such as an MRI or CT scan. °TREATMENT  °Treatment is directed at the cause of the sciatic pain. Sometimes, treatment is not necessary  and the pain and discomfort goes away on its own. If treatment is needed, your caregiver may suggest: °· Over-the-counter medicines to relieve pain. °· Prescription medicines, such as anti-inflammatory medicine, muscle relaxants, or narcotics. °· Applying heat or ice to the painful area. °· Steroid injections to lessen pain, irritation, and inflammation around the nerve. °· Reducing activity during periods of pain. °· Exercising and stretching to strengthen your abdomen and improve flexibility of your spine. Your caregiver may suggest losing weight if the extra weight makes the back pain worse. °· Physical therapy. °· Surgery to eliminate what is pressing or pinching the nerve, such as a bone spur or part of a herniated disk. °HOME CARE INSTRUCTIONS  °· Only take over-the-counter or prescription medicines for pain or discomfort as directed by your caregiver. °· Apply ice to the affected area for 20 minutes, 3 4 times a day for the first 48 72 hours. Then try heat in the same way. °· Exercise, stretch, or perform your usual activities if these do not aggravate your pain. °· Attend physical therapy sessions as directed by your caregiver. °· Keep all follow-up appointments as directed by your caregiver. °· Do not wear high heels or shoes that do not provide proper support. °· Check your mattress to see if it is too soft. A firm mattress may lessen your pain and discomfort. °SEEK IMMEDIATE MEDICAL CARE IF:  °· You lose control of your bowel or bladder (incontinence). °· You have increasing weakness in the lower back,   pelvis, buttocks, or legs. °· You have redness or swelling of your back. °· You have a burning sensation when you urinate. °· You have pain that gets worse when you lie down or awakens you at night. °· Your pain is worse than you have experienced in the past. °· Your pain is lasting longer than 4 weeks. °· You are suddenly losing weight without reason. °MAKE SURE YOU: °· Understand these  instructions. °· Will watch your condition. °· Will get help right away if you are not doing well or get worse. °Document Released: 11/01/2001 Document Revised: 05/08/2012 Document Reviewed: 03/18/2012 °[employer]® Patient Information ©2014 [employer]. ° °

## 2013-09-02 NOTE — Progress Notes (Signed)
Pt is here for a F/U visit. Pt is going through menopause and having severe hot flashes. Pt is having sleep depravation and very tired all day. Pt is requesting a sleep study.

## 2013-09-02 NOTE — Progress Notes (Signed)
Patient ID: Katelyn Hardin, female   DOB: 1971/11/12, 42 y.o.   MRN: [id number] Patient Demographics  Katelyn Hardin, is a 42 y.o. female  WUJ:[id number]  [id number]  DOB - Jun 07, 1971  Chief Complaint  Patient presents with  . Follow-up        Subjective:   Katelyn Hardin is a 42 y.o. female here today for a follow up visit. Her major complaint today is insomnia, hot flushes, restlessness, and she still having sciatica Pain. She also wants a referral to gynecologist for her uterine fibroid on menopausal symptoms. Patient has No headache, No chest pain, No abdominal pain - No Nausea, No new weakness tingling or numbness, No Cough - SOB.  ALLERGIES: Allergies  Allergen Reactions  . Acetaminophen Other (See Comments)    Bothers her  . Codeine     Upset stomach  . Oxycodone-Acetaminophen     Upset stomach  . Vicodin [Hydrocodone-Acetaminophen] Nausea Only    PAST MEDICAL HISTORY: Past Medical History  Diagnosis Date  . Bacterial vaginosis   . Hypertension   . Anemia   . Fibromyalgia     MEDICATIONS AT HOME: Prior to Admission medications   Medication Sig Start Date End Date Taking? Authorizing Provider  desonide (DESOWEN) 0.05 % cream Apply topically 2 (two) times daily. 07/02/13  Yes Jeanann Lewandowsky, MD  ferrous fumarate (HEMOCYTE - 106 MG FE) 325 (106 FE) MG TABS Take 1 tablet by mouth 2 (two) times daily.   Yes Historical Provider, MD  fluconazole (DIFLUCAN) 150 MG tablet Take 1 tablet (150 mg total) by mouth once. 06/21/13  Yes Adam Phenix, MD  gabapentin (NEURONTIN) 300 MG capsule Take 1 capsule (300 mg total) by mouth 3 (three) times daily. 07/02/13  Yes Jeanann Lewandowsky, MD  ibuprofen (ADVIL,MOTRIN) 600 MG tablet Take 1 tablet (600 mg total) by mouth every 6 (six) hours as needed for pain. 01/21/13  Yes Burgess Amor, PA-C  Multiple Vitamin (MULTIVITAMIN WITH MINERALS) TABS Take 1 tablet by mouth daily.   Yes Historical Provider, MD   sulfamethoxazole-trimethoprim (BACTRIM DS) 800-160 MG per tablet Take 1 tablet by mouth 2 (two) times daily. 06/10/13  Yes Adam Phenix, MD  traMADol (ULTRAM) 50 MG tablet Take 1 tablet (50 mg total) by mouth every 6 (six) hours as needed for pain. 09/02/13  Yes Jeanann Lewandowsky, MD  Vitamin D, Ergocalciferol, (DRISDOL) 50000 UNITS CAPS capsule Take 1 capsule (50,000 Units total) by mouth every 7 (seven) days. 07/02/13  Yes Jeanann Lewandowsky, MD  cyclobenzaprine (FLEXERIL) 5 MG tablet Take 1 tablet (5 mg total) by mouth 3 (three) times daily as needed for muscle spasms. 09/02/13   Jeanann Lewandowsky, MD  lisinopril-hydrochlorothiazide (PRINZIDE,ZESTORETIC) 20-25 MG per tablet Take 1 tablet by mouth daily. 09/02/13   Jeanann Lewandowsky, MD  meclizine (ANTIVERT) 25 MG tablet Take 1 tablet (25 mg total) by mouth 3 (three) times daily as needed for dizziness. 07/02/13   Jeanann Lewandowsky, MD  zolpidem (AMBIEN) 5 MG tablet Take 1 tablet (5 mg total) by mouth at bedtime as needed for sleep. 09/02/13   Jeanann Lewandowsky, MD     Objective:   Filed Vitals:   09/02/13 1718  BP: 157/108  Pulse: 70  Temp: 97.9 F (36.6 C)  TempSrc: Oral  Resp: 16  Height: 5\' 6"  (1.676 m)  Weight: 180 lb (81.647 kg)  SpO2: 97%    Exam General appearance : Awake, alert, not in any distress. Speech Clear. Not toxic looking HEENT: Atraumatic and Normocephalic, pupils  equally reactive to light and accomodation Neck: supple, no JVD. No cervical lymphadenopathy.  Chest:Good air entry bilaterally, no added sounds  CVS: S1 S2 regular, no murmurs.  Abdomen: Bowel sounds present, Non tender and not distended with no gaurding, rigidity or rebound. Extremities: B/L Lower Ext shows no edema, both legs are warm to touch Neurology: Awake alert, and oriented X 3, CN II-XII intact, Non focal Skin:No Rash Wounds:N/A   Data Review   CBC No results found for this basename: WBC, HGB, HCT, PLT, MCV, MCH, MCHC, RDW, NEUTRABS,  LYMPHSABS, MONOABS, EOSABS, BASOSABS, BANDABS, BANDSABD,  in the last 168 hours  Chemistries   No results found for this basename: NA, K, CL, CO2, GLUCOSE, BUN, CREATININE, GFRCGP, CALCIUM, MG, AST, ALT, ALKPHOS, BILITOT,  in the last 168 hours ------------------------------------------------------------------------------------------------------------------ No results found for this basename: HGBA1C,  in the last 72 hours ------------------------------------------------------------------------------------------------------------------ No results found for this basename: CHOL, HDL, LDLCALC, TRIG, CHOLHDL, LDLDIRECT,  in the last 72 hours ------------------------------------------------------------------------------------------------------------------ No results found for this basename: TSH, T4TOTAL, FREET3, T3FREE, THYROIDAB,  in the last 72 hours ------------------------------------------------------------------------------------------------------------------ No results found for this basename: VITAMINB12, FOLATE, FERRITIN, TIBC, IRON, RETICCTPCT,  in the last 72 hours  Coagulation profile  No results found for this basename: INR, PROTIME,  in the last 168 hours    Assessment & Plan   Patient Active Problem List   Diagnosis Date Noted  . 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 Insomnia 09/02/2013  . Fibroid 09/02/2013  . 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 Benign paroxysmal positional vertigo 07/02/2013  . Dysmenorrhea 08/17/2012  . Menorrhagia 08/17/2012  . DEPRESSION 07/21/2010  . HYPERTENSION, BENIGN ESSENTIAL 07/21/2010  . UNSPECIFIED VITAMIN D DEFICIENCY 01/05/2010  . OVERWEIGHT 04/29/2008  . ANEMIA 04/29/2008  . FATIGUE 04/29/2008  . PSORIASIS 04/04/2008  . IBS 03/10/2008  . Other specified disorders of bladder 03/10/2008  . VAGINITIS, BACTERIAL, RECURRENT 03/10/2008     Plan: Ambien 5 mg tablet by mouth each bedtime Replace hydrochlorothiazide with  lisinopril-hydrochlorothiazide 20-25 mg tablet by mouth daily Refill the other medications as follows: Meclizine 25 mg tablet by mouth 3 times a day Gabapentin 300 mg tablet by mouth 3 times a day Flexeril 5 mg tablet by mouth 3 times a day  Patient extensively counseled about her constellation of symptoms, mostly menopausal syndrome  Ambulatory referral to gynecologist for uterine fibroid and menopause  Patient has been counseled about nutrition and exercise   Follow up in 4 weeks for blood pressure check and routine followup    The patient was given clear instructions to go to ER or return to medical center if symptoms don't improve, worsen or new problems develop. The patient verbalized understanding. The patient was told to call to get lab results if they haven't heard anything in the next week.    Jeanann Lewandowsky, MD, MHA, FACP, FAAP [facility] [place], [place] [phone]   09/02/2013, 6:00 PM

## 2013-09-26 ENCOUNTER — Other Ambulatory Visit: Payer: Self-pay

## 2013-10-02 ENCOUNTER — Ambulatory Visit: Payer: No Typology Code available for payment source

## 2013-11-18 ENCOUNTER — Other Ambulatory Visit: Payer: Self-pay | Admitting: Internal Medicine

## 2013-11-18 DIAGNOSIS — M5430 Sciatica, unspecified side: Secondary | ICD-10-CM

## 2013-11-18 MED ORDER — CYCLOBENZAPRINE HCL 5 MG PO TABS: 5.0000 mg | ORAL_TABLET | Freq: Three times a day (TID) | ORAL | Status: DC | PRN

## 2013-12-18 ENCOUNTER — Encounter: Payer: Self-pay | Admitting: Obstetrics & Gynecology

## 2013-12-25 ENCOUNTER — Ambulatory Visit: Payer: Self-pay | Admitting: Internal Medicine

## 2013-12-25 ENCOUNTER — Ambulatory Visit: Payer: Self-pay

## 2014-01-14 ENCOUNTER — Encounter (HOSPITAL_COMMUNITY): Payer: Self-pay | Admitting: Emergency Medicine

## 2014-01-14 ENCOUNTER — Emergency Department (INDEPENDENT_AMBULATORY_CARE_PROVIDER_SITE_OTHER)
Admission: EM | Admit: 2014-01-14 | Discharge: 2014-01-14 | Disposition: A | Discharge status: Home / Self Care | Payer: Self-pay | Source: Home / Self Care | Attending: Family Medicine | Admitting: Family Medicine

## 2014-01-14 DIAGNOSIS — B309 Viral conjunctivitis, unspecified: Secondary | ICD-10-CM

## 2014-01-14 MED ORDER — POLYMYXIN B-TRIMETHOPRIM 10000-0.1 UNIT/ML-% OP SOLN: 1.0000 [drp] | OPHTHALMIC | Status: DC

## 2014-01-14 NOTE — ED Provider Notes (Signed)
Katelyn Hardin is a 43 y.o. female who presents to Urgent Care today for left eye conjunctival injection. Present since yesterday. 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 No fevers chills nausea vomiting or diarrhea. Patient has mild eye discharge. No pain or blurry vision. No fevers or chills nausea vomiting or diarrhea. No medications tried.   Past Medical History  Diagnosis Date  . Bacterial vaginosis   . Hypertension   . Anemia   . Fibromyalgia    History  Substance Use Topics  . Smoking status: Never Smoker   . Smokeless tobacco: Never Used  . Alcohol Use: 2.0 oz/week    4 drink(s) per week     Comment: 3x/wk   ROS as above Medications: No current facility-administered medications for this encounter.   Current Outpatient Prescriptions  Medication Sig Dispense Refill  . cyclobenzaprine (FLEXERIL) 5 MG tablet Take 1 tablet (5 mg total) by mouth 3 (three) times daily as needed for muscle spasms.  15 tablet  0  . desonide (DESOWEN) 0.05 % cream Apply topically 2 (two) times daily.  30 g  1  . ferrous fumarate (HEMOCYTE - 106 MG FE) 325 (106 FE) MG TABS Take 1 tablet by mouth 2 (two) times daily.      . fluconazole (DIFLUCAN) 150 MG tablet Take 1 tablet (150 mg total) by mouth once.  1 tablet  0  . gabapentin (NEURONTIN) 300 MG capsule Take 1 capsule (300 mg total) by mouth 3 (three) times daily.  90 capsule  3  . ibuprofen (ADVIL,MOTRIN) 600 MG tablet Take 1 tablet (600 mg total) by mouth every 6 (six) hours as needed for pain.  30 tablet  0  . lisinopril-hydrochlorothiazide (PRINZIDE,ZESTORETIC) 20-25 MG per tablet Take 1 tablet by mouth daily.  90 tablet  3  . meclizine (ANTIVERT) 25 MG tablet Take 1 tablet (25 mg total) by mouth 3 (three) times daily as needed for dizziness.  30 tablet  0  . Multiple Vitamin (MULTIVITAMIN WITH MINERALS) TABS Take 1 tablet by mouth daily.      Marland Kitchen sulfamethoxazole-trimethoprim (BACTRIM DS) 800-160 MG per tablet  Take 1 tablet by mouth 2 (two) times daily.  14 tablet  0  . traMADol (ULTRAM) 50 MG tablet Take 1 tablet (50 mg total) by mouth every 6 (six) hours as needed for pain.  30 tablet  0  . trimethoprim-polymyxin b (POLYTRIM) ophthalmic solution Place 1 drop into the right eye every 4 (four) hours.  10 mL  0  . Vitamin D, Ergocalciferol, (DRISDOL) 50000 UNITS CAPS capsule Take 1 capsule (50,000 Units total) by mouth every 7 (seven) days.  8 capsule  2  . zolpidem (AMBIEN) 5 MG tablet Take 1 tablet (5 mg total) by mouth at bedtime as needed for sleep.  30 tablet  1    Exam:  BP 124/72  Pulse 65  Temp(Src) 98.1 F (36.7 C) (Oral)  Resp 16  SpO2 100%  LMP 12/31/2013 Gen: Well NAD HEENT: EOMI,  MMM very minimal left eye conjunctival injection. PERRLA. Pain-free range of motion Lungs: Normal work of breathing. CTABL Heart: RRR no MRG Abd: NABS, Soft. NT, ND Exts: Brisk capillary refill, warm and well perfused.   No results found for this or any previous visit (from the past 24 hour(s)). No results found.  Assessment and Plan: 43 y.o. female with mild viral conjunctivitis. Plan to treat symptomatically with systaine atificial tears. Will have Polytrim antibiotic prescription for use if  not improving. Work note provided  Discussed warning signs or symptoms. Please see discharge instructions. Patient expresses understanding.    Gregor Hams, MD 01/14/14 [phone]

## 2014-01-14 NOTE — Discharge Instructions (Signed)
Thank you for coming in today. Use Systane artificial tears as needed If not getting better in several days fill and start taking Polytrim antibiotic eyedrops.  Return to work tomorrow or the next several days.   Viral Conjunctivitis Conjunctivitis is an irritation (inflammation) of the clear membrane that covers the white part of the eye (the conjunctiva). The irritation can also happen on the underside of the eyelids. Conjunctivitis makes the eye red or pink in color. This is what is commonly known as pink eye. Viral conjunctivitis can spread easily (contagious). CAUSES   Infection from virus on the surface of the eye.  Infection from the irritation or injury of nearby tissues such as the eyelids or cornea.  More serious inflammation or infection on the inside of the eye.  Other eye diseases.  The use of certain eye medications. SYMPTOMS  The normally white color of the eye or the underside of the eyelid is usually pink or red in color. The pink eye is usually associated with irritation, tearing and some sensitivity to light. Viral conjunctivitis is often associated with a clear, watery discharge. If a discharge is present, there may also be some blurred vision in the affected eye. DIAGNOSIS  Conjunctivitis is diagnosed by an eye exam. The eye specialist looks for changes in the surface tissues of the eye which take on changes characteristic of the specific types of conjunctivitis. A sample of any discharge may be collected on a Q-Tip (sterile swap). The sample will be sent to a lab to see whether or not the inflammation is caused by bacterial or viral infection. TREATMENT  Viral conjunctivitis will not respond to medicines that kill germs (antibiotics). Treatment is aimed at stopping a bacterial infection on top of the viral infection. The goal of treatment is to relieve symptoms (such as itching) with antihistamine drops or other eye medications.  HOME CARE INSTRUCTIONS   To ease  discomfort, apply a cool, clean wash cloth to your eye for 10 to 20 minutes, 3 to 4 times a day.  Gently wipe away any drainage from the eye with a warm, wet washcloth or a cotton ball.  Wash your hands often with soap and use paper towels to dry.  Do not share towels or washcloths. This may spread the infection.  Change or wash your pillowcase every day.  You should not use eye make-up until the infection is gone.  Stop using contacts lenses. Ask your eye professional how to sterilize or replace them before using again. This depends on the type of contact lenses used.  Do not touch the edge of the eyelid with the eye drop bottle or ointment tube when applying medications to the affected eye. This will stop you from spreading the infection to the other eye or to others. SEEK IMMEDIATE MEDICAL CARE IF:   The infection has not improved within 3 days of beginning treatment.  A watery discharge from the eye develops.  Pain in the eye increases.  The redness is spreading.  Vision becomes blurred.  An oral temperature above 102 F (38.9 C) develops, or as your caregiver suggests.  Facial pain, redness or swelling develops.  Any problems that may be related to the prescribed medicine develop. MAKE SURE YOU:   Understand these instructions.  Will watch your condition.  Will get help right away if you are not doing well or get worse. Document Released: 11/07/2005 Document Revised: 01/30/2012 Document Reviewed: 06/26/2008 [facility] Patient Information 2014 [place].

## 2014-01-14 NOTE — ED Notes (Signed)
C/o eye irritation  See physician note

## 2014-01-21 ENCOUNTER — Ambulatory Visit: Payer: Self-pay

## 2014-01-22 ENCOUNTER — Ambulatory Visit (INDEPENDENT_AMBULATORY_CARE_PROVIDER_SITE_OTHER): Payer: Self-pay | Admitting: Family Medicine

## 2014-01-22 ENCOUNTER — Encounter: Payer: Self-pay | Admitting: Family Medicine

## 2014-01-22 VITALS — BP 117/85 | HR 81 | Temp 97.9°F | Resp 20 | Ht 66.0 in | Wt 182.6 lb

## 2014-01-22 DIAGNOSIS — N920 Excessive and frequent menstruation with regular cycle: Secondary | ICD-10-CM

## 2014-01-22 DIAGNOSIS — N946 Dysmenorrhea, unspecified: Secondary | ICD-10-CM

## 2014-01-22 MED ORDER — NORGESTIM-ETH ESTRAD TRIPHASIC 0.18/0.215/0.25 MG-25 MCG PO TABS: 1.0000 | ORAL_TABLET | Freq: Every day | ORAL | Status: DC

## 2014-01-22 NOTE — Progress Notes (Signed)
Pt states she is unsure why she is here as she has been seeing Dr. Roselie Awkward for all her visits and says she didn't have a follow up because she didn't start the OCPs so she was waiting to start them.  States this appointment was made for her and she doesn't need it.  Given this, I refilled her rx as her other had expired and will have her f/u in 3 months with Dr. Roselie Awkward.   No visit charged today.

## 2014-01-22 NOTE — Progress Notes (Signed)
Pt sent from [facility] for evaluation due to irregular, painful periods. The periods are also very heavy.

## 2014-01-27 ENCOUNTER — Ambulatory Visit: Payer: Self-pay | Admitting: Internal Medicine

## 2014-01-27 ENCOUNTER — Ambulatory Visit: Payer: Self-pay | Attending: Internal Medicine

## 2014-02-07 ENCOUNTER — Ambulatory Visit: Payer: No Typology Code available for payment source | Attending: Internal Medicine

## 2014-02-07 ENCOUNTER — Ambulatory Visit (HOSPITAL_BASED_OUTPATIENT_CLINIC_OR_DEPARTMENT_OTHER): Payer: No Typology Code available for payment source | Admitting: Internal Medicine

## 2014-02-07 VITALS — BP 114/77 | HR 82 | Temp 98.2°F | Ht 66.0 in | Wt 182.6 lb

## 2014-02-07 DIAGNOSIS — I10 Essential (primary) hypertension: Secondary | ICD-10-CM

## 2014-02-07 DIAGNOSIS — H8110 Benign paroxysmal vertigo, unspecified ear: Secondary | ICD-10-CM

## 2014-02-07 DIAGNOSIS — E559 Vitamin D deficiency, unspecified: Secondary | ICD-10-CM

## 2014-02-07 LAB — COMPLETE METABOLIC PANEL WITH GFR
ALBUMIN: 4.2 g/dL (ref 3.5–5.2)
ALK PHOS: 66 U/L (ref 39–117)
ALT: 19 U/L (ref 0–35)
AST: 19 U/L (ref 0–37)
BILIRUBIN TOTAL: 0.3 mg/dL (ref 0.2–1.2)
BUN: 11 mg/dL (ref 6–23)
CO2: 28 mEq/L (ref 19–32)
Calcium: 11.6 mg/dL — ABNORMAL HIGH (ref 8.4–10.5)
Chloride: 105 mEq/L (ref 96–112)
Creat: 0.73 mg/dL (ref 0.50–1.10)
GFR, Est African American: >89 mL/min
GFR, Est Non African American: >89 mL/min
Glucose, Bld: 88 mg/dL (ref 70–99)
Potassium: 3.7 mEq/L (ref 3.5–5.3)
SODIUM: 138 meq/L (ref 135–145)
TOTAL PROTEIN: 7.5 g/dL (ref 6.0–8.3)

## 2014-02-07 LAB — CBC WITH DIFFERENTIAL/PLATELET
BASOS ABS: 0 10*3/uL (ref 0.0–0.1)
BASOS PCT: 0 % (ref 0–1)
Eosinophils Absolute: 0.1 10*3/uL (ref 0.0–0.7)
Eosinophils Relative: 1 % (ref 0–5)
HCT: 35.7 % — ABNORMAL LOW (ref 36.0–46.0)
Hemoglobin: 11.9 g/dL — ABNORMAL LOW (ref 12.0–15.0)
Lymphocytes Relative: 40 % (ref 12–46)
Lymphs Abs: 2.2 10*3/uL (ref 0.7–4.0)
MCH: 31.2 pg (ref 26.0–34.0)
MCHC: 33.3 g/dL (ref 30.0–36.0)
MCV: 93.7 fL (ref 78.0–100.0)
Monocytes Absolute: 0.5 10*3/uL (ref 0.1–1.0)
Monocytes Relative: 9 % (ref 3–12)
NEUTROS PCT: 50 % (ref 43–77)
Neutro Abs: 2.8 10*3/uL (ref 1.7–7.7)
Platelets: 361 10*3/uL (ref 150–400)
RBC: 3.81 MIL/uL — ABNORMAL LOW (ref 3.87–5.11)
RDW: 13.8 % (ref 11.5–15.5)
WBC: 5.6 10*3/uL (ref 4.0–10.5)

## 2014-02-07 LAB — TSH: TSH: 0.639 u[IU]/mL (ref 0.350–4.500)

## 2014-02-07 MED ORDER — GABAPENTIN 300 MG PO CAPS: 300.0000 mg | ORAL_CAPSULE | Freq: Three times a day (TID) | ORAL | Status: DC

## 2014-02-07 MED ORDER — CYCLOBENZAPRINE HCL 5 MG PO TABS: 5.0000 mg | ORAL_TABLET | Freq: Three times a day (TID) | ORAL | Status: DC | PRN

## 2014-02-07 MED ORDER — LISINOPRIL-HYDROCHLOROTHIAZIDE 20-25 MG PO TABS: 1.0000 | ORAL_TABLET | Freq: Every day | ORAL | Status: DC

## 2014-02-07 MED ORDER — MECLIZINE HCL 25 MG PO TABS: 25.0000 mg | ORAL_TABLET | Freq: Three times a day (TID) | ORAL | Status: DC | PRN

## 2014-02-07 MED ORDER — TRAMADOL HCL 50 MG PO TABS: 50.0000 mg | ORAL_TABLET | Freq: Four times a day (QID) | ORAL | Status: DC | PRN

## 2014-02-07 MED ORDER — PANTOPRAZOLE SODIUM 40 MG PO TBEC: 40.0000 mg | DELAYED_RELEASE_TABLET | Freq: Every day | ORAL | Status: DC

## 2014-02-07 NOTE — Progress Notes (Signed)
Patient ID: Katelyn Hardin, female   DOB: 02/28/1971, 43 y.o.   MRN: [id number]   CC:  HPI: Patient here for followup. Requesting refill on tramadol, Flexeril, meclizine. Complaining of feeling hungry all day and eating all day, possibly to prescribe her something to suppress her appetite. She admits to taking Aleve and ibuprofen over-the-counter for pain Blood Pressure well controlled today   Allergies  Allergen Reactions  . Acetaminophen Other (See Comments)    Bothers her  . Codeine     Upset stomach  . Oxycodone-Acetaminophen     Upset stomach  . Vicodin [Hydrocodone-Acetaminophen] Nausea Only   Past Medical History  Diagnosis Date  . Bacterial vaginosis   . Hypertension   . Anemia   . Fibromyalgia    Current Outpatient Prescriptions on File Prior to Visit  Medication Sig Dispense Refill  . cyclobenzaprine (FLEXERIL) 5 MG tablet Take 1 tablet (5 mg total) by mouth 3 (three) times daily as needed for muscle spasms.  15 tablet  0  . desonide (DESOWEN) 0.05 % cream Apply topically 2 (two) times daily.  30 g  1  . ferrous fumarate (HEMOCYTE - 106 MG FE) 325 (106 FE) MG TABS Take 1 tablet by mouth 2 (two) times daily.      . Multiple Vitamin (MULTIVITAMIN WITH MINERALS) TABS Take 1 tablet by mouth daily.      . Norgestimate-Ethinyl Estradiol Triphasic 0.18/0.215/0.25 MG-25 MCG tab Take 1 tablet by mouth daily.  1 Package  3  . trimethoprim-polymyxin b (POLYTRIM) ophthalmic solution Place 1 drop into the right eye every 4 (four) hours.  10 mL  0  . Vitamin D, Ergocalciferol, (DRISDOL) 50000 UNITS CAPS capsule Take 1 capsule (50,000 Units total) by mouth every 7 (seven) days.  8 capsule  2  . zolpidem (AMBIEN) 5 MG tablet Take 1 tablet (5 mg total) by mouth at bedtime as needed for sleep.  30 tablet  1   No current facility-administered medications on file prior to visit.   Family History  Problem Relation Age of Onset  . Thyroid disease Sister   . Cancer Maternal Aunt      breast   History   Social History  . Marital Status: Married    Spouse Name: N/A    Number of Children: N/A  . Years of Education: N/A   Occupational History  . Not on file.   Social History Main Topics  . Smoking status: Never Smoker   . Smokeless tobacco: Never Used  . Alcohol Use: 2.0 oz/week    4 drink(s) per week     Comment: 3x/wk  . Drug Use: No  . Sexual Activity: Yes    Birth Control/ Protection: Surgical, Condom   Other Topics Concern  . Not on file   Social History Narrative  . No narrative on file    Review of Systems  Constitutional: Negative for fever, chills, diaphoresis, activity change, appetite change and fatigue.  HENT: Negative for ear pain, nosebleeds, congestion, facial swelling, rhinorrhea, neck pain, neck stiffness and ear discharge.   Eyes: Negative for pain, discharge, redness, itching and visual disturbance.  Respiratory: Negative for cough, choking, chest tightness, shortness of breath, wheezing and stridor.   Cardiovascular: Negative for chest pain, palpitations and leg swelling.  Gastrointestinal: Negative for abdominal distention.  Genitourinary: Negative for dysuria, urgency, frequency, hematuria, flank pain, decreased urine volume, difficulty urinating and dyspareunia.  Musculoskeletal: Negative for back pain, joint swelling, arthralgias and gait problem.  Neurological: Negative  for dizziness, tremors, seizures, syncope, facial asymmetry, speech difficulty, weakness, light-headedness, numbness and headaches.  Hematological: Negative for adenopathy. Does not bruise/bleed easily.  Psychiatric/Behavioral: Negative for hallucinations, behavioral problems, confusion, dysphoric mood, decreased concentration and agitation.    Objective:   Filed Vitals:   02/07/14 1613  BP: 114/77  Pulse: 82  Temp: 98.2 F (36.8 C)    Physical Exam  Constitutional: Appears well-developed and well-nourished. No distress.  HENT: Normocephalic.  External right and left ear normal. Oropharynx is clear and moist.  Eyes: Conjunctivae and EOM are normal. PERRLA, no scleral icterus.  Neck: Normal ROM. Neck supple. No JVD. No tracheal deviation. No thyromegaly.  CVS: RRR, S1/S2 +, no murmurs, no gallops, no carotid bruit.  Pulmonary: Effort and breath sounds normal, no stridor, rhonchi, wheezes, rales.  Abdominal: Soft. BS +,  no distension, tenderness, rebound or guarding.  Musculoskeletal: Normal range of motion. No edema and no tenderness.  Lymphadenopathy: No lymphadenopathy noted, cervical, inguinal. Neuro: Alert. Normal reflexes, muscle tone coordination. No cranial nerve deficit. Skin: Skin is warm and dry. No rash noted. Not diaphoretic. No erythema. No pallor.  Psychiatric: Normal mood and affect. Behavior, judgment, thought content normal.   Lab Results  Component Value Date   WBC 5.7 04/04/2013   HGB 13.8 04/04/2013   HCT 40.7 04/04/2013   MCV 94.2 04/04/2013   PLT 313 04/04/2013   Lab Results  Component Value Date   CREATININE 0.68 04/04/2013   BUN 13 04/04/2013   NA 134* 04/04/2013   K 4.3 04/04/2013   CL 104 04/04/2013   CO2 26 04/04/2013    Lab Results  Component Value Date   HGBA1C 5.0 02/22/2013   Lipid Panel     Component Value Date/Time   CHOL 154 04/04/2013 1022   TRIG 101 04/04/2013 1022   HDL 43 04/04/2013 1022   CHOLHDL 3.6 04/04/2013 1022   VLDL 20 04/04/2013 1022   LDLCALC 91 04/04/2013 1022       Assessment and plan:   Patient Active Problem List   Diagnosis Date Noted  . 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 Insomnia 09/02/2013  . Fibroid 09/02/2013  . 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 Benign paroxysmal positional vertigo 07/02/2013  . Dysmenorrhea 08/17/2012  . Menorrhagia 08/17/2012  . DEPRESSION 07/21/2010  . HYPERTENSION, BENIGN ESSENTIAL 07/21/2010  . UNSPECIFIED VITAMIN D DEFICIENCY 01/05/2010  . OVERWEIGHT 04/29/2008  . ANEMIA 04/29/2008  . FATIGUE 04/29/2008  . PSORIASIS 04/04/2008  . IBS  03/10/2008  . Other specified disorders of bladder 03/10/2008  . 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 The patient verbalized understanding. The patient was told to call to get any lab results if not heard anything in the next week.

## 2014-02-07 NOTE — Progress Notes (Signed)
Patient is here today for a follow up 6 month visit. Patient has been trying to diet and her appetite keeps increasing, wanting to know if there is anything else she can be put on for that

## 2014-02-13 ENCOUNTER — Ambulatory Visit: Payer: No Typology Code available for payment source | Attending: Internal Medicine

## 2014-02-17 ENCOUNTER — Telehealth: Payer: Self-pay | Admitting: Emergency Medicine

## 2014-02-17 ENCOUNTER — Other Ambulatory Visit: Payer: Self-pay

## 2014-02-17 NOTE — Telephone Encounter (Signed)
Pt given lab results with instructions

## 2014-02-17 NOTE — Telephone Encounter (Signed)
Message copied by Ricci Barker on Mon Feb 17, 2014  5:57 PM ------      Message from: Allyson Sabal MD, Ascencion Dike      Created: Wed Feb 12, 2014 11:19 AM       Notify patient that labs are normal. Hemoglobin is slightly low at 11.9. The patient can start taking a multivitamin for this ------

## 2014-02-18 ENCOUNTER — Ambulatory Visit: Payer: No Typology Code available for payment source | Attending: Internal Medicine

## 2014-02-18 DIAGNOSIS — Z0000 Encounter for general adult medical examination without abnormal findings: Secondary | ICD-10-CM

## 2014-02-18 NOTE — Progress Notes (Unsigned)
Pt is here for ppd test.

## 2014-02-18 NOTE — Patient Instructions (Signed)
Pt is to return in 2 days to read the final results of the ppd test.

## 2014-02-20 ENCOUNTER — Ambulatory Visit: Payer: Self-pay

## 2014-02-20 LAB — TB SKIN TEST
INDURATION: 0 mm
TB Skin Test: NEGATIVE

## 2014-03-17 ENCOUNTER — Other Ambulatory Visit: Payer: Self-pay | Admitting: Internal Medicine

## 2014-03-17 ENCOUNTER — Telehealth: Payer: Self-pay | Admitting: Internal Medicine

## 2014-03-17 ENCOUNTER — Telehealth: Payer: Self-pay

## 2014-03-17 NOTE — Telephone Encounter (Signed)
Patient had left voice mail that she thinks she has a UTI and  Would like [place] to call in a prescription for here Tried to return patients call Patient not available Left voice mail to return our call

## 2014-03-17 NOTE — Telephone Encounter (Signed)
Pt says she needs a UTI med called in, says she does not want to come for appt/walk-in. Please f/u with pt.

## 2014-03-19 ENCOUNTER — Ambulatory Visit: Payer: No Typology Code available for payment source | Attending: Family Medicine | Admitting: Family Medicine

## 2014-03-19 ENCOUNTER — Encounter: Payer: Self-pay | Admitting: Family Medicine

## 2014-03-19 VITALS — BP 123/87 | HR 83 | Temp 98.9°F | Resp 16 | Ht 66.0 in | Wt 185.0 lb

## 2014-03-19 DIAGNOSIS — N390 Urinary tract infection, site not specified: Secondary | ICD-10-CM | POA: Insufficient documentation

## 2014-03-19 DIAGNOSIS — Z87440 Personal history of urinary (tract) infections: Secondary | ICD-10-CM | POA: Insufficient documentation

## 2014-03-19 LAB — POCT URINALYSIS DIPSTICK
Bilirubin, UA: NEGATIVE
Glucose, UA: NEGATIVE
KETONES UA: NEGATIVE
Nitrite, UA: POSITIVE
Protein, UA: NEGATIVE
Spec Grav, UA: >=1.03
Urobilinogen, UA: 1
pH, UA: 5.5

## 2014-03-19 MED ORDER — CEPHALEXIN 500 MG PO CAPS: 500.0000 mg | ORAL_CAPSULE | Freq: Three times a day (TID) | ORAL | Status: DC

## 2014-03-19 NOTE — Progress Notes (Signed)
   Subjective:    Patient ID: Katelyn Hardin, female    DOB: February 19, 1971, 43 y.o.   MRN: [id number]  HPI DYSURIA  Pain with urination Medications tried: No Any antibiotics in the last 30 days: No More than 3 UTIs in the last 12 months: No UTI this year but she had one last year. STD exposure: No Possibly pregnant: No  Symptoms Urgency: yes Frequency: more frequent than usual but less urine  Blood in urine:No Pain in back:Yes Fever: No Vaginal discharge: No Mouth Ulcers: No2  Patient believes could be caused by: [place] pop   Review of Symptoms - see HPI PMH - Smoking status noted.       Review of Systems     Objective:   Physical Exam No CVA tenderness. Alert no acute distress Abdomen: soft and non-tender without masses, organomegaly or hernias noted.  No guarding or rebound  UA noted         Assessment & Plan:   UTI - uncomplicated.  Treatment with keflex

## 2014-03-19 NOTE — Patient Instructions (Signed)
Pt is aware that she has an UTI We prescribed an antibiotic "Keflex." Make sure to take every dose and complete taking the medication. If symptoms do not improve you are to return to the clinic next week. Any high fever or vomiting Please go to the E.R.

## 2014-05-05 ENCOUNTER — Ambulatory Visit: Payer: No Typology Code available for payment source | Admitting: Internal Medicine

## 2014-05-28 ENCOUNTER — Telehealth: Payer: Self-pay | Admitting: *Deleted

## 2014-05-28 ENCOUNTER — Ambulatory Visit: Payer: No Typology Code available for payment source | Admitting: Obstetrics and Gynecology

## 2014-05-28 NOTE — Telephone Encounter (Signed)
Katelyn Hardin missed his scheduled appointment for possible BV- called her and she states she forgot because she was busy. She does want it rescheduled, but needs it to be as late as possible  In the day- like 4pm- informed her I would have front office to reschedule and call her.

## 2014-06-08 ENCOUNTER — Emergency Department (HOSPITAL_COMMUNITY)
Admission: EM | Admit: 2014-06-08 | Discharge: 2014-06-08 | Disposition: A | Discharge status: Home / Self Care | Payer: No Typology Code available for payment source | Source: Home / Self Care | Attending: Family Medicine | Admitting: Family Medicine

## 2014-06-08 ENCOUNTER — Encounter (HOSPITAL_COMMUNITY): Payer: Self-pay | Admitting: Emergency Medicine

## 2014-06-08 ENCOUNTER — Emergency Department (INDEPENDENT_AMBULATORY_CARE_PROVIDER_SITE_OTHER): Payer: No Typology Code available for payment source

## 2014-06-08 DIAGNOSIS — J189 Pneumonia, unspecified organism: Secondary | ICD-10-CM

## 2014-06-08 DIAGNOSIS — H8110 Benign paroxysmal vertigo, unspecified ear: Secondary | ICD-10-CM

## 2014-06-08 DIAGNOSIS — I10 Essential (primary) hypertension: Secondary | ICD-10-CM

## 2014-06-08 DIAGNOSIS — N951 Menopausal and female climacteric states: Secondary | ICD-10-CM

## 2014-06-08 DIAGNOSIS — E559 Vitamin D deficiency, unspecified: Secondary | ICD-10-CM

## 2014-06-08 DIAGNOSIS — R82998 Other abnormal findings in urine: Secondary | ICD-10-CM

## 2014-06-08 DIAGNOSIS — M5430 Sciatica, unspecified side: Secondary | ICD-10-CM

## 2014-06-08 HISTORY — DX: Benign neoplasm of connective and other soft tissue, unspecified: D21.9

## 2014-06-08 LAB — COMPREHENSIVE METABOLIC PANEL
ALT: 80 U/L — AB (ref 0–35)
AST: 56 U/L — ABNORMAL HIGH (ref 0–37)
Albumin: 3.7 g/dL (ref 3.5–5.2)
Alkaline Phosphatase: 111 U/L (ref 39–117)
Anion gap: 14 (ref 5–15)
BUN: 7 mg/dL (ref 6–23)
CO2: 25 mEq/L (ref 19–32)
Calcium: 11 mg/dL — ABNORMAL HIGH (ref 8.4–10.5)
Chloride: 98 mEq/L (ref 96–112)
Creatinine, Ser: 0.72 mg/dL (ref 0.50–1.10)
GFR calc Af Amer: >90 mL/min (ref >90)
GFR calc non Af Amer: >90 mL/min (ref >90)
GLUCOSE: 102 mg/dL — AB (ref 70–99)
Potassium: 3.7 mEq/L (ref 3.7–5.3)
SODIUM: 137 meq/L (ref 137–147)
TOTAL PROTEIN: 8.7 g/dL — AB (ref 6.0–8.3)
Total Bilirubin: 0.8 mg/dL (ref 0.3–1.2)

## 2014-06-08 LAB — CBC
HCT: 37.9 % (ref 36.0–46.0)
HEMOGLOBIN: 12.7 g/dL (ref 12.0–15.0)
MCH: 31.8 pg (ref 26.0–34.0)
MCHC: 33.5 g/dL (ref 30.0–36.0)
MCV: 95 fL (ref 78.0–100.0)
PLATELETS: 286 10*3/uL (ref 150–400)
RBC: 3.99 MIL/uL (ref 3.87–5.11)
RDW: 14.1 % (ref 11.5–15.5)
WBC: 8.2 10*3/uL (ref 4.0–10.5)

## 2014-06-08 LAB — POCT URINALYSIS DIP (DEVICE)
Glucose, UA: NEGATIVE mg/dL
Hgb urine dipstick: NEGATIVE
Ketones, ur: NEGATIVE mg/dL
Leukocytes, UA: NEGATIVE
NITRITE: NEGATIVE
PH: 8.5 — AB (ref 5.0–8.0)
PROTEIN: 30 mg/dL — AB
Specific Gravity, Urine: 1.015 (ref 1.005–1.030)
Urobilinogen, UA: >=8 mg/dL (ref 0.0–1.0)

## 2014-06-08 LAB — LIPASE, BLOOD: Lipase: 16 U/L (ref 11–59)

## 2014-06-08 LAB — POCT PREGNANCY, URINE: Preg Test, Ur: NEGATIVE

## 2014-06-08 MED ORDER — ONDANSETRON 4 MG PO TBDP
ORAL_TABLET | ORAL | Status: AC
  Filled 2014-06-08: qty 1

## 2014-06-08 MED ORDER — IBUPROFEN 800 MG PO TABS
800.0000 mg | ORAL_TABLET | Freq: Once | ORAL | Status: AC
  Administered 2014-06-08: 800 mg via ORAL

## 2014-06-08 MED ORDER — IBUPROFEN 800 MG PO TABS
ORAL_TABLET | ORAL | Status: AC
  Filled 2014-06-08: qty 1

## 2014-06-08 MED ORDER — AZITHROMYCIN 250 MG PO TABS: 250.0000 mg | ORAL_TABLET | Freq: Every day | ORAL | Status: DC

## 2014-06-08 MED ORDER — ONDANSETRON 4 MG PO TBDP
4.0000 mg | ORAL_TABLET | Freq: Once | ORAL | Status: AC
  Administered 2014-06-08: 4 mg via ORAL

## 2014-06-08 NOTE — ED Provider Notes (Signed)
CSN: [id number]     Arrival date & time 06/08/14  4431 History   First MD Initiated Contact with Patient 06/08/14 0919     Chief Complaint  Patient presents with  . Fever   (Consider location/radiation/quality/duration/timing/severity/associated sxs/prior Treatment) HPI Comments: No tick bites No rash No GU sx Works in childcare Nonsmoker PCP: [facility]  Patient is a 43 y.o. female presenting with fever.  Fever Temp source:  Subjective Severity:  Moderate Onset quality:  Gradual Duration:  3 days Timing:  Constant Progression:  Worsening Chronicity:  New Relieved by:  Ibuprofen Associated symptoms: chills, headaches, myalgias and nausea   Associated symptoms: no chest pain, no confusion, no congestion, no cough, no diarrhea, no dysuria, no ear pain, no rash, no rhinorrhea, no somnolence, no sore throat and no vomiting   Risk factors: no immunosuppression, no occupational exposure, no recent travel and no sick contacts     Past Medical History  Diagnosis Date  . Bacterial vaginosis   . Hypertension   . Anemia   . Fibromyalgia   . Fibroids    Past Surgical History  Procedure Laterality Date  . Tubal ligation    . Tubal ligation     Family History  Problem Relation Age of Onset  . Thyroid disease Sister   . Cancer Maternal Aunt     breast   History  Substance Use Topics  . Smoking status: Never Smoker   . Smokeless tobacco: Never Used  . Alcohol Use: 2.0 oz/week    4 drink(s) per week     Comment: 3x/wk   OB History   Grav Para Term Preterm Abortions TAB SAB Ect Mult Living   3 2 2  1  0 1 0 0 2     Review of Systems  Constitutional: Positive for fever and chills.  HENT: Negative for congestion, ear pain, rhinorrhea and sore throat.   Respiratory: Negative for cough.   Cardiovascular: Negative for chest pain.  Gastrointestinal: Positive for nausea. Negative for vomiting and diarrhea.  Genitourinary: Negative for dysuria.  Musculoskeletal: Positive for  myalgias.  Skin: Negative for rash.  Neurological: Positive for headaches.  Psychiatric/Behavioral: Negative for confusion.  All other systems reviewed and are negative.   Allergies  Acetaminophen; Codeine; Oxycodone-acetaminophen; and Vicodin  Home Medications   Prior to Admission medications   Medication Sig Start Date End Date Taking? Authorizing Provider  desonide (DESOWEN) 0.05 % cream Apply topically 2 (two) times daily. 07/02/13  Yes Angelica Chessman, MD  gabapentin (NEURONTIN) 300 MG capsule Take 1 capsule (300 mg total) by mouth 3 (three) times daily. 02/07/14  Yes Reyne Dumas, MD  lisinopril-hydrochlorothiazide (PRINZIDE,ZESTORETIC) 20-25 MG per tablet Take 1 tablet by mouth daily. 02/07/14  Yes Reyne Dumas, MD  meclizine (ANTIVERT) 25 MG tablet Take 1 tablet (25 mg total) by mouth 3 (three) times daily as needed for dizziness. 02/07/14  Yes Reyne Dumas, MD  Multiple Vitamin (MULTIVITAMIN WITH MINERALS) TABS Take 1 tablet by mouth daily.   Yes Historical Provider, MD  azithromycin (ZITHROMAX) 250 MG tablet Take 1 tablet (250 mg total) by mouth daily. Take first 2 tablets together, then 1 every day until finished. 06/08/14   Lahoma Rocker, PA  cephALEXin (KEFLEX) 500 MG capsule Take 1 capsule (500 mg total) by mouth 3 (three) times daily. 03/19/14   Lind Covert, MD  cyclobenzaprine (FLEXERIL) 5 MG tablet Take 1 tablet (5 mg total) by mouth 3 (three) times daily as needed for muscle spasms. 02/07/14  Reyne Dumas, MD  ferrous fumarate (HEMOCYTE - 106 MG FE) 325 (106 FE) MG TABS Take 1 tablet by mouth 2 (two) times daily.    Historical Provider, MD  Norgestimate-Ethinyl Estradiol Triphasic 0.18/0.215/0.25 MG-25 MCG tab Take 1 tablet by mouth daily. 01/22/14   Kassie Mends, MD  pantoprazole (PROTONIX) 40 MG tablet Take 1 tablet (40 mg total) by mouth daily. 02/07/14   Reyne Dumas, MD  traMADol (ULTRAM) 50 MG tablet Take 1 tablet (50 mg total) by mouth every 6 (six) hours as  needed. 02/07/14   Reyne Dumas, MD  trimethoprim-polymyxin b (POLYTRIM) ophthalmic solution Place 1 drop into the right eye every 4 (four) hours. 01/14/14   Gregor Hams, MD  Vitamin D, Ergocalciferol, (DRISDOL) 50000 UNITS CAPS capsule Take 1 capsule (50,000 Units total) by mouth every 7 (seven) days. 07/02/13   Angelica Chessman, MD  zolpidem (AMBIEN) 5 MG tablet Take 1 tablet (5 mg total) by mouth at bedtime as needed for sleep. 09/02/13   Angelica Chessman, MD   BP 128/88  Pulse 118  Temp(Src) 101 F (38.3 C) (Oral)  Resp 18  SpO2 97%  LMP 12/09/2013 Physical Exam  Nursing note and vitals reviewed. Constitutional: She is oriented to person, place, and time. She appears well-developed and well-nourished. No distress.  HENT:  Head: Normocephalic and atraumatic.  Right Ear: Hearing, tympanic membrane, external ear and ear canal normal.  Left Ear: Hearing, tympanic membrane, external ear and ear canal normal.  Nose: Nose normal.  Mouth/Throat: Uvula is midline, oropharynx is clear and moist and mucous membranes are normal. No oral lesions. No trismus in the jaw. No uvula swelling.  Eyes: Conjunctivae are normal. No scleral icterus.  Neck: Trachea normal, normal range of motion, full passive range of motion without pain and phonation normal. Neck supple. No muscular tenderness present. No rigidity. Normal range of motion present. No mass and no thyromegaly present.  Cardiovascular: Regular rhythm and normal heart sounds.  Tachycardia present.   Pulmonary/Chest: Effort normal and breath sounds normal. No respiratory distress. She has no wheezes.  Abdominal: Soft. Normal appearance. She exhibits no mass. Bowel sounds are decreased. There is no tenderness. There is no rebound and no guarding.  Neurological: She is alert and oriented to person, place, and time.  Skin: Skin is warm and dry.  Psychiatric: She has a normal mood and affect. Her behavior is normal.    ED Course  Procedures  (including critical care time) Labs Review Labs Reviewed  COMPREHENSIVE METABOLIC PANEL - Abnormal; Notable for the following:    Glucose, Bld 102 (*)    Calcium 11.0 (*)    Total Protein 8.7 (*)    AST 56 (*)    ALT 80 (*)    All other components within normal limits  POCT URINALYSIS DIP (DEVICE) - Abnormal; Notable for the following:    Bilirubin Urine SMALL (*)    pH 8.5 (*)    Protein, ur 30 (*)    All other components within normal limits  CBC  LIPASE, BLOOD  POCT PREGNANCY, URINE    Imaging Review Dg Chest 2 View  06/08/2014   CLINICAL DATA:  Three day history of fever.  EXAM: CHEST  2 VIEW  COMPARISON:  None.  FINDINGS: Initial PA image was obtained in near complete expiration, accounting for the apparent consolidation in the right lower lobe. This repeated in full inspiration with no residual right lower lobe opacity. Airspace consolidation in the right middle lobe.  Lungs otherwise clear. Normal pulmonary vascularity. No pneumothorax. No pleural effusions. Visualized bony thorax intact. Cardiomediastinal silhouette unremarkable.  IMPRESSION: Right middle lobe pneumonia.   Electronically Signed   By: Evangeline Dakin M.D.   On: 06/08/2014 10:35     MDM   1. CAP (community acquired pneumonia)    RML CAP to be treated with 5 day course of azithromycin and patient advised plenty of fluids and rest with ibuprofen as directed on packaging for fever and myalgias. PCP follow up in one week.   [place], [place] 06/08/14 1055

## 2014-06-08 NOTE — Discharge Instructions (Signed)
Plenty of fluids and rest. Ibuprofen as directed on packaging for aches and fever. Azithromycin as directed for pneumonia. Please follow up with your doctor in one week.  Pneumonia, Adult Pneumonia is an infection of the lungs.  CAUSES Pneumonia may be caused by bacteria or a virus. Usually, these infections are caused by breathing infectious particles into the lungs (respiratory tract). SYMPTOMS   Cough.  Fever.  Chest pain.  Increased rate of breathing.  Wheezing.  Mucus production. DIAGNOSIS  If you have the common symptoms of pneumonia, your caregiver will typically confirm the diagnosis with a chest X-ray. The X-ray will show an abnormality in the lung (pulmonary infiltrate) if you have pneumonia. Other tests of your blood, urine, or sputum may be done to find the specific cause of your pneumonia. Your caregiver may also do tests (blood gases or pulse oximetry) to see how well your lungs are working. TREATMENT  Some forms of pneumonia may be spread to other people when you cough or sneeze. You may be asked to wear a mask before and during your exam. Pneumonia that is caused by bacteria is treated with antibiotic medicine. Pneumonia that is caused by the influenza virus may be treated with an antiviral medicine. Most other viral infections must run their course. These infections will not respond to antibiotics.  PREVENTION A pneumococcal shot (vaccine) is available to prevent a common bacterial cause of pneumonia. This is usually suggested for:  People over 45 years old.  Patients on chemotherapy.  People with chronic lung problems, such as bronchitis or emphysema.  People with immune system problems. If you are over 65 or have a high risk condition, you may receive the pneumococcal vaccine if you have not received it before. In some countries, a routine influenza vaccine is also recommended. This vaccine can help prevent some cases of pneumonia.You may be offered the influenza  vaccine as part of your care. If you smoke, it is time to quit. You may receive instructions on how to stop smoking. Your caregiver can provide medicines and counseling to help you quit. HOME CARE INSTRUCTIONS   Cough suppressants may be used if you are losing too much rest. However, coughing protects you by clearing your lungs. You should avoid using cough suppressants if you can.  Your caregiver may have prescribed medicine if he or she thinks your pneumonia is caused by a bacteria or influenza. Finish your medicine even if you start to feel better.  Your caregiver may also prescribe an expectorant. This loosens the mucus to be coughed up.  Only take over-the-counter or prescription medicines for pain, discomfort, or fever as directed by your caregiver.  Do not smoke. Smoking is a common cause of bronchitis and can contribute to pneumonia. If you are a smoker and continue to smoke, your cough may last several weeks after your pneumonia has cleared.  A cold steam vaporizer or humidifier in your room or home may help loosen mucus.  Coughing is often worse at night. Sleeping in a semi-upright position in a recliner or using a couple pillows under your head will help with this.  Get rest as you feel it is needed. Your body will usually let you know when you need to rest. SEEK IMMEDIATE MEDICAL CARE IF:   Your illness becomes worse. This is especially true if you are elderly or weakened from any other disease.  You cannot control your cough with suppressants and are losing sleep.  You begin coughing up blood.  You develop pain which is getting worse or is uncontrolled with medicines.  You have a fever.  Any of the symptoms which initially brought you in for treatment are getting worse rather than better.  You develop shortness of breath or chest pain. MAKE SURE YOU:   Understand these instructions.  Will watch your condition.  Will get help right away if you are not doing well or  get worse. Document Released: 11/07/2005 Document Revised: 01/30/2012 Document Reviewed: 01/27/2011 [facility] Patient Information 2015 [place], [place]. This information is not intended to replace advice given to you by your health care provider. Make sure you discuss any questions you have with your health care provider.

## 2014-06-08 NOTE — ED Notes (Signed)
Tolerated water without vomiting.  Nausea gone.

## 2014-06-08 NOTE — ED Notes (Signed)
C/o chills and fever onset Friday.  C/o body aches, nausea, vomited once today, dizziness and generalized abdominal pain.  No diarrhea.  LD Motrin yesterday.

## 2014-06-11 NOTE — ED Provider Notes (Signed)
Medical screening examination/treatment/procedure(s) were performed by a resident physician or non-physician practitioner and as the supervising physician I was immediately available for consultation/collaboration.  Lynne Leader, MD    Gregor Hams, MD 06/11/14 [phone]

## 2014-06-27 ENCOUNTER — Telehealth: Payer: Self-pay | Admitting: Emergency Medicine

## 2014-06-27 ENCOUNTER — Telehealth: Payer: Self-pay | Admitting: Internal Medicine

## 2014-06-27 NOTE — Telephone Encounter (Signed)
Pt. States that current BP medication makes pt. Nauseous. Please f/u with pt.

## 2014-06-27 NOTE — Telephone Encounter (Signed)
Left message for pt to call when message received regarding bp refill

## 2014-07-03 ENCOUNTER — Telehealth: Payer: Self-pay | Admitting: Internal Medicine

## 2014-07-03 NOTE — Telephone Encounter (Signed)
Pt has called in today and would like to speak with a nurse urgently about her lisinopril-hydrochlorothiazide (PRINZIDE,ZESTORETIC) 20-25 MG per tablet medications; medication is causing some adverse side effects and patients would like a consult with the nurse about possible alternatives; please f/u

## 2014-07-04 ENCOUNTER — Telehealth: Payer: Self-pay | Admitting: Emergency Medicine

## 2014-07-04 ENCOUNTER — Telehealth: Payer: Self-pay | Admitting: *Deleted

## 2014-07-04 ENCOUNTER — Other Ambulatory Visit: Payer: Self-pay | Admitting: Emergency Medicine

## 2014-07-04 DIAGNOSIS — I10 Essential (primary) hypertension: Secondary | ICD-10-CM

## 2014-07-04 MED ORDER — LISINOPRIL-HYDROCHLOROTHIAZIDE 20-25 MG PO TABS: 1.0000 | ORAL_TABLET | Freq: Every day | ORAL | Status: DC

## 2014-07-04 NOTE — Telephone Encounter (Signed)
Its been 4 months since pt has been in out clinic. I instructed the pt to get another appointment in order to adjust her medications.

## 2014-07-07 ENCOUNTER — Encounter: Payer: Self-pay | Admitting: Internal Medicine

## 2014-07-07 ENCOUNTER — Ambulatory Visit: Payer: No Typology Code available for payment source | Attending: Internal Medicine | Admitting: Internal Medicine

## 2014-07-07 ENCOUNTER — Other Ambulatory Visit: Payer: Self-pay | Admitting: Internal Medicine

## 2014-07-07 VITALS — BP 152/89 | HR 64 | Temp 98.5°F | Resp 18 | Ht 66.0 in | Wt 187.6 lb

## 2014-07-07 DIAGNOSIS — IMO0001 Reserved for inherently not codable concepts without codable children: Secondary | ICD-10-CM | POA: Insufficient documentation

## 2014-07-07 DIAGNOSIS — H8110 Benign paroxysmal vertigo, unspecified ear: Secondary | ICD-10-CM

## 2014-07-07 DIAGNOSIS — I10 Essential (primary) hypertension: Secondary | ICD-10-CM

## 2014-07-07 DIAGNOSIS — Z885 Allergy status to narcotic agent status: Secondary | ICD-10-CM | POA: Insufficient documentation

## 2014-07-07 DIAGNOSIS — N951 Menopausal and female climacteric states: Secondary | ICD-10-CM | POA: Insufficient documentation

## 2014-07-07 DIAGNOSIS — D649 Anemia, unspecified: Secondary | ICD-10-CM | POA: Insufficient documentation

## 2014-07-07 DIAGNOSIS — H8113 Benign paroxysmal vertigo, bilateral: Secondary | ICD-10-CM

## 2014-07-07 DIAGNOSIS — M5430 Sciatica, unspecified side: Secondary | ICD-10-CM | POA: Insufficient documentation

## 2014-07-07 DIAGNOSIS — E559 Vitamin D deficiency, unspecified: Secondary | ICD-10-CM | POA: Insufficient documentation

## 2014-07-07 DIAGNOSIS — R8290 Unspecified abnormal findings in urine: Secondary | ICD-10-CM | POA: Insufficient documentation

## 2014-07-07 DIAGNOSIS — R82998 Other abnormal findings in urine: Secondary | ICD-10-CM

## 2014-07-07 DIAGNOSIS — Z888 Allergy status to other drugs, medicaments and biological substances status: Secondary | ICD-10-CM | POA: Insufficient documentation

## 2014-07-07 DIAGNOSIS — R3919 Other difficulties with micturition: Secondary | ICD-10-CM | POA: Insufficient documentation

## 2014-07-07 HISTORY — DX: Menopausal and female climacteric states: N95.1

## 2014-07-07 LAB — POCT URINALYSIS DIPSTICK
Bilirubin, UA: NEGATIVE
Blood, UA: NEGATIVE
Glucose, UA: NEGATIVE
KETONES UA: NEGATIVE
LEUKOCYTES UA: NEGATIVE
NITRITE UA: NEGATIVE
Protein, UA: NEGATIVE
Spec Grav, UA: 1.02
UROBILINOGEN UA: 2
pH, UA: 6.5

## 2014-07-07 MED ORDER — MECLIZINE HCL 25 MG PO TABS: 25.0000 mg | ORAL_TABLET | Freq: Three times a day (TID) | ORAL | Status: DC | PRN

## 2014-07-07 MED ORDER — PAROXETINE HCL 20 MG PO TABS: 20.0000 mg | ORAL_TABLET | Freq: Every day | ORAL | Status: DC

## 2014-07-07 MED ORDER — DESONIDE 0.05 % EX CREA: TOPICAL_CREAM | Freq: Two times a day (BID) | CUTANEOUS | Status: DC

## 2014-07-07 MED ORDER — AMLODIPINE BESYLATE 10 MG PO TABS: 10.0000 mg | ORAL_TABLET | Freq: Every day | ORAL | Status: DC

## 2014-07-07 NOTE — Progress Notes (Signed)
Patient ID: Katelyn Hardin, female   DOB: 09-20-1971, 43 y.o.   MRN: [id number]   Katelyn Hardin, is a 43 y.o. female  [id number]  [id number]  DOB - January 14, 1971  Chief Complaint  Patient presents with  . Follow-up  . Hypertension        Subjective:   Katelyn Hardin is a 43 y.o. female here today for a follow up visit. Patient has history of hypertension and fibromyalgia, here today for medication refill. She also want to ask if by prescription for weight loss. Blood pressure will have been well controlled blood pressure ran out of all medications given today blood pressure slightly elevated. Patient has no significant complaint today except for ongoing postmenopausal symptoms including excessive hot flashes. Patient is to come to trying medications to suppress the symptoms. For the past 2 days patient noticed that his urine has changed color to cloudy and foul smelling, she denies dysuria, frequency, or urgency. Patient has No headache, No chest pain, No abdominal pain - No Nausea, No new weakness tingling or numbness, No Cough - SOB.  Problem  Malodorous Urine  Menopausal Hot Flushes    ALLERGIES: Allergies  Allergen Reactions  . Acetaminophen Other (See Comments)    "Bothers her- jittery and nausea."  . Codeine     Upset stomach  . Oxycodone-Acetaminophen     Upset stomach  . Vicodin [Hydrocodone-Acetaminophen] Nausea Only    PAST MEDICAL HISTORY: Past Medical History  Diagnosis Date  . Bacterial vaginosis   . Hypertension   . Anemia   . Fibromyalgia   . Fibroids     MEDICATIONS AT HOME: Prior to Admission medications   Medication Sig Start Date End Date Taking? Authorizing Provider  desonide (DESOWEN) 0.05 % cream Apply topically 2 (two) times daily. 07/07/14  Yes Angelica Chessman, MD  gabapentin (NEURONTIN) 300 MG capsule Take 1 capsule (300 mg total) by mouth 3 (three) times daily. 02/07/14  Yes Reyne Dumas, MD  Multiple Vitamin  (MULTIVITAMIN WITH MINERALS) TABS Take 1 tablet by mouth daily.   Yes Historical Provider, MD  traMADol (ULTRAM) 50 MG tablet Take 1 tablet (50 mg total) by mouth every 6 (six) hours as needed. 02/07/14  Yes Reyne Dumas, MD  amLODipine (NORVASC) 10 MG tablet Take 1 tablet (10 mg total) by mouth daily. 07/07/14 07/07/15  Angelica Chessman, MD  azithromycin (ZITHROMAX) 250 MG tablet Take 1 tablet (250 mg total) by mouth daily. Take first 2 tablets together, then 1 every day until finished. 06/08/14   Audelia Hives Presson, PA  cephALEXin (KEFLEX) 500 MG capsule Take 1 capsule (500 mg total) by mouth 3 (three) times daily. 03/19/14   Lind Covert, MD  cyclobenzaprine (FLEXERIL) 5 MG tablet Take 1 tablet (5 mg total) by mouth 3 (three) times daily as needed for muscle spasms. 02/07/14   Reyne Dumas, MD  ferrous fumarate (HEMOCYTE - 106 MG FE) 325 (106 FE) MG TABS Take 1 tablet by mouth 2 (two) times daily.    Historical Provider, MD  meclizine (ANTIVERT) 25 MG tablet Take 1 tablet (25 mg total) by mouth 3 (three) times daily as needed for dizziness. 07/07/14   Angelica Chessman, MD  Norgestimate-Ethinyl Estradiol Triphasic 0.18/0.215/0.25 MG-25 MCG tab Take 1 tablet by mouth daily. 01/22/14   Kassie Mends, MD  pantoprazole (PROTONIX) 40 MG tablet Take 1 tablet (40 mg total) by mouth daily. 02/07/14   Reyne Dumas, MD  PARoxetine (PAXIL) 20 MG tablet Take 1 tablet (20 mg  total) by mouth daily. 07/07/14 07/07/15  Angelica Chessman, MD  trimethoprim-polymyxin b (POLYTRIM) ophthalmic solution Place 1 drop into the right eye every 4 (four) hours. 01/14/14   Gregor Hams, MD  Vitamin D, Ergocalciferol, (DRISDOL) 50000 UNITS CAPS capsule Take 1 capsule (50,000 Units total) by mouth every 7 (seven) days. 07/02/13   Angelica Chessman, MD  zolpidem (AMBIEN) 5 MG tablet Take 1 tablet (5 mg total) by mouth at bedtime as needed for sleep. 09/02/13   Angelica Chessman, MD     Objective:   Filed Vitals:   07/07/14 1414   BP: 152/89  Pulse: 64  Temp: 98.5 F (36.9 C)  TempSrc: Oral  Resp: 18  Height: 5\' 6"  (1.676 m)  Weight: 85.095 kg (187 lb 9.6 oz)  SpO2: 99%    Exam General appearance : Awake, alert, not in any distress. Speech Clear. Not toxic looking HEENT: Atraumatic and Normocephalic, pupils equally reactive to light and accomodation Neck: supple, no JVD. No cervical lymphadenopathy.  Chest:Good air entry bilaterally, no added sounds  CVS: S1 S2 regular, no murmurs.  Abdomen: Bowel sounds present, Non tender and not distended with no gaurding, rigidity or rebound. Extremities: B/L Lower Ext shows no edema, both legs are warm to touch Neurology: Awake alert, and oriented X 3, CN II-XII intact, Non focal Skin:No Rash Wounds:N/A  Data Review Lab Results  Component Value Date   HGBA1C 5.0 02/22/2013     Assessment & Plan   1. Malodorous urine  - Urinalysis Dipstick is negative for infection  2. Essential hypertension, benign  - amLODipine (NORVASC) 10 MG tablet; Take 1 tablet (10 mg total) by mouth daily.  Dispense: 30 tablet; Refill: 3 - COMPLETE METABOLIC PANEL WITH GFR  3. Benign paroxysmal positional vertigo, bilateral  - meclizine (ANTIVERT) 25 MG tablet; Take 1 tablet (25 mg total) by mouth 3 (three) times daily as needed for dizziness.  Dispense: 60 tablet; Refill: 0  4. Unspecified vitamin D deficiency  - Vitamin D 25 hydroxy  5. Sciatica neuralgia, unspecified laterality  - desonide (DESOWEN) 0.05 % cream; Apply topically 2 (two) times daily.  Dispense: 30 g; Refill: 1  6. Menopausal hot flushes Patient agrees to trial of SSRI for her postmenopausal vasomotor symptoms of hot flashes - PARoxetine (PAXIL) 20 MG tablet; Take 1 tablet (20 mg total) by mouth daily.  Dispense: 30 tablet; Refill: 2  Patient was extensively counseled on nutrition and exercise  Return in about 3 months (around 10/07/2014), or if symptoms worsen or fail to improve, for Follow up HTN, Follow  up Pain and comorbidities, Annual Physical.  The patient was given clear instructions to go to ER or return to medical center if symptoms don't improve, worsen or new problems develop. The patient verbalized understanding. The patient was told to call to get lab results if they haven't heard anything in the next week.   This note has been created with Surveyor, quantity. Any transcriptional errors are unintentional.    Angelica Chessman, MD, [place], [place], [place] and [place], [place]   07/07/2014, 3:29 PM

## 2014-07-07 NOTE — Patient Instructions (Signed)
Calorie Counting for Weight Loss Calories are energy you get from the things you eat and drink. Your body uses this energy to keep you going throughout the day. The number of calories you eat affects your weight. When you eat more calories than your body needs, your body stores the extra calories as fat. When you eat fewer calories than your body needs, your body burns fat to get the energy it needs. Calorie counting means keeping track of how many calories you eat and drink each day. If you make sure to eat fewer calories than your body needs, you should lose weight. In order for calorie counting to work, you will need to eat the number of calories that are right for you in a day to lose a healthy amount of weight per week. A healthy amount of weight to lose per week is usually 1-2 lb (0.5-0.9 kg). A dietitian can determine how many calories you need in a day and give you suggestions on how to reach your calorie goal.  WHAT IS MY MY PLAN? My goal is to have __________ calories per day.  If I have this many calories per day, I should lose around __________ pounds per week. WHAT DO I NEED TO KNOW ABOUT CALORIE COUNTING? In order to meet your daily calorie goal, you will need to:  Find out how many calories are in each food you would like to eat. Try to do this before you eat.  Decide how much of the food you can eat.  Write down what you ate and how many calories it had. Doing this is called keeping a food log. WHERE DO I FIND CALORIE INFORMATION? The number of calories in a food can be found on a Nutrition Facts label. Note that all the information on a label is based on a specific serving of the food. If a food does not have a Nutrition Facts label, try to look up the calories online or ask your dietitian for help. HOW DO I DECIDE HOW MUCH TO EAT? To decide how much of the food you can eat, you will need to consider both the number of calories in one serving and the size of one serving. This  information can be found on the Nutrition Facts label. If a food does not have a Nutrition Facts label, look up the information online or ask your dietitian for help. Remember that calories are listed per serving. If you choose to have more than one serving of a food, you will have to multiply the calories per serving by the amount of servings you plan to eat. For example, the label on a package of bread might say that a serving size is 1 slice and that there are 90 calories in a serving. If you eat 1 slice, you will have eaten 90 calories. If you eat 2 slices, you will have eaten 180 calories. HOW DO I KEEP A FOOD LOG? After each meal, record the following information in your food log:  What you ate.  How much of it you ate.  How many calories it had.  Then, add up your calories. Keep your food log near you, such as in a small notebook in your pocket. Another option is to use a mobile app or website. Some programs will calculate calories for you and show you how many calories you have left each time you add an item to the log. WHAT ARE SOME CALORIE COUNTING TIPS?  Use your calories on foods   and drinks that will fill you up and not leave you hungry. Some examples of this include foods like nuts and nut butters, vegetables, lean proteins, and high-fiber foods (more than 5 g fiber per serving).  Eat nutritious foods and avoid empty calories. Empty calories are calories you get from foods or beverages that do not have many nutrients, such as candy and soda. It is better to have a nutritious high-calorie food (such as an avocado) than a food with few nutrients (such as a bag of chips).  Know how many calories are in the foods you eat most often. This way, you do not have to look up how many calories they have each time you eat them.  Look out for foods that may seem like low-calorie foods but are really high-calorie foods, such as baked goods, soda, and fat-free candy.  Pay attention to calories  in drinks. Drinks such as sodas, specialty coffee drinks, alcohol, and juices have a lot of calories yet do not fill you up. Choose low-calorie drinks like water and diet drinks.  Focus your calorie counting efforts on higher calorie items. Logging the calories in a garden salad that contains only vegetables is less important than calculating the calories in a milk shake.  Find a way of tracking calories that works for you. Get creative. Most people who are successful find ways to keep track of how much they eat in a day, even if they do not count every calorie. WHAT ARE SOME PORTION CONTROL TIPS?  Know how many calories are in a serving. This will help you know how many servings of a certain food you can have.  Use a measuring cup to measure serving sizes. This is helpful when you start out. With time, you will be able to estimate serving sizes for some foods.  Take some time to put servings of different foods on your favorite plates, bowls, and cups so you know what a serving looks like.  Try not to eat straight from a bag or box. Doing this can lead to overeating. Put the amount you would like to eat in a cup or on a plate to make sure you are eating the right portion.  Use smaller plates, glasses, and bowls to prevent overeating. This is a quick and easy way to practice portion control. If your plate is smaller, less food can fit on it.  Try not to multitask while eating, such as watching TV or using your computer. If it is time to eat, sit down at a table and enjoy your food. Doing this will help you to start recognizing when you are full. It will also make you more aware of what and how much you are eating. HOW CAN I CALORIE COUNT WHEN EATING OUT?  Ask for smaller portion sizes or child-sized portions.  Consider sharing an entree and sides instead of getting your own entree.  If you get your own entree, eat only half. Ask for a box at the beginning of your meal and put the rest of your  entree in it so you are not tempted to eat it.  Look for the calories on the menu. If calories are listed, choose the lower calorie options.  Choose dishes that include vegetables, fruits, whole grains, low-fat dairy products, and lean protein. Focusing on smart food choices from each of the 5 food groups can help you stay on track at restaurants.  Choose items that are boiled, broiled, grilled, or steamed.  Choose   water, milk, unsweetened iced tea, or other drinks without added sugars. If you want an alcoholic beverage, choose a lower calorie option. For example, a regular margarita can have up to 700 calories and a glass of wine has around 150.  Stay away from items that are buttered, battered, fried, or served with cream sauce. Items labeled "crispy" are usually fried, unless stated otherwise.  Ask for dressings, sauces, and syrups on the side. These are usually very high in calories, so do not eat much of them.  Watch out for salads. Many people think salads are a healthy option, but this is often not the case. Many salads come with bacon, fried chicken, lots of cheese, fried chips, and dressing. All of these items have a lot of calories. If you want a salad, choose a garden salad and ask for grilled meats or steak. Ask for the dressing on the side, or ask for olive oil and vinegar or lemon to use as dressing.  Estimate how many servings of a food you are given. For example, a serving of cooked rice is  cup or about the size of half a tennis ball or one cupcake wrapper. Knowing serving sizes will help you be aware of how much food you are eating at restaurants. The list below tells you how big or small some common portion sizes are based on everyday objects.  1 oz--4 stacked dice.  3 oz--1 deck of cards.  1 tsp--1 dice.  1 Tbsp-- a Ping-Pong ball.  2 Tbsp--1 Ping-Pong ball.   cup--1 tennis ball or 1 cupcake wrapper.  1 cup--1 baseball. Document Released: 11/07/2005 Document  Revised: 03/24/2014 Document Reviewed: 09/12/2013 [facility] Patient Information 2015 [place], [place]. This information is not intended to replace advice given to you by your health care provider. Make sure you discuss any questions you have with your health care provider. Hypertension Hypertension, commonly called high blood pressure, is when the force of blood pumping through your arteries is too strong. Your arteries are the blood vessels that carry blood from your heart throughout your body. A blood pressure reading consists of a higher number over a lower number, such as 110/72. The higher number (systolic) is the pressure inside your arteries when your heart pumps. The lower number (diastolic) is the pressure inside your arteries when your heart relaxes. Ideally you want your blood pressure below 120/80. Hypertension forces your heart to work harder to pump blood. Your arteries may become narrow or stiff. Having hypertension puts you at risk for heart disease, stroke, and other problems.  RISK FACTORS Some risk factors for high blood pressure are controllable. Others are not.  Risk factors you cannot control include:   Race. You may be at higher risk if you are African American.  Age. Risk increases with age.  Gender. Men are at higher risk than women before age 18 years. After age 54, women are at higher risk than men. Risk factors you can control include:  Not getting enough exercise or physical activity.  Being overweight.  Getting too much fat, sugar, calories, or salt in your diet.  Drinking too much alcohol. SIGNS AND SYMPTOMS Hypertension does not usually cause signs or symptoms. Extremely high blood pressure (hypertensive crisis) may cause headache, anxiety, shortness of breath, and nosebleed. DIAGNOSIS  To check if you have hypertension, your health care provider will measure your blood pressure while you are seated, with your arm held at the level of your heart. It should be  measured at  least twice using the same arm. Certain conditions can cause a difference in blood pressure between your right and left arms. A blood pressure reading that is higher than normal on one occasion does not mean that you need treatment. If one blood pressure reading is high, ask your health care provider about having it checked again. TREATMENT  Treating high blood pressure includes making lifestyle changes and possibly taking medicine. Living a healthy lifestyle can help lower high blood pressure. You may need to change some of your habits. Lifestyle changes may include:  Following the DASH diet. This diet is high in fruits, vegetables, and whole grains. It is low in salt, red meat, and added sugars.  Getting at least 2 hours of brisk physical activity every week.  Losing weight if necessary.  Not smoking.  Limiting alcoholic beverages.  Learning ways to reduce stress. If lifestyle changes are not enough to get your blood pressure under control, your health care provider may prescribe medicine. You may need to take more than one. Work closely with your health care provider to understand the risks and benefits. HOME CARE INSTRUCTIONS  Have your blood pressure rechecked as directed by your health care provider.   Take medicines only as directed by your health care provider. Follow the directions carefully. Blood pressure medicines must be taken as prescribed. The medicine does not work as well when you skip doses. Skipping doses also puts you at risk for problems.   Do not smoke.   Monitor your blood pressure at home as directed by your health care provider. SEEK MEDICAL CARE IF:   You think you are having a reaction to medicines taken.  You have recurrent headaches or feel dizzy.  You have swelling in your ankles.  You have trouble with your vision. SEEK IMMEDIATE MEDICAL CARE IF:  You develop a severe headache or confusion.  You have unusual weakness, numbness,  or feel faint.  You have severe chest or abdominal pain.  You vomit repeatedly.  You have trouble breathing. MAKE SURE YOU:   Understand these instructions.  Will watch your condition.  Will get help right away if you are not doing well or get worse. Document Released: 11/07/2005 Document Revised: 03/24/2014 Document Reviewed: 08/30/2013 [facility] Patient Information 2015 [place], [place]. This information is not intended to replace advice given to you by your health care provider. Make sure you discuss any questions you have with your health care provider.

## 2014-07-07 NOTE — Progress Notes (Signed)
Patient presents for f/u on HTN States new BP med is causing nausea. Ran out 2 weeks ago. P 152/89 at present. C/O 2 day history of cloudy and malodorous urine Has run out of most meds. Requesting med refills Woud like to discuss help for weight loss

## 2014-07-08 LAB — VITAMIN D 25 HYDROXY (VIT D DEFICIENCY, FRACTURES): Vit D, 25-Hydroxy: 37 ng/mL (ref 30–89)

## 2014-07-08 LAB — COMPLETE METABOLIC PANEL WITH GFR
ALK PHOS: 77 U/L (ref 39–117)
ALT: 35 U/L (ref 0–35)
AST: 29 U/L (ref 0–37)
Albumin: 4.2 g/dL (ref 3.5–5.2)
BILIRUBIN TOTAL: 0.3 mg/dL (ref 0.2–1.2)
BUN: 10 mg/dL (ref 6–23)
CO2: 29 meq/L (ref 19–32)
Calcium: 11.2 mg/dL — ABNORMAL HIGH (ref 8.4–10.5)
Chloride: 108 mEq/L (ref 96–112)
Creat: 0.63 mg/dL (ref 0.50–1.10)
GFR, Est African American: >89 mL/min
GFR, Est Non African American: >89 mL/min
Glucose, Bld: 92 mg/dL (ref 70–99)
Potassium: 4.3 mEq/L (ref 3.5–5.3)
SODIUM: 140 meq/L (ref 135–145)
Total Protein: 7 g/dL (ref 6.0–8.3)

## 2014-07-15 ENCOUNTER — Telehealth: Payer: Self-pay | Admitting: Internal Medicine

## 2014-07-15 NOTE — Telephone Encounter (Signed)
Pt aware of lab results. Pt not taking Calcium supplement Discussed normal liver function tests    Notes Recorded by Angelica Chessman, MD on 07/09/2014 at 11:16 AM Please inform patient that her laboratory tests results are mostly within normal limit except for her calcium level that is slightly high and has been like that for about 5 years, in her next appointment we will check again and do some more testing. If she takes over-the-counter calcium supplement, he can stop now or reduce dose to avoid too much calcium buildup in her system. Your vitamin D level is normal

## 2014-07-15 NOTE — Telephone Encounter (Signed)
Pt. Called to request lab results. Please f/u with pt.

## 2014-07-16 ENCOUNTER — Telehealth: Payer: Self-pay | Admitting: Emergency Medicine

## 2014-07-16 NOTE — Telephone Encounter (Signed)
Pt given lab work results

## 2014-07-16 NOTE — Telephone Encounter (Signed)
Message copied by Ricci Barker on Wed Jul 16, 2014  2:55 PM ------      Message from: Angelica Chessman E      Created: Wed Jul 09, 2014 11:16 AM       Please inform patient that her laboratory tests results are mostly within normal limit except for her calcium level that is slightly high and has been like that for about 5 years, in her next appointment we will check again and do some more testing. If she takes over-the-counter calcium supplement, he can stop now or reduce dose to avoid too much calcium buildup in her system. Your vitamin D level is normal ------

## 2014-07-25 ENCOUNTER — Telehealth: Payer: Self-pay | Admitting: Internal Medicine

## 2014-07-25 NOTE — Telephone Encounter (Signed)
Pt called stating that she has severe swelling in her feet that has been going on for about a week now. She has been trying to call us since yesterday to schedule an appt. She stopped taking her BP medication and the other medication for her hot flashes 3 days ago because she thinks that one of these medications might be causing the swelling. Please follow up with patient for advise. Pt requested that Ms. Sharee Pimple call her back.

## 2014-07-29 ENCOUNTER — Telehealth: Payer: Self-pay | Admitting: Emergency Medicine

## 2014-07-29 NOTE — Telephone Encounter (Signed)
Left message for pt to return call.

## 2014-07-30 ENCOUNTER — Telehealth: Payer: Self-pay | Admitting: Emergency Medicine

## 2014-07-30 NOTE — Telephone Encounter (Signed)
Pt has been complaining of lower extrem swelling without sob or chest pain. States sx's started 3 weeks ago after prescribed new blood pressure medication Amlodipine and Paxil for hot flashes  States swelling has improved after stopped taking medication Pt has not checked blood pressure

## 2014-08-01 ENCOUNTER — Ambulatory Visit: Payer: No Typology Code available for payment source | Attending: Internal Medicine

## 2014-08-01 ENCOUNTER — Other Ambulatory Visit: Payer: Self-pay | Admitting: Internal Medicine

## 2014-08-01 VITALS — BP 144/91 | HR 63 | Resp 16

## 2014-08-01 DIAGNOSIS — Z23 Encounter for immunization: Secondary | ICD-10-CM

## 2014-08-01 MED ORDER — HYDROCHLOROTHIAZIDE 25 MG PO TABS: 25.0000 mg | ORAL_TABLET | Freq: Every day | ORAL | Status: DC

## 2014-08-01 MED ORDER — ZOLPIDEM TARTRATE 5 MG PO TABS: 5.0000 mg | ORAL_TABLET | Freq: Every evening | ORAL | Status: DC | PRN

## 2014-08-01 NOTE — Patient Instructions (Signed)
Take new prescribed medications as prescribed and return to clinic in 2 weeks for nurse visit.

## 2014-08-15 ENCOUNTER — Ambulatory Visit: Payer: Self-pay | Attending: Internal Medicine

## 2014-08-15 VITALS — BP 144/90 | HR 64 | Resp 16

## 2014-08-15 DIAGNOSIS — Z23 Encounter for immunization: Secondary | ICD-10-CM

## 2014-08-15 NOTE — Progress Notes (Unsigned)
Patient ID: Katelyn Hardin, female   DOB: [birth year], 43 y.o.   MRN: [id number] Pt here for blood pressure recheck with taking prescribed Hydrochlorothiazide 25 mg tab as ordered  Pt states she feeling better. BP 144/90 60

## 2014-08-15 NOTE — Patient Instructions (Signed)
Continue taking prescribed medication as prescribed Flu vaccine given

## 2014-09-02 ENCOUNTER — Telehealth: Payer: Self-pay | Admitting: Internal Medicine

## 2014-09-02 NOTE — Telephone Encounter (Signed)
Pt. Called stating that she is in a lot of pain due to her fibroids, pt. Also states that she feels a knot on her stomach with a lot of pain. Please f/u with pt.

## 2014-09-03 ENCOUNTER — Ambulatory Visit: Payer: Self-pay

## 2014-09-04 ENCOUNTER — Telehealth: Payer: Self-pay | Admitting: Emergency Medicine

## 2014-09-04 NOTE — Telephone Encounter (Signed)
Pt states she is feeling better today. States last month pain was very bad with palpable knot middle of breast bone .  Scheduled f/u appointment with Dr. Doreene Burke 09/15/14 @ 1115 am Informed pt if sx's worsens with increased abdominal pain or bleeding go to ER

## 2014-09-15 ENCOUNTER — Ambulatory Visit: Payer: Self-pay | Admitting: Internal Medicine

## 2014-09-16 NOTE — Telephone Encounter (Signed)
Pt has been complaining of lower extrem swelling without sob or chest pain.  States sx's started 3 weeks ago after prescribed new blood pressure medication Amlodipine and Paxil for hot flashes  States swelling has improved after stopped taking medication  Pt has not checked blood pressure

## 2014-10-06 ENCOUNTER — Ambulatory Visit (INDEPENDENT_AMBULATORY_CARE_PROVIDER_SITE_OTHER): Payer: Self-pay | Admitting: Obstetrics & Gynecology

## 2014-10-06 ENCOUNTER — Encounter: Payer: Self-pay | Admitting: Obstetrics & Gynecology

## 2014-10-06 VITALS — BP 134/95 | HR 79 | Temp 98.0°F | Ht 66.0 in | Wt 191.5 lb

## 2014-10-06 DIAGNOSIS — B9689 Other specified bacterial agents as the cause of diseases classified elsewhere: Secondary | ICD-10-CM

## 2014-10-06 DIAGNOSIS — D259 Leiomyoma of uterus, unspecified: Secondary | ICD-10-CM

## 2014-10-06 DIAGNOSIS — N760 Acute vaginitis: Secondary | ICD-10-CM

## 2014-10-06 DIAGNOSIS — A499 Bacterial infection, unspecified: Secondary | ICD-10-CM

## 2014-10-06 MED ORDER — METRONIDAZOLE 500 MG PO TABS: 500.0000 mg | ORAL_TABLET | Freq: Two times a day (BID) | ORAL | Status: DC

## 2014-10-06 NOTE — Progress Notes (Signed)
Patient ID: Katelyn Hardin, female   DOB: 09-10-1971, 43 y.o.   MRN: [id number]  Chief Complaint  Patient presents with  . Gynecologic Exam    HPI Katelyn Hardin is a 43 y.o. female.  [id number] Patient's last menstrual period was 08/11/2014. H/o fibroids, has concern that upper abdominal and low abd discomforts are due to these. Menses are light and come q 2-3 mo  HPI  Past Medical History  Diagnosis Date  . Bacterial vaginosis   . Hypertension   . Anemia   . Fibromyalgia   . Fibroids     Past Surgical History  Procedure Laterality Date  . Tubal ligation    . Tubal ligation      Family History  Problem Relation Age of Onset  . Thyroid disease Sister   . Cancer Maternal Aunt     breast    Social History History  Substance Use Topics  . Smoking status: Never Smoker   . Smokeless tobacco: Never Used  . Alcohol Use: 2.0 oz/week    4 drink(s) per week     Comment: 3x/wk    Allergies  Allergen Reactions  . Acetaminophen Other (See Comments)    "Bothers her- jittery and nausea."  . Codeine     Upset stomach  . Oxycodone-Acetaminophen     Upset stomach  . Vicodin [Hydrocodone-Acetaminophen] Nausea Only    Current Outpatient Prescriptions  Medication Sig Dispense Refill  . cyclobenzaprine (FLEXERIL) 5 MG tablet Take 1 tablet (5 mg total) by mouth 3 (three) times daily as needed for muscle spasms. 30 tablet 1  . desonide (DESOWEN) 0.05 % cream Apply topically 2 (two) times daily. 30 g 1  . hydrochlorothiazide (HYDRODIURIL) 25 MG tablet Take 1 tablet (25 mg total) by mouth daily. 90 tablet 3  . meclizine (ANTIVERT) 25 MG tablet Take 1 tablet (25 mg total) by mouth 3 (three) times daily as needed for dizziness. 60 tablet 0  . Multiple Vitamin (MULTIVITAMIN WITH MINERALS) TABS Take 1 tablet by mouth daily.    . traMADol (ULTRAM) 50 MG tablet Take 1 tablet (50 mg total) by mouth every 6 (six) hours as needed. 60 tablet 0  . Vitamin D, Ergocalciferol,  (DRISDOL) 50000 UNITS CAPS capsule Take 1 capsule (50,000 Units total) by mouth every 7 (seven) days. 8 capsule 2  . azithromycin (ZITHROMAX) 250 MG tablet Take 1 tablet (250 mg total) by mouth daily. Take first 2 tablets together, then 1 every day until finished. 6 tablet 0  . cephALEXin (KEFLEX) 500 MG capsule Take 1 capsule (500 mg total) by mouth 3 (three) times daily. 15 capsule 0  . ferrous fumarate (HEMOCYTE - 106 MG FE) 325 (106 FE) MG TABS Take 1 tablet by mouth 2 (two) times daily.    Marland Kitchen gabapentin (NEURONTIN) 300 MG capsule Take 1 capsule (300 mg total) by mouth 3 (three) times daily. 90 capsule 3  . metroNIDAZOLE (FLAGYL) 500 MG tablet Take 1 tablet (500 mg total) by mouth 2 (two) times daily. 14 tablet 0  . Norgestimate-Ethinyl Estradiol Triphasic 0.18/0.215/0.25 MG-25 MCG tab Take 1 tablet by mouth daily. 1 Package 3  . pantoprazole (PROTONIX) 40 MG tablet Take 1 tablet (40 mg total) by mouth daily. 30 tablet 3  . PARoxetine (PAXIL) 20 MG tablet Take 1 tablet (20 mg total) by mouth daily. 30 tablet 2  . trimethoprim-polymyxin b (POLYTRIM) ophthalmic solution Place 1 drop into the right eye every 4 (four) hours. 10 mL 0  . zolpidem (  AMBIEN) 5 MG tablet Take 1 tablet (5 mg total) by mouth at bedtime as needed for sleep. 30 tablet 1   No current facility-administered medications for this visit.  not on OCP  Review of Systems Review of Systems  Gastrointestinal: Positive for abdominal pain and abdominal distention (upper epigastric). Negative for constipation and blood in stool.  Genitourinary: Positive for vaginal discharge (thinks it is BV). Negative for dysuria, menstrual problem and pelvic pain.    Blood pressure 134/95, pulse 79, temperature 98 F (36.7 C), height 5\' 6"  (1.676 m), weight 191 lb 8 oz (86.864 kg), last menstrual period 08/11/2014.  Physical Exam Physical Exam  Constitutional: She is oriented to person, place, and time. She appears well-developed. No distress.   Pulmonary/Chest: Effort normal. No respiratory distress.  Abdominal: Soft. She exhibits no mass. There is no tenderness.  Genitourinary: Vagina normal and uterus normal. No vaginal discharge found.  Neurological: She is alert and oriented to person, place, and time.  Skin: Skin is warm and dry.  Psychiatric: She has a normal mood and affect. Her behavior is normal.    Data Reviewed Previous US 2013  Assessment    Fibroid uterus but no menstrual problem. Describes possible hernia. Wants tx for BV     Plan    Flagyl 7 days Repeat US, report result to pt. RTC 1 yr repeat pap        ARNOLD,JAMES 10/06/2014, 4:47 PM

## 2014-10-13 ENCOUNTER — Other Ambulatory Visit: Payer: Self-pay | Admitting: *Deleted

## 2014-10-13 DIAGNOSIS — B9689 Other specified bacterial agents as the cause of diseases classified elsewhere: Secondary | ICD-10-CM

## 2014-10-13 DIAGNOSIS — N760 Acute vaginitis: Principal | ICD-10-CM

## 2014-10-13 MED ORDER — METRONIDAZOLE 500 MG PO TABS: 500.0000 mg | ORAL_TABLET | Freq: Two times a day (BID) | ORAL | Status: AC

## 2014-10-13 NOTE — Progress Notes (Signed)
Pt called and said that she accidentally drop her antibiotic in her toliet. She was asking for another refill. So I refilled the rx and sent it to her pharmacy.

## 2014-10-14 ENCOUNTER — Ambulatory Visit (HOSPITAL_COMMUNITY)
Admission: RE | Admit: 2014-10-14 | Discharge: 2014-10-14 | Disposition: A | Discharge status: Home / Self Care | Payer: Self-pay | Source: Ambulatory Visit | Attending: Obstetrics & Gynecology | Admitting: Obstetrics & Gynecology

## 2014-10-14 DIAGNOSIS — D259 Leiomyoma of uterus, unspecified: Secondary | ICD-10-CM | POA: Insufficient documentation

## 2014-10-30 ENCOUNTER — Telehealth: Payer: Self-pay | Admitting: Internal Medicine

## 2014-10-30 NOTE — Telephone Encounter (Signed)
Patient is calling in today to request refill for UTI medication; please f/u with patient

## 2014-11-03 ENCOUNTER — Ambulatory Visit: Payer: Self-pay | Attending: Internal Medicine

## 2014-12-04 ENCOUNTER — Ambulatory Visit: Payer: Self-pay | Attending: Internal Medicine | Admitting: Internal Medicine

## 2014-12-04 ENCOUNTER — Encounter: Payer: Self-pay | Admitting: Internal Medicine

## 2014-12-04 VITALS — BP 159/88 | HR 80 | Temp 98.0°F | Resp 16

## 2014-12-04 DIAGNOSIS — R1013 Epigastric pain: Secondary | ICD-10-CM | POA: Insufficient documentation

## 2014-12-04 DIAGNOSIS — I10 Essential (primary) hypertension: Secondary | ICD-10-CM | POA: Insufficient documentation

## 2014-12-04 DIAGNOSIS — R8290 Unspecified abnormal findings in urine: Secondary | ICD-10-CM | POA: Insufficient documentation

## 2014-12-04 DIAGNOSIS — Z0000 Encounter for general adult medical examination without abnormal findings: Secondary | ICD-10-CM

## 2014-12-04 LAB — POCT URINALYSIS DIPSTICK
BILIRUBIN UA: NEGATIVE
Glucose, UA: NEGATIVE
KETONES UA: NEGATIVE
Nitrite, UA: NEGATIVE
PH UA: 6
Protein, UA: NEGATIVE
Spec Grav, UA: 1.015
Urobilinogen, UA: 0.2

## 2014-12-04 LAB — GLUCOSE, POCT (MANUAL RESULT ENTRY): POC Glucose: 74 mg/dl (ref 70–99)

## 2014-12-04 LAB — POCT GLYCOSYLATED HEMOGLOBIN (HGB A1C): Hemoglobin A1C: 5.1

## 2014-12-04 MED ORDER — LISINOPRIL 10 MG PO TABS: 10.0000 mg | ORAL_TABLET | Freq: Every day | ORAL | Status: DC

## 2014-12-04 NOTE — Progress Notes (Signed)
Patient ID: Katelyn Hardin, female   DOB: [birth year], 44 y.o.   MRN: [id number]   Katelyn Hardin, is a 44 y.o. female  [id number]  [id number]  DOB - 05-20-1971  Chief Complaint  Patient presents with  . Abdominal Pain    epigastric        Subjective:   Katelyn Hardin is a 44 y.o. female here today for a follow up visit. Patient has past medical history significant for hypertension, fibromyalgia, chronic anemia, came in today with complains of having a "knot" in her epigastric area for the past year. Recently the area has been bothersome. Patient also complains of bilateral foot pain when she wakes up in the morning Patient requesting we check her urine because she has been noticing a strong odor when she voids. She has no fever. Patient's blood pressure is uncontrolled with current medication despite compliance. Patient has No headache, No chest pain, No abdominal pain - No Nausea, No new weakness tingling or numbness, No Cough - SOB.  Problem  Bad Odor of Urine  Epigastric Pain  Uncontrolled Hypertension    ALLERGIES: Allergies  Allergen Reactions  . Acetaminophen Other (See Comments)    "Bothers her- jittery and nausea."  . Codeine     Upset stomach  . Oxycodone-Acetaminophen     Upset stomach  . Vicodin [Hydrocodone-Acetaminophen] Nausea Only    PAST MEDICAL HISTORY: Past Medical History  Diagnosis Date  . Bacterial vaginosis   . Hypertension   . Anemia   . Fibromyalgia   . Fibroids     MEDICATIONS AT HOME: Prior to Admission medications   Medication Sig Start Date End Date Taking? Authorizing Provider  azithromycin (ZITHROMAX) 250 MG tablet Take 1 tablet (250 mg total) by mouth daily. Take first 2 tablets together, then 1 every day until finished. 06/08/14   Audelia Hives Presson, PA  cephALEXin (KEFLEX) 500 MG capsule Take 1 capsule (500 mg total) by mouth 3 (three) times daily. 03/19/14   Lind Covert, MD  cyclobenzaprine  (FLEXERIL) 5 MG tablet Take 1 tablet (5 mg total) by mouth 3 (three) times daily as needed for muscle spasms. 02/07/14   Reyne Dumas, MD  desonide (DESOWEN) 0.05 % cream Apply topically 2 (two) times daily. 07/07/14   Tresa Garter, MD  ferrous fumarate (HEMOCYTE - 106 MG FE) 325 (106 FE) MG TABS Take 1 tablet by mouth 2 (two) times daily.    Historical Provider, MD  gabapentin (NEURONTIN) 300 MG capsule Take 1 capsule (300 mg total) by mouth 3 (three) times daily. 02/07/14   Reyne Dumas, MD  hydrochlorothiazide (HYDRODIURIL) 25 MG tablet Take 1 tablet (25 mg total) by mouth daily. 08/01/14   Tresa Garter, MD  lisinopril (PRINIVIL,ZESTRIL) 10 MG tablet Take 1 tablet (10 mg total) by mouth daily. 12/04/14   Tresa Garter, MD  meclizine (ANTIVERT) 25 MG tablet Take 1 tablet (25 mg total) by mouth 3 (three) times daily as needed for dizziness. 07/07/14   Tresa Garter, MD  Multiple Vitamin (MULTIVITAMIN WITH MINERALS) TABS Take 1 tablet by mouth daily.    Historical Provider, MD  Norgestimate-Ethinyl Estradiol Triphasic 0.18/0.215/0.25 MG-25 MCG tab Take 1 tablet by mouth daily. 01/22/14   Kassie Mends, MD  pantoprazole (PROTONIX) 40 MG tablet Take 1 tablet (40 mg total) by mouth daily. 02/07/14   Reyne Dumas, MD  PARoxetine (PAXIL) 20 MG tablet Take 1 tablet (20 mg total) by mouth daily. 07/07/14 07/07/15  Mayzee Reichenbach E  Doreene Burke, MD  traMADol (ULTRAM) 50 MG tablet Take 1 tablet (50 mg total) by mouth every 6 (six) hours as needed. 02/07/14   Reyne Dumas, MD  trimethoprim-polymyxin b (POLYTRIM) ophthalmic solution Place 1 drop into the right eye every 4 (four) hours. 01/14/14   Gregor Hams, MD  Vitamin D, Ergocalciferol, (DRISDOL) 50000 UNITS CAPS capsule Take 1 capsule (50,000 Units total) by mouth every 7 (seven) days. 07/02/13   Tresa Garter, MD  zolpidem (AMBIEN) 5 MG tablet Take 1 tablet (5 mg total) by mouth at bedtime as needed for sleep. 08/01/14   Tresa Garter, MD      Objective:   Filed Vitals:   12/04/14 1231  BP: 159/88  Pulse: 80  Temp: 98 F (36.7 C)  Resp: 16  SpO2: 100%    Exam General appearance : Awake, alert, not in any distress. Speech Clear. Not toxic looking HEENT: Atraumatic and Normocephalic, pupils equally reactive to light and accomodation Neck: supple, no JVD. No cervical lymphadenopathy.  Chest:Good air entry bilaterally, no added sounds  CVS: S1 S2 regular, no murmurs.  Abdomen: Bowel sounds present, Non tender and not distended with no gaurding, rigidity or rebound. Extremities: B/L Lower Ext shows no edema, both legs are warm to touch Neurology: Awake alert, and oriented X 3, CN II-XII intact, Non focal Skin:No Rash Wounds:N/A  Data Review Lab Results  Component Value Date   HGBA1C 5.0 02/22/2013     Assessment & Plan   1. Bad odor of urine  - Urinalysis Dipstick negative for UTI  2. Epigastric pain  - CT Abdomen Pelvis Wo Contrast; Future  3. Uncontrolled hypertension Continue amlodipine 10 mg tablet by mouth daily and add lisinopril 10 mg tablet by mouth daily - lisinopril (PRINIVIL,ZESTRIL) 10 MG tablet; Take 1 tablet (10 mg total) by mouth daily.  Dispense: 90 tablet; Refill: 3 - DASH Diet emphasized - We discussed blood pressure goal, patient is to return to clinic in 2 weeks with blood pressure logs for blood pressure check and review of medications  Patient was counseled extensively about nutrition and exercise.   Return in about 3 months (around 03/05/2015) for Follow up HTN, Follow up Pain and comorbidities.  The patient was given clear instructions to go to ER or return to medical center if symptoms don't improve, worsen or new problems develop. The patient verbalized understanding. The patient was told to call to get lab results if they haven't heard anything in the next week.   This note has been created with Surveyor, quantity. Any  transcriptional errors are unintentional.    Angelica Chessman, MD, [place], [place], [place] and [place], [place]   12/04/2014, 1:04 PM

## 2014-12-04 NOTE — Progress Notes (Signed)
Patient complains of having a "knot" in her epigastric area for the past year Recently the area has been bothersome Patient also complains of bilateral foot pain when she wakes up in the morning Patient requesting we check her urine because she has been noticing a strong odor when she voids

## 2014-12-05 ENCOUNTER — Telehealth: Payer: Self-pay | Admitting: *Deleted

## 2014-12-05 NOTE — Telephone Encounter (Signed)
Ct appointment on Dec 09 2014 at 09:00 arriving 15 min early. NPO at [place] up contrast Pt aware of appointment, notified need to pick up contrast

## 2014-12-09 ENCOUNTER — Ambulatory Visit (HOSPITAL_COMMUNITY)
Admission: RE | Admit: 2014-12-09 | Discharge: 2014-12-09 | Disposition: A | Discharge status: Home / Self Care | Payer: Self-pay | Source: Ambulatory Visit | Attending: Internal Medicine | Admitting: Internal Medicine

## 2014-12-09 DIAGNOSIS — K429 Umbilical hernia without obstruction or gangrene: Secondary | ICD-10-CM | POA: Insufficient documentation

## 2014-12-09 DIAGNOSIS — N200 Calculus of kidney: Secondary | ICD-10-CM | POA: Insufficient documentation

## 2014-12-09 DIAGNOSIS — R1013 Epigastric pain: Secondary | ICD-10-CM

## 2014-12-09 DIAGNOSIS — D259 Leiomyoma of uterus, unspecified: Secondary | ICD-10-CM | POA: Insufficient documentation

## 2014-12-10 ENCOUNTER — Ambulatory Visit: Payer: Self-pay | Attending: Internal Medicine

## 2014-12-10 DIAGNOSIS — Z0000 Encounter for general adult medical examination without abnormal findings: Secondary | ICD-10-CM

## 2014-12-10 NOTE — Progress Notes (Unsigned)
Pt is here to be tested for PPD.

## 2014-12-11 ENCOUNTER — Telehealth: Payer: Self-pay | Admitting: Internal Medicine

## 2014-12-11 NOTE — Telephone Encounter (Signed)
Pt calling to speak to CMA. Please f/u with pt.

## 2014-12-15 NOTE — Telephone Encounter (Signed)
Pt stated has question about PPD reading Pt was advised to retest in 7 days since pt was unable to have test read.

## 2014-12-22 ENCOUNTER — Ambulatory Visit: Payer: Self-pay | Attending: Internal Medicine | Admitting: *Deleted

## 2014-12-22 DIAGNOSIS — Z0000 Encounter for general adult medical examination without abnormal findings: Secondary | ICD-10-CM

## 2014-12-22 DIAGNOSIS — Z111 Encounter for screening for respiratory tuberculosis: Secondary | ICD-10-CM | POA: Insufficient documentation

## 2014-12-22 NOTE — Progress Notes (Signed)
Patient presents for PPD placement for work Patient aware that she needs to return in 57 hours for PPD reading

## 2014-12-24 ENCOUNTER — Ambulatory Visit (HOSPITAL_COMMUNITY)
Admission: RE | Admit: 2014-12-24 | Discharge: 2014-12-24 | Disposition: A | Discharge status: Home / Self Care | Payer: No Typology Code available for payment source | Source: Ambulatory Visit | Attending: Internal Medicine | Admitting: Internal Medicine

## 2014-12-24 ENCOUNTER — Ambulatory Visit: Payer: Self-pay | Attending: Internal Medicine | Admitting: *Deleted

## 2014-12-24 DIAGNOSIS — R7611 Nonspecific reaction to tuberculin skin test without active tuberculosis: Secondary | ICD-10-CM

## 2014-12-24 DIAGNOSIS — I10 Essential (primary) hypertension: Secondary | ICD-10-CM | POA: Insufficient documentation

## 2014-12-25 ENCOUNTER — Telehealth: Payer: Self-pay | Admitting: Internal Medicine

## 2014-12-25 ENCOUNTER — Telehealth: Payer: Self-pay | Admitting: Emergency Medicine

## 2014-12-25 NOTE — Telephone Encounter (Signed)
Patient called to request her results her X-Ray, please f/u with pt.

## 2014-12-25 NOTE — Telephone Encounter (Signed)
Patient also would like to get Scat scan results. Please f/u with pt.

## 2014-12-25 NOTE — Telephone Encounter (Signed)
Left message with xray/CT scan results No treatment needed at this time

## 2014-12-26 ENCOUNTER — Other Ambulatory Visit: Payer: Self-pay

## 2014-12-26 LAB — TB SKIN TEST: TB Skin Test: POSITIVE

## 2014-12-29 LAB — QUANTIFERON TB GOLD ASSAY (BLOOD)
Interferon Gamma Release Assay: NEGATIVE
MITOGEN VALUE: 1.58 [IU]/mL
QUANTIFERON TB AG MINUS NIL: 0 [IU]/mL
Quantiferon Nil Value: 0.03 IU/mL
TB Ag value: 0.03 IU/mL

## 2015-01-01 ENCOUNTER — Telehealth: Payer: Self-pay | Admitting: *Deleted

## 2015-01-01 NOTE — Telephone Encounter (Signed)
Patient aware of results. Patient will pick up letter with results.

## 2015-01-01 NOTE — Telephone Encounter (Signed)
-----   Message from Tresa Garter, MD sent at 12/31/2014  5:48 PM EST ----- Please inform patient that her interferon test for tuberculosis is negative.

## 2015-01-02 ENCOUNTER — Telehealth: Payer: Self-pay | Admitting: Internal Medicine

## 2015-01-02 NOTE — Telephone Encounter (Signed)
Pt came in today and picked up her paper work.

## 2015-03-24 ENCOUNTER — Ambulatory Visit: Payer: No Typology Code available for payment source

## 2015-03-24 ENCOUNTER — Ambulatory Visit: Payer: No Typology Code available for payment source | Attending: Internal Medicine | Admitting: Family Medicine

## 2015-03-24 VITALS — BP 142/94 | HR 77 | Temp 98.0°F | Wt 191.0 lb

## 2015-03-24 DIAGNOSIS — R350 Frequency of micturition: Secondary | ICD-10-CM

## 2015-03-24 LAB — POCT URINALYSIS DIPSTICK
GLUCOSE UA: NEGATIVE
KETONES UA: NEGATIVE
NITRITE UA: POSITIVE
PH UA: 6
Protein, UA: 30
Urobilinogen, UA: 2

## 2015-03-24 MED ORDER — SULFAMETHOXAZOLE-TRIMETHOPRIM 800-160 MG PO TABS: 1.0000 | ORAL_TABLET | Freq: Two times a day (BID) | ORAL | Status: DC

## 2015-03-24 NOTE — Progress Notes (Signed)
Subjective:     Patient ID: Katelyn Hardin, female   DOB: Feb 13, 1971, 44 y.o.   MRN: [id number]  Urinary Tract Infection      Patient presents with a 2 day history of UTI symptoms. She c/o urgency, frequency, strong odor Denies pain or blood.She has not been bringing sufficient water by self report. She has had an occassional UTI in the past but not a significant problem. She has not had a menstural period since late January. It is not unusual for her to have very irregular periods. She has had a tubal ligation.Patient has hypertension but has not taken her medication today. BP today is 142/94   Review of Systems   General: Denies fever, chills, change in appetite, wt loss.  Lungs: Denies cough, shortness of breath, increased respiratory effor Heart:  Denies chest pain or palpitations GI:  Admits to intermittent abd pain related to IBS and uterine fibroids.     Objective:   Physical Exam   General: She is alert, oriented, appropriate, in no distress Skin:  Warm and dry. Lungs  Clear to auscultation, no increase in respiratory effort. Adb:  No abd or suprapubic tenderness. There is no CVA tenderness GU:  Urine is positive for a trace of blood, small leukocytes and positive for nitrates.      Assessment and plan:   UTI Septra DS #10, one po bid for 5 days Increased water. Urine culture to confirm infection and correct antibiotic.  Hypertension  A reminder to take her BP medication daily and the importance of keeping her BP under good control.     Micheline Chapman, [place], [facility] and Wellness [phone]

## 2015-03-24 NOTE — Progress Notes (Signed)
Subjective:     Patient ID: Katelyn Hardin, female   DOB: [birth year], 44 y.o.   MRN: [id number]  HPI   Patient presents with a 2 day history of UTI symptoms. She c/o urgency, frequency, strong odor Denies pain or blood.She has not been bringing sufficient water by self report. She has had an occassional UTI in the past but not a significant problem. She has not had a menstural period since late January. It is not unusual for her to have very irregular periods. She has had a tubal ligation.Patient has hypertension but has not taken her medication today. BP today is 142/94   Review of Systems   General: Denies fever, chills, change in appetite, wt loss.  Lungs: Denies cough, shortness of breath, increased respiratory effor Heart:  Denies chest pain or palpitations GI:  Admits to intermittent abd pain related to IBS and uterine fibroids.     Objective:   Physical Exam   General: She is alert, oriented, appropriate, in no distress Skin:  Warm and dry. Lungs  Clear to auscultation, no increase in respiratory effort. Adb:  No abd or suprapubic tenderness. There is no CVA tenderness GU:  Urine is positive for a trace of blood, small leukocytes and positive for nitrates.      Assessment and plan:   UTI Septra DS #10, one po bid for 5 days Increased water. Urine culture to confirm infection and correct antibiotic.  Hypertension  A reminder to take her BP medication daily and the importance of keeping her BP under good control.     Micheline Chapman, [place], [facility] and Wellness [phone]

## 2015-03-24 NOTE — Patient Instructions (Signed)
1. Lots of water.  2. Take medication for 5 days. 3. We will call with the results of the urine culture

## 2015-03-26 LAB — URINE CULTURE: Colony Count: 70000

## 2015-04-28 ENCOUNTER — Other Ambulatory Visit: Payer: Self-pay | Admitting: Internal Medicine

## 2015-06-04 ENCOUNTER — Encounter: Payer: Self-pay | Admitting: Internal Medicine

## 2015-06-04 ENCOUNTER — Ambulatory Visit: Payer: No Typology Code available for payment source | Attending: Internal Medicine | Admitting: Internal Medicine

## 2015-06-04 VITALS — BP 138/90 | HR 66 | Temp 98.0°F | Resp 18 | Ht 66.0 in | Wt 190.4 lb

## 2015-06-04 DIAGNOSIS — M797 Fibromyalgia: Secondary | ICD-10-CM | POA: Insufficient documentation

## 2015-06-04 DIAGNOSIS — M5430 Sciatica, unspecified side: Secondary | ICD-10-CM | POA: Insufficient documentation

## 2015-06-04 DIAGNOSIS — M549 Dorsalgia, unspecified: Secondary | ICD-10-CM | POA: Insufficient documentation

## 2015-06-04 DIAGNOSIS — E669 Obesity, unspecified: Secondary | ICD-10-CM

## 2015-06-04 DIAGNOSIS — Z780 Asymptomatic menopausal state: Secondary | ICD-10-CM | POA: Insufficient documentation

## 2015-06-04 DIAGNOSIS — Z79899 Other long term (current) drug therapy: Secondary | ICD-10-CM | POA: Insufficient documentation

## 2015-06-04 DIAGNOSIS — N342 Other urethritis: Secondary | ICD-10-CM | POA: Insufficient documentation

## 2015-06-04 DIAGNOSIS — N951 Menopausal and female climacteric states: Secondary | ICD-10-CM

## 2015-06-04 DIAGNOSIS — H8113 Benign paroxysmal vertigo, bilateral: Secondary | ICD-10-CM

## 2015-06-04 DIAGNOSIS — H8110 Benign paroxysmal vertigo, unspecified ear: Secondary | ICD-10-CM | POA: Insufficient documentation

## 2015-06-04 DIAGNOSIS — I10 Essential (primary) hypertension: Secondary | ICD-10-CM | POA: Insufficient documentation

## 2015-06-04 LAB — POCT URINALYSIS DIPSTICK
Glucose, UA: NEGATIVE
Ketones, UA: NEGATIVE
Leukocytes, UA: NEGATIVE
Nitrite, UA: NEGATIVE
RBC UA: NEGATIVE
Spec Grav, UA: 1.02
Urobilinogen, UA: 2
pH, UA: 7

## 2015-06-04 MED ORDER — PAROXETINE HCL 20 MG PO TABS: 20.0000 mg | ORAL_TABLET | Freq: Every day | ORAL | Status: DC

## 2015-06-04 MED ORDER — DESONIDE 0.05 % EX CREA: TOPICAL_CREAM | Freq: Two times a day (BID) | CUTANEOUS | Status: DC

## 2015-06-04 MED ORDER — PHENTERMINE HCL 37.5 MG PO CAPS: 37.5000 mg | ORAL_CAPSULE | ORAL | Status: DC

## 2015-06-04 MED ORDER — MECLIZINE HCL 25 MG PO TABS: 25.0000 mg | ORAL_TABLET | Freq: Three times a day (TID) | ORAL | Status: DC | PRN

## 2015-06-04 MED ORDER — HYDROCHLOROTHIAZIDE 25 MG PO TABS: 25.0000 mg | ORAL_TABLET | Freq: Every day | ORAL | Status: DC

## 2015-06-04 MED ORDER — LISINOPRIL 10 MG PO TABS: 10.0000 mg | ORAL_TABLET | Freq: Every day | ORAL | Status: DC

## 2015-06-04 NOTE — Progress Notes (Signed)
Patient ID: Katelyn Hardin, female   DOB: [birth year], 44 y.o.   MRN: [id number]   Katelyn Hardin, is a 44 y.o. female  [id number]  [id number]  DOB - [birth year]  Chief Complaint  Patient presents with  . Sciatica        Subjective:   Katelyn Hardin is a 44 y.o. female here today for a follow up visit. Pleasant lady with medical history significant for hypertension, fibromyalgia, chronic anemia and obesity. She is requesting for medication to reduce her appetite because she is being eaten a lot more lately and she is guarding so much weight which in part is affecting her knees. Patient also complaining of pain in the back radiating to her legs that is getting worse lately. Pain is worse with walking and occasionally associated with tingling in her foot. Patient also needs medication refills. She complained of gaining so much weight. Patient has No headache, No chest pain, No abdominal pain - No Nausea, No new weakness tingling or numbness, No Cough - SOB.  Problem  Infective Urethritis    ALLERGIES: Allergies  Allergen Reactions  . Acetaminophen Other (See Comments)    "Bothers her- jittery and nausea."  . Codeine     Upset stomach  . Oxycodone-Acetaminophen     Upset stomach  . Vicodin [Hydrocodone-Acetaminophen] Nausea Only    PAST MEDICAL HISTORY: Past Medical History  Diagnosis Date  . Bacterial vaginosis   . Hypertension   . Anemia   . Fibromyalgia   . Fibroids     MEDICATIONS AT HOME: Prior to Admission medications   Medication Sig Start Date End Date Taking? Authorizing Provider  hydrochlorothiazide (HYDRODIURIL) 25 MG tablet Take 1 tablet (25 mg total) by mouth daily. 06/04/15  Yes Tresa Garter, MD  lisinopril (PRINIVIL,ZESTRIL) 10 MG tablet Take 1 tablet (10 mg total) by mouth daily. 06/04/15  Yes Tresa Garter, MD  Multiple Vitamin (MULTIVITAMIN WITH MINERALS) TABS Take 1 tablet by mouth daily.   Yes Historical  Provider, MD  desonide (DESOWEN) 0.05 % cream Apply topically 2 (two) times daily. 06/04/15   Tresa Garter, MD  ferrous fumarate (HEMOCYTE - 106 MG FE) 325 (106 FE) MG TABS Take 1 tablet by mouth 2 (two) times daily.    Historical Provider, MD  gabapentin (NEURONTIN) 300 MG capsule Take 1 capsule (300 mg total) by mouth 3 (three) times daily. Patient not taking: Reported on 06/04/2015 02/07/14   Reyne Dumas, MD  meclizine (ANTIVERT) 25 MG tablet Take 1 tablet (25 mg total) by mouth 3 (three) times daily as needed for dizziness. 06/04/15   Tresa Garter, MD  Norgestimate-Ethinyl Estradiol Triphasic 0.18/0.215/0.25 MG-25 MCG tab Take 1 tablet by mouth daily. Patient not taking: Reported on 06/04/2015 01/22/14   Kassie Mends, MD  pantoprazole (PROTONIX) 40 MG tablet Take 1 tablet (40 mg total) by mouth daily. Patient not taking: Reported on 06/04/2015 02/07/14   Reyne Dumas, MD  PARoxetine (PAXIL) 20 MG tablet Take 1 tablet (20 mg total) by mouth daily. 06/04/15 06/03/16  Tresa Garter, MD  phentermine 37.5 MG capsule Take 1 capsule (37.5 mg total) by mouth every morning. 06/04/15   Tresa Garter, MD  sulfamethoxazole-trimethoprim (BACTRIM DS,SEPTRA DS) 800-160 MG per tablet Take 1 tablet by mouth 2 (two) times daily. Patient not taking: Reported on 06/04/2015 03/24/15   Micheline Chapman, NP  Vitamin D, Ergocalciferol, (DRISDOL) 50000 UNITS CAPS capsule Take 1 capsule (50,000 Units total) by mouth every 7 (seven)  days. Patient not taking: Reported on 06/04/2015 07/02/13   Tresa Garter, MD  zolpidem (AMBIEN) 5 MG tablet Take 1 tablet (5 mg total) by mouth at bedtime as needed for sleep. Patient not taking: Reported on 06/04/2015 08/01/14   Tresa Garter, MD     Objective:   Filed Vitals:   06/04/15 1110  BP: 138/90  Pulse: 66  Temp: 98 F (36.7 C)  TempSrc: Oral  Resp: 18  Height: 5\' 6"  (1.676 m)  Weight: 190 lb 6.4 oz (86.365 kg)  SpO2: 100%    Exam General  appearance : Awake, alert, not in any distress. Speech Clear. Not toxic looking, obese HEENT: Atraumatic and Normocephalic, pupils equally reactive to light and accomodation Neck: supple, no JVD. No cervical lymphadenopathy.  Chest:Good air entry bilaterally, no added sounds  CVS: S1 S2 regular, no murmurs.  Abdomen: Bowel sounds present, Non tender and not distended with no gaurding, rigidity or rebound. Extremities: B/L Lower Ext shows no edema, both legs are warm to touch Neurology: Awake alert, and oriented X 3, CN II-XII intact, Non focal  Data Review Lab Results  Component Value Date   HGBA1C 5.10 12/04/2014   HGBA1C 5.0 02/22/2013     Assessment & Plan   1. Infective urethritis  - Urinalysis Dipstick  2. Essential hypertension, benign  - lisinopril (PRINIVIL,ZESTRIL) 10 MG tablet; Take 1 tablet (10 mg total) by mouth daily.  Dispense: 90 tablet; Refill: 3 - hydrochlorothiazide (HYDRODIURIL) 25 MG tablet; Take 1 tablet (25 mg total) by mouth daily.  Dispense: 90 tablet; Refill: 3  - We have discussed target BP range and blood pressure goal - I have advised patient to check BP regularly and to call us back or report to clinic if the numbers are consistently higher than 140/90  - We discussed the importance of compliance with medical therapy and DASH diet recommended, consequences of uncontrolled hypertension discussed.  - continue current BP medications   3. Sciatica neuralgia, unspecified laterality  Physical Therapy Continue pain medications  4. Benign paroxysmal positional vertigo, bilateral  - meclizine (ANTIVERT) 25 MG tablet; Take 1 tablet (25 mg total) by mouth 3 (three) times daily as needed for dizziness.  Dispense: 60 tablet; Refill: 0  5. Menopausal hot flushes  - PARoxetine (PAXIL) 20 MG tablet; Take 1 tablet (20 mg total) by mouth daily.  Dispense: 30 tablet; Refill: 2  6. Obesity  - phentermine 37.5 MG capsule; Take 1 capsule (37.5 mg total) by  mouth every morning.  Dispense: 30 capsule; Refill: 3   Aim for 30 minutes of exercise most days. Rethink what you drink. Water is great! Aim for 2-3 Carb Choices per meal (30-45 grams) +/- 1 either way  Aim for 0-15 Carbs per snack if hungry  Include protein in moderation with your meals and snacks  Consider reading food labels for Total Carbohydrate and Fat Grams of foods  Be mindful about how much sugar you are adding to beverages and other foods. Try to decrease. Consider splenda. Fruit Punch - find one with no sugar  Measure and decrease portions of carbohydrate foods  Make your plate and don't go back for seconds  Patient have been counseled extensively about nutrition and exercise  Return in about 3 months (around 09/04/2015) for Follow up HTN, Follow up Pain and comorbidities.  The patient was given clear instructions to go to ER or return to medical center if symptoms don't improve, worsen or new problems develop. The patient  verbalized understanding. The patient was told to call to get lab results if they haven't heard anything in the next week.   This note has been created with Surveyor, quantity. Any transcriptional errors are unintentional.    Angelica Chessman, MD, [place], [place], [place], [place] and [place], [place]   06/04/2015, 12:19 PM

## 2015-06-04 NOTE — Patient Instructions (Addendum)
DASH Eating Plan DASH stands for "Dietary Approaches to Stop Hypertension." The DASH eating plan is a healthy eating plan that has been shown to reduce high blood pressure (hypertension). Additional health benefits may include reducing the risk of type 2 diabetes mellitus, heart disease, and stroke. The DASH eating plan may also help with weight loss. WHAT DO I NEED TO KNOW ABOUT THE DASH EATING PLAN? For the DASH eating plan, you will follow these general guidelines:  Choose foods with a percent daily value for sodium of less than 5% (as listed on the food label).  Use salt-free seasonings or herbs instead of table salt or sea salt.  Check with your health care provider or pharmacist before using salt substitutes.  Eat lower-sodium products, often labeled as "lower sodium" or "no salt added."  Eat fresh foods.  Eat more vegetables, fruits, and low-fat dairy products.  Choose whole grains. Look for the word "whole" as the first word in the ingredient list.  Choose fish and skinless chicken or turkey more often than red meat. Limit fish, poultry, and meat to 6 oz (170 g) each day.  Limit sweets, desserts, sugars, and sugary drinks.  Choose heart-healthy fats.  Limit cheese to 1 oz (28 g) per day.  Eat more home-cooked food and less restaurant, buffet, and fast food.  Limit fried foods.  Cook foods using methods other than frying.  Limit canned vegetables. If you do use them, rinse them well to decrease the sodium.  When eating at a restaurant, ask that your food be prepared with less salt, or no salt if possible. WHAT FOODS CAN I EAT? Seek help from a dietitian for individual calorie needs. Grains Whole grain or whole wheat bread. Brown rice. Whole grain or whole wheat pasta. Quinoa, bulgur, and whole grain cereals. Low-sodium cereals. Corn or whole wheat flour tortillas. Whole grain cornbread. Whole grain crackers. Low-sodium crackers. Vegetables Fresh or frozen vegetables  (raw, steamed, roasted, or grilled). Low-sodium or reduced-sodium tomato and vegetable juices. Low-sodium or reduced-sodium tomato sauce and paste. Low-sodium or reduced-sodium canned vegetables.  Fruits All fresh, canned (in natural juice), or frozen fruits. Meat and Other Protein Products Ground beef (85% or leaner), grass-fed beef, or beef trimmed of fat. Skinless chicken or turkey. Ground chicken or turkey. Pork trimmed of fat. All fish and seafood. Eggs. Dried beans, peas, or lentils. Unsalted nuts and seeds. Unsalted canned beans. Dairy Low-fat dairy products, such as skim or 1% milk, 2% or reduced-fat cheeses, low-fat ricotta or cottage cheese, or plain low-fat yogurt. Low-sodium or reduced-sodium cheeses. Fats and Oils Tub margarines without trans fats. Light or reduced-fat mayonnaise and salad dressings (reduced sodium). Avocado. Safflower, olive, or canola oils. Natural peanut or almond butter. Other Unsalted popcorn and pretzels. The items listed above may not be a complete list of recommended foods or beverages. Contact your dietitian for more options. WHAT FOODS ARE NOT RECOMMENDED? Grains White bread. White pasta. White rice. Refined cornbread. Bagels and croissants. Crackers that contain trans fat. Vegetables Creamed or fried vegetables. Vegetables in a cheese sauce. Regular canned vegetables. Regular canned tomato sauce and paste. Regular tomato and vegetable juices. Fruits Dried fruits. Canned fruit in light or heavy syrup. Fruit juice. Meat and Other Protein Products Fatty cuts of meat. Ribs, chicken wings, bacon, sausage, bologna, salami, chitterlings, fatback, hot dogs, bratwurst, and packaged luncheon meats. Salted nuts and seeds. Canned beans with salt. Dairy Whole or 2% milk, cream, half-and-half, and cream cheese. Whole-fat or sweetened yogurt. Full-fat   cheeses or blue cheese. Nondairy creamers and whipped toppings. Processed cheese, cheese spreads, or cheese  curds. Condiments Onion and garlic salt, seasoned salt, table salt, and sea salt. Canned and packaged gravies. Worcestershire sauce. Tartar sauce. Barbecue sauce. Teriyaki sauce. Soy sauce, including reduced sodium. Steak sauce. Fish sauce. Oyster sauce. Cocktail sauce. Horseradish. Ketchup and mustard. Meat flavorings and tenderizers. Bouillon cubes. Hot sauce. Tabasco sauce. Marinades. Taco seasonings. Relishes. Fats and Oils Butter, stick margarine, lard, shortening, ghee, and bacon fat. Coconut, palm kernel, or palm oils. Regular salad dressings. Other Pickles and olives. Salted popcorn and pretzels. The items listed above may not be a complete list of foods and beverages to avoid. Contact your dietitian for more information. WHERE CAN I FIND MORE INFORMATION? National Heart, Lung, and Blood Institute: travelstabloid.com Document Released: 10/27/2011 Document Revised: 03/24/2014 Document Reviewed: 09/11/2013 [facility] Patient Information 2015 [place], [place]. This information is not intended to replace advice given to you by your health care provider. Make sure you discuss any questions you have with your health care provider. Hypertension Hypertension, commonly called high blood pressure, is when the force of blood pumping through your arteries is too strong. Your arteries are the blood vessels that carry blood from your heart throughout your body. A blood pressure reading consists of a higher number over a lower number, such as 110/72. The higher number (systolic) is the pressure inside your arteries when your heart pumps. The lower number (diastolic) is the pressure inside your arteries when your heart relaxes. Ideally you want your blood pressure below 120/80. Hypertension forces your heart to work harder to pump blood. Your arteries may become narrow or stiff. Having hypertension puts you at risk for heart disease, stroke, and other problems.  RISK  FACTORS Some risk factors for high blood pressure are controllable. Others are not.  Risk factors you cannot control include:   Race. You may be at higher risk if you are African American.  Age. Risk increases with age.  Gender. Men are at higher risk than women before age 37 years. After age 55, women are at higher risk than men. Risk factors you can control include:  Not getting enough exercise or physical activity.  Being overweight.  Getting too much fat, sugar, calories, or salt in your diet.  Drinking too much alcohol. SIGNS AND SYMPTOMS Hypertension does not usually cause signs or symptoms. Extremely high blood pressure (hypertensive crisis) may cause headache, anxiety, shortness of breath, and nosebleed. DIAGNOSIS  To check if you have hypertension, your health care provider will measure your blood pressure while you are seated, with your arm held at the level of your heart. It should be measured at least twice using the same arm. Certain conditions can cause a difference in blood pressure between your right and left arms. A blood pressure reading that is higher than normal on one occasion does not mean that you need treatment. If one blood pressure reading is high, ask your health care provider about having it checked again. TREATMENT  Treating high blood pressure includes making lifestyle changes and possibly taking medicine. Living a healthy lifestyle can help lower high blood pressure. You may need to change some of your habits. Lifestyle changes may include:  Following the DASH diet. This diet is high in fruits, vegetables, and whole grains. It is low in salt, red meat, and added sugars.  Getting at least 2 hours of brisk physical activity every week.  Losing weight if necessary.  Not smoking.  Limiting  alcoholic beverages.  Learning ways to reduce stress. If lifestyle changes are not enough to get your blood pressure under control, your health care provider may  prescribe medicine. You may need to take more than one. Work closely with your health care provider to understand the risks and benefits. HOME CARE INSTRUCTIONS  Have your blood pressure rechecked as directed by your health care provider.   Take medicines only as directed by your health care provider. Follow the directions carefully. Blood pressure medicines must be taken as prescribed. The medicine does not work as well when you skip doses. Skipping doses also puts you at risk for problems.   Do not smoke.   Monitor your blood pressure at home as directed by your health care provider. SEEK MEDICAL CARE IF:   You think you are having a reaction to medicines taken.  You have recurrent headaches or feel dizzy.  You have swelling in your ankles.  You have trouble with your vision. SEEK IMMEDIATE MEDICAL CARE IF:  You develop a severe headache or confusion.  You have unusual weakness, numbness, or feel faint.  You have severe chest or abdominal pain.  You vomit repeatedly.  You have trouble breathing. MAKE SURE YOU:   Understand these instructions.  Will watch your condition.  Will get help right away if you are not doing well or get worse. Document Released: 11/07/2005 Document Revised: 03/24/2014 Document Reviewed: 08/30/2013 [facility] Patient Information 2015 [place], [place]. This information is not intended to replace advice given to you by your health care provider. Make sure you discuss any questions you have with your health care provider. Obesity Obesity is defined as having too much total body fat and a body mass index (BMI) of 30 or more. BMI is an estimate of body fat and is calculated from your height and weight. Obesity happens when you consume more calories than you can burn by exercising or performing daily physical tasks. Prolonged obesity can cause major illnesses or emergencies, such as:   Stroke.  Heart  disease.  Diabetes.  Cancer.  Arthritis.  High blood pressure (hypertension).  High cholesterol.  Sleep apnea.  Erectile dysfunction.  Infertility problems. CAUSES   Regularly eating unhealthy foods.  Physical inactivity.  Certain disorders, such as an underactive thyroid (hypothyroidism), Cushing's syndrome, and polycystic ovarian syndrome.  Certain medicines, such as steroids, some depression medicines, and antipsychotics.  Genetics.  Lack of sleep. DIAGNOSIS  A health care provider can diagnose obesity after calculating your BMI. Obesity will be diagnosed if your BMI is 30 or higher.  There are other methods of measuring obesity levels. Some other methods include measuring your skinfold thickness, your waist circumference, and comparing your hip circumference to your waist circumference. TREATMENT  A healthy treatment program includes some or all of the following:  Long-term dietary changes.  Exercise and physical activity.  Behavioral and lifestyle changes.  Medicine only under the supervision of your health care provider. Medicines may help, but only if they are used with diet and exercise programs. An unhealthy treatment program includes:  Fasting.  Fad diets.  Supplements and drugs. These choices do not succeed in long-term weight control.  HOME CARE INSTRUCTIONS   Exercise and perform physical activity as directed by your health care provider. To increase physical activity, try the following:  Use stairs instead of elevators.  Park farther away from store entrances.  Garden, bike, or walk instead of watching television or using the computer.  Eat healthy, low-calorie foods and  drinks on a regular basis. Eat more fruits and vegetables. Use low-calorie cookbooks or take healthy cooking classes.  Limit fast food, sweets, and processed snack foods.  Eat smaller portions.  Keep a daily journal of everything you eat. There are many free websites to  help you with this. It may be helpful to measure your foods so you can determine if you are eating the correct portion sizes.  Avoid drinking alcohol. Drink more water and drinks without calories.  Take vitamins and supplements only as recommended by your health care provider.  Weight-loss support groups, Tax adviser, counselors, and stress reduction education can also be very helpful. SEEK IMMEDIATE MEDICAL CARE IF:  You have chest pain or tightness.  You have trouble breathing or feel short of breath.  You have weakness or leg numbness.  You feel confused or have trouble talking.  You have sudden changes in your vision. MAKE SURE YOU:  Understand these instructions.  Will watch your condition.  Will get help right away if you are not doing well or get worse. Document Released: 12/15/2004 Document Revised: 03/24/2014 Document Reviewed: 12/14/2011 [facility] Patient Information 2015 [place], [place]. This information is not intended to replace advice given to you by your health care provider. Make sure you discuss any questions you have with your health care provider.

## 2015-06-04 NOTE — Progress Notes (Signed)
Patient here for sciatica and pain. Patient denies pain at this moment, just discomfort. Patient reports here siatica is getting worse. Patient reports its starting to bother her throught the day which it usually only bothered her at night. Patients left foot has been hurting her very bad. Patient reports it hurts to walk on it. Patient reports it is always hurting.  Patient stated that two weeks ago she had extensive tingling in her foot that she never experienced before.   Patient reports not sleeping well. Patient states that she did not feel that ambien helped her much because she could fall asleep but not stay asleep.   Patient needs something equivalent to [place] because pharmacy doesn't have it. Patient needs refill on antivert and drisdol. Patient also requests something to curve her appetite.   Patient would like to have a urine culture taken to check to see if she has a UTI due to her urine being very concentrated.

## 2015-06-19 ENCOUNTER — Ambulatory Visit: Payer: No Typology Code available for payment source

## 2015-07-10 ENCOUNTER — Other Ambulatory Visit: Payer: Self-pay | Admitting: Internal Medicine

## 2015-12-02 ENCOUNTER — Telehealth: Payer: Self-pay | Admitting: Internal Medicine

## 2015-12-02 NOTE — Telephone Encounter (Signed)
Medical Assistant left message on patient's home and cell voicemail. Voicemail states to give a call back to [place] with [facility] at [phone].   Please inform the patient of needing to schedule an appointment to be assessed for her concern.

## 2015-12-02 NOTE — Telephone Encounter (Signed)
That would be at the discretion of the provider. The patient would need a lab appointment scheduled for the urine check. I will forward to Dr. Doreene Burke and upon his return, I will contact the patient with his recommendation.

## 2015-12-02 NOTE — Telephone Encounter (Signed)
Pt. Called stating that she has a UTI and would like to be prescribed a medication for it. Please f/u with pt.

## 2015-12-02 NOTE — Telephone Encounter (Signed)
Pt. Called requesting an appt. Pt. Was informed that her PCP does not have anything available until February. Pt. Stated that last time she got a UTI she came in and dropped off a urine sample and was prescribed medication for it with out being seen by a provider. Pt. Would like to know if she can do that again. Please f/u eith opt.

## 2015-12-03 ENCOUNTER — Telehealth: Payer: Self-pay | Admitting: Internal Medicine

## 2015-12-03 NOTE — Telephone Encounter (Signed)
Patient called, stating that she may have a UTI, and would like to know if she can leave a urine sample, to receive medicine for UTI. Patient stated that the provider is booked out for a approximately three weeks and the UTI is causing pain. Please follow up with patient.

## 2015-12-03 NOTE — Telephone Encounter (Signed)
Denise call patient regarding this last note by [place] and [place]. If we have a opening you may place her in only for UTI symptoms. If not send her to the urgent care

## 2015-12-04 ENCOUNTER — Telehealth: Payer: Self-pay

## 2015-12-04 ENCOUNTER — Ambulatory Visit: Payer: Self-pay | Attending: Internal Medicine

## 2015-12-04 VITALS — BP 112/76 | HR 72 | Temp 97.9°F | Resp 18 | Ht 66.0 in | Wt 186.0 lb

## 2015-12-04 DIAGNOSIS — R309 Painful micturition, unspecified: Secondary | ICD-10-CM

## 2015-12-04 DIAGNOSIS — N3000 Acute cystitis without hematuria: Secondary | ICD-10-CM | POA: Insufficient documentation

## 2015-12-04 LAB — POCT URINALYSIS DIPSTICK
Bilirubin, UA: NEGATIVE
GLUCOSE UA: NEGATIVE
KETONES UA: NEGATIVE
Nitrite, UA: POSITIVE
PH UA: 5.5
PROTEIN UA: NEGATIVE
SPEC GRAV UA: 1.025
Urobilinogen, UA: 1

## 2015-12-04 MED ORDER — CIPROFLOXACIN HCL 250 MG PO TABS: 250.0000 mg | ORAL_TABLET | Freq: Two times a day (BID) | ORAL | Status: DC

## 2015-12-04 NOTE — Progress Notes (Signed)
Patient reports urine frequency but urinating very little. It hurts at the end and has foul odor. Symptoms started earlier this week, unsure of day. Patient denies fever.  Patient feels safe at home, denies feeling sad or depressed, denies any thoughts of hurting self or others.

## 2015-12-04 NOTE — Telephone Encounter (Signed)
Returned call to patient this am  Patient is having UTI symptoms Patient is aware she can be placed on the nurses schedule Appointment given for 10:30 am today

## 2015-12-06 LAB — URINE CULTURE: Colony Count: >=100000

## 2015-12-07 ENCOUNTER — Telehealth: Payer: Self-pay

## 2015-12-07 NOTE — Telephone Encounter (Signed)
-----   Message from Lance Bosch, NP sent at 12/07/2015  1:49 PM EST ----- Patient was on correct antibiotic, does she have improvement in symptoms?

## 2015-12-07 NOTE — Telephone Encounter (Signed)
Spoke with patient and she is feeling really good

## 2015-12-11 ENCOUNTER — Telehealth: Payer: Self-pay | Admitting: Internal Medicine

## 2015-12-11 NOTE — Telephone Encounter (Signed)
Patient called stating that she was given medicine for a UTI, however the last dosage fell in the toilet. Patient would like to know if she can be prescribed more medicine. Patient would like medicine as soon as possible.

## 2015-12-14 NOTE — Telephone Encounter (Signed)
Patient came into office requesting to speak to nurse regarding medication refill. Please f/u

## 2015-12-15 ENCOUNTER — Other Ambulatory Visit: Payer: Self-pay | Admitting: *Deleted

## 2015-12-15 DIAGNOSIS — N3000 Acute cystitis without hematuria: Secondary | ICD-10-CM

## 2015-12-18 NOTE — Telephone Encounter (Signed)
Patient called stating that she was given medicine for a UTI, however the last dosage fell in the toilet. Patient would like to know if she can be prescribed more medicine. Patient would like medicine as soon as possible. Please f/u

## 2015-12-21 NOTE — Telephone Encounter (Signed)
Patient verified DOB Patient states she has not had any symptoms in the past two weeks. Patient informed of provider stating patient does not need another dose of medications. If patient symptoms reoccur patient instructed to make a nurse appointment to have urine checked. Patient had no further questions and expressed her understanding.

## 2015-12-25 ENCOUNTER — Ambulatory Visit: Payer: Self-pay | Admitting: Family Medicine

## 2015-12-30 ENCOUNTER — Ambulatory Visit: Payer: Self-pay | Attending: Family Medicine | Admitting: Family Medicine

## 2015-12-30 ENCOUNTER — Encounter: Payer: Self-pay | Admitting: Family Medicine

## 2015-12-30 VITALS — BP 124/85 | HR 82 | Temp 98.3°F | Resp 18 | Ht 66.0 in | Wt 188.6 lb

## 2015-12-30 DIAGNOSIS — H6120 Impacted cerumen, unspecified ear: Secondary | ICD-10-CM | POA: Insufficient documentation

## 2015-12-30 DIAGNOSIS — Z0000 Encounter for general adult medical examination without abnormal findings: Secondary | ICD-10-CM

## 2015-12-30 DIAGNOSIS — H9201 Otalgia, right ear: Secondary | ICD-10-CM | POA: Insufficient documentation

## 2015-12-30 MED ORDER — CETIRIZINE HCL 10 MG PO TABS: 10.0000 mg | ORAL_TABLET | Freq: Every day | ORAL | Status: DC

## 2015-12-30 MED ORDER — FLUTICASONE PROPIONATE 50 MCG/ACT NA SUSP: 2.0000 | Freq: Every day | NASAL | Status: DC

## 2015-12-30 MED ORDER — GUAIFENESIN ER 600 MG PO TB12: 600.0000 mg | ORAL_TABLET | Freq: Two times a day (BID) | ORAL | Status: DC

## 2015-12-30 NOTE — Patient Instructions (Signed)
It was a pleasure to see you today.   For the RIGHT EAR PAIN, I would like to address the nasal congestion as a possible cause of fluid accumulation behind the right ear drum.   CETIRIZINE 10mg  daily (mornings is fine); FLONASE 2 sprays/nostril, every morning; MUCINEX 600mg  tablets, take 1 tablet by mouth twice daily.  Please call to follow up in 2 weeks if not improving, or if worsening call sooner for follow up.   Dalbert Mayotte, MD

## 2015-12-30 NOTE — Progress Notes (Signed)
Patient here or Pain in the ear  Patient complains of right ear pain being present intermittently. Patient complains of sharp pains intermittently with the ear being tender "on the inside" Pain has been present for 2 weeks. Patient admits to using bobby pins to "dig" in her ear.  Patient would like her flu shot today. Patient tolerated flu shot well

## 2015-12-30 NOTE — Progress Notes (Signed)
   Subjective:    Patient ID: Katelyn Hardin, female    DOB: [birth year], 45 y.o.   MRN: [id number]  HPI Patient here for evaluation of pain in the RIGHT EAR, started about 2 weeks ago. Sometimes is sharp and stabbing, although has not been this way in the past 2 days. No fevers or chills. Not aware of having a cold recently. Used a bobby pin to scratch in the ear recently, no blood or discharge from ear. No changes in hearing, no tinnitus, no balance issues or dizziness.   Reports some pain in RIGHT LOWER tooth, however not aware of jaw clenching.  No pain with biting/chewing.     Review of Systems No fever or chills, no cough; reports nasal and facial congestion.     Objective:   Physical Exam  GENERAL Well appearing, no distress HEENT Neck supple, no cervical adenopathy or maxillary./frontal tenderness. EOMI. Clear oropharynx, moist mucus membranes. No tenderness to palpate tragus or pinna of either ear. TMs clear with good cones of light bilaterally. Minimal cerumen. Question serous fluid level behind R TM. Able to open jaw fully, no crepitus of jaw/TMJ.  No evident odontological infection.        Assessment & Plan:  1. RIGHT EAR PAIN-- suspect middle ear pressure/eustachian tube dysfunction based on exam. Also consider bruxism as possible cause.   Antihistamine, mucolytic, nasal steroid spray. Follow up in 2 weeks if not improved/resolved, or sooner if worsening.   Dalbert Mayotte, MD

## 2016-01-21 ENCOUNTER — Ambulatory Visit: Payer: Self-pay | Attending: Internal Medicine | Admitting: Internal Medicine

## 2016-01-21 ENCOUNTER — Encounter: Payer: Self-pay | Admitting: Internal Medicine

## 2016-01-21 VITALS — BP 129/87 | HR 77 | Temp 97.8°F | Resp 18 | Ht 66.0 in | Wt 191.0 lb

## 2016-01-21 DIAGNOSIS — Z885 Allergy status to narcotic agent status: Secondary | ICD-10-CM | POA: Insufficient documentation

## 2016-01-21 DIAGNOSIS — N951 Menopausal and female climacteric states: Secondary | ICD-10-CM

## 2016-01-21 DIAGNOSIS — M797 Fibromyalgia: Secondary | ICD-10-CM | POA: Insufficient documentation

## 2016-01-21 DIAGNOSIS — F329 Major depressive disorder, single episode, unspecified: Secondary | ICD-10-CM | POA: Insufficient documentation

## 2016-01-21 DIAGNOSIS — Z0001 Encounter for general adult medical examination with abnormal findings: Secondary | ICD-10-CM | POA: Insufficient documentation

## 2016-01-21 DIAGNOSIS — Z79899 Other long term (current) drug therapy: Secondary | ICD-10-CM | POA: Insufficient documentation

## 2016-01-21 DIAGNOSIS — K589 Irritable bowel syndrome without diarrhea: Secondary | ICD-10-CM | POA: Insufficient documentation

## 2016-01-21 DIAGNOSIS — E669 Obesity, unspecified: Secondary | ICD-10-CM | POA: Insufficient documentation

## 2016-01-21 DIAGNOSIS — I10 Essential (primary) hypertension: Secondary | ICD-10-CM | POA: Insufficient documentation

## 2016-01-21 DIAGNOSIS — H9201 Otalgia, right ear: Secondary | ICD-10-CM | POA: Insufficient documentation

## 2016-01-21 DIAGNOSIS — H8113 Benign paroxysmal vertigo, bilateral: Secondary | ICD-10-CM | POA: Insufficient documentation

## 2016-01-21 DIAGNOSIS — R42 Dizziness and giddiness: Secondary | ICD-10-CM | POA: Insufficient documentation

## 2016-01-21 MED ORDER — CLOTRIMAZOLE-BETAMETHASONE 1-0.05 % EX CREA: 1.0000 "application " | TOPICAL_CREAM | Freq: Two times a day (BID) | CUTANEOUS | Status: DC

## 2016-01-21 MED ORDER — PAROXETINE HCL 20 MG PO TABS: 20.0000 mg | ORAL_TABLET | Freq: Every day | ORAL | Status: DC

## 2016-01-21 MED ORDER — MECLIZINE HCL 25 MG PO TABS: 25.0000 mg | ORAL_TABLET | Freq: Three times a day (TID) | ORAL | Status: DC | PRN

## 2016-01-21 MED ORDER — HYDROCHLOROTHIAZIDE 25 MG PO TABS: 25.0000 mg | ORAL_TABLET | Freq: Every day | ORAL | Status: DC

## 2016-01-21 NOTE — Progress Notes (Signed)
Patient is here for Physical  Patient denies pain at this time.  Patient request refills on Antivert, and Zyrtec. Patient would like the alternative to Desonide as well.

## 2016-01-21 NOTE — Patient Instructions (Signed)
DASH Eating Plan °DASH stands for "Dietary Approaches to Stop Hypertension." The DASH eating plan is a healthy eating plan that has been shown to reduce high blood pressure (hypertension). Additional health benefits may include reducing the risk of type 2 diabetes mellitus, heart disease, and stroke. The DASH eating plan may also help with weight loss. °WHAT DO I NEED TO KNOW ABOUT THE DASH EATING PLAN? °For the DASH eating plan, you will follow these general guidelines: °· Choose foods with a percent daily value for sodium of less than 5% (as listed on the food label). °· Use salt-free seasonings or herbs instead of table salt or sea salt. °· Check with your health care provider or pharmacist before using salt substitutes. °· Eat lower-sodium products, often labeled as "lower sodium" or "no salt added." °· Eat fresh foods. °· Eat more vegetables, fruits, and low-fat dairy products. °· Choose whole grains. Look for the word "whole" as the first word in the ingredient list. °· Choose fish and skinless chicken or turkey more often than red meat. Limit fish, poultry, and meat to 6 oz (170 g) each day. °· Limit sweets, desserts, sugars, and sugary drinks. °· Choose heart-healthy fats. °· Limit cheese to 1 oz (28 g) per day. °· Eat more home-cooked food and less restaurant, buffet, and fast food. °· Limit fried foods. °· Cook foods using methods other than frying. °· Limit canned vegetables. If you do use them, rinse them well to decrease the sodium. °· When eating at a restaurant, ask that your food be prepared with less salt, or no salt if possible. °WHAT FOODS CAN I EAT? °Seek help from a dietitian for individual calorie needs. °Grains °Whole grain or whole wheat bread. Brown rice. Whole grain or whole wheat pasta. Quinoa, bulgur, and whole grain cereals. Low-sodium cereals. Corn or whole wheat flour tortillas. Whole grain cornbread. Whole grain crackers. Low-sodium crackers. °Vegetables °Fresh or frozen vegetables  (raw, steamed, roasted, or grilled). Low-sodium or reduced-sodium tomato and vegetable juices. Low-sodium or reduced-sodium tomato sauce and paste. Low-sodium or reduced-sodium canned vegetables.  °Fruits °All fresh, canned (in natural juice), or frozen fruits. °Meat and Other Protein Products °Ground beef (85% or leaner), grass-fed beef, or beef trimmed of fat. Skinless chicken or turkey. Ground chicken or turkey. Pork trimmed of fat. All fish and seafood. Eggs. Dried beans, peas, or lentils. Unsalted nuts and seeds. Unsalted canned beans. °Dairy °Low-fat dairy products, such as skim or 1% milk, 2% or reduced-fat cheeses, low-fat ricotta or cottage cheese, or plain low-fat yogurt. Low-sodium or reduced-sodium cheeses. °Fats and Oils °Tub margarines without trans fats. Light or reduced-fat mayonnaise and salad dressings (reduced sodium). Avocado. Safflower, olive, or canola oils. Natural peanut or almond butter. °Other °Unsalted popcorn and pretzels. °The items listed above may not be a complete list of recommended foods or beverages. Contact your dietitian for more options. °WHAT FOODS ARE NOT RECOMMENDED? °Grains °White bread. White pasta. White rice. Refined cornbread. Bagels and croissants. Crackers that contain trans fat. °Vegetables °Creamed or fried vegetables. Vegetables in a cheese sauce. Regular canned vegetables. Regular canned tomato sauce and paste. Regular tomato and vegetable juices. °Fruits °Dried fruits. Canned fruit in light or heavy syrup. Fruit juice. °Meat and Other Protein Products °Fatty cuts of meat. Ribs, chicken wings, bacon, sausage, bologna, salami, chitterlings, fatback, hot dogs, bratwurst, and packaged luncheon meats. Salted nuts and seeds. Canned beans with salt. °Dairy °Whole or 2% milk, cream, half-and-half, and cream cheese. Whole-fat or sweetened yogurt. Full-fat   cheeses or blue cheese. Nondairy creamers and whipped toppings. Processed cheese, cheese spreads, or cheese  curds. °Condiments °Onion and garlic salt, seasoned salt, table salt, and sea salt. Canned and packaged gravies. Worcestershire sauce. Tartar sauce. Barbecue sauce. Teriyaki sauce. Soy sauce, including reduced sodium. Steak sauce. Fish sauce. Oyster sauce. Cocktail sauce. Horseradish. Ketchup and mustard. Meat flavorings and tenderizers. Bouillon cubes. Hot sauce. Tabasco sauce. Marinades. Taco seasonings. Relishes. °Fats and Oils °Butter, stick margarine, lard, shortening, ghee, and bacon fat. Coconut, palm kernel, or palm oils. Regular salad dressings. °Other °Pickles and olives. Salted popcorn and pretzels. °The items listed above may not be a complete list of foods and beverages to avoid. Contact your dietitian for more information. °WHERE CAN I FIND MORE INFORMATION? °National [employer], [employer], [employer]: www.nhlbi.nih.gov/health/health-topics/topics/dash/ °  °This information is not intended to replace advice given to you by your health care provider. Make sure you discuss any questions you have with your health care provider. °  °Document Released: 10/27/2011 Document Revised: 11/28/2014 Document Reviewed: 09/11/2013 °Elsevier Interactive Patient Education ©2016 Elsevier Inc[employer] ° °Hypertension °Hypertension, commonly called high blood pressure, is when the force of blood pumping through your arteries is too strong. Your arteries are the blood vessels that carry blood from your heart throughout your body. A blood pressure reading consists of a higher number over a lower number, such as 110/72. The higher number (systolic) is the pressure inside your arteries when your heart pumps. The lower number (diastolic) is the pressure inside your arteries when your heart relaxes. Ideally you want your blood pressure below 120/80. °Hypertension forces your heart to work harder to pump blood. Your arteries may become narrow or stiff. Having untreated or uncontrolled hypertension can cause heart attack, stroke, kidney  disease, and other problems. °RISK FACTORS °Some risk factors for high blood pressure are controllable. Others are not.  °Risk factors you cannot control include:  °· Race. You may be at higher risk if you are African American. °· Age. Risk increases with age. °· Gender. Men are at higher risk than women before age 45 years. After age 65, women are at higher risk than men. °Risk factors you can control include: °· Not getting enough exercise or physical activity. °· Being overweight. °· Getting too much fat, sugar, calories, or salt in your diet. °· Drinking too much alcohol. °SIGNS AND SYMPTOMS °Hypertension does not usually cause signs or symptoms. Extremely high blood pressure (hypertensive crisis) may cause headache, anxiety, shortness of breath, and nosebleed. °DIAGNOSIS °To check if you have hypertension, your health care provider will measure your blood pressure while you are seated, with your arm held at the level of your heart. It should be measured at least twice using the same arm. Certain conditions can cause a difference in blood pressure between your right and left arms. A blood pressure reading that is higher than normal on one occasion does not mean that you need treatment. If it is not clear whether you have high blood pressure, you may be asked to return on a different day to have your blood pressure checked again. Or, you may be asked to monitor your blood pressure at home for 1 or more weeks. °TREATMENT °Treating high blood pressure includes making lifestyle changes and possibly taking medicine. Living a healthy lifestyle can help lower high blood pressure. You may need to change some of your habits. °Lifestyle changes may include: °· Following the DASH diet. This diet is high in fruits, vegetables, and whole   grains. It is low in salt, red meat, and added sugars. °· Keep your sodium intake below 2,300 mg per day. °· Getting at least 30-45 minutes of aerobic exercise at least 4 times per  week. °· Losing weight if necessary. °· Not smoking. °· Limiting alcoholic beverages. °· Learning ways to reduce stress. °Your health care provider may prescribe medicine if lifestyle changes are not enough to get your blood pressure under control, and if one of the following is true: °· You are 18-59 years of age and your systolic blood pressure is above 140. °· You are 60 years of age or older, and your systolic blood pressure is above 150. °· Your diastolic blood pressure is above 90. °· You have diabetes, and your systolic blood pressure is over 140 or your diastolic blood pressure is over 90. °· You have kidney disease and your blood pressure is above 140/90. °· You have heart disease and your blood pressure is above 140/90. °Your personal target blood pressure may vary depending on your medical conditions, your age, and other factors. °HOME CARE INSTRUCTIONS °· Have your blood pressure rechecked as directed by your health care provider.   °· Take medicines only as directed by your health care provider. Follow the directions carefully. Blood pressure medicines must be taken as prescribed. The medicine does not work as well when you skip doses. Skipping doses also puts you at risk for problems. °· Do not smoke.   °· Monitor your blood pressure at home as directed by your health care provider.  °SEEK MEDICAL CARE IF:  °· You think you are having a reaction to medicines taken. °· You have recurrent headaches or feel dizzy. °· You have swelling in your ankles. °· You have trouble with your vision. °SEEK IMMEDIATE MEDICAL CARE IF: °· You develop a severe headache or confusion. °· You have unusual weakness, numbness, or feel faint. °· You have severe chest or abdominal pain. °· You vomit repeatedly. °· You have trouble breathing. °MAKE SURE YOU:  °· Understand these instructions. °· Will watch your condition. °· Will get help right away if you are not doing well or get worse. °  °This information is not intended to  replace advice given to you by your health care provider. Make sure you discuss any questions you have with your health care provider. °  °Document Released: 11/07/2005 Document Revised: 03/24/2015 Document Reviewed: 08/30/2013 °Elsevier Interactive Patient Education ©2016 Elsevier Inc. ° °

## 2016-01-21 NOTE — Progress Notes (Signed)
Katelyn Hardin, is a 45 y.o. female  [id number]  [id number]  DOB - [birth year]  CC:  Chief Complaint  Patient presents with  . Annual Exam       HPI: Katelyn Hardin is a 45 y.o. female here today for follow up and check up for her job. Patient has history of Hypertension, IBS, Benign Paroxysmal positional vertigo (on Antivert prn), Obesity, Depression and Insomnia. Patient was seen in this clinic by Dr Lindell Noe on 12/30/15 for rt ear pain. Patient states symptoms have improved with Mucinex and Flonase. Patient has continued complaints of dizziness with positional changes. Patient believes dizziness may be related to low blood pressure. Patient has concern that she is taking two much medication for BP. Patient taking lisinopril 10 mg and Hydrodiuril 25 mg. Patient discontinued phentermine, ambien and gabapentin "didn't feel like they were helping". Patient would like refills of zyrtec and antivert. Patient to return in one month with BP logs and for physical, mammogram and pap smear. Patient has No headache, No chest pain, No abdominal pain - No Nausea, No new weakness tingling or numbness, No Cough - SOB.  Allergies  Allergen Reactions  . Acetaminophen Other (See Comments)    "Bothers her- jittery and nausea."  . Codeine     Upset stomach  . Oxycodone-Acetaminophen     Upset stomach  . Vicodin [Hydrocodone-Acetaminophen] Nausea Only   Past Medical History  Diagnosis Date  . Bacterial vaginosis   . Hypertension   . Anemia   . Fibromyalgia   . Fibroids    Current Outpatient Prescriptions on File Prior to Visit  Medication Sig Dispense Refill  . ferrous fumarate (HEMOCYTE - 106 MG FE) 325 (106 FE) MG TABS Take 1 tablet by mouth 2 (two) times daily.    . fluticasone (FLONASE) 50 MCG/ACT nasal spray Place 2 sprays into both nostrils daily. 16 g 2  . guaiFENesin (MUCINEX) 600 MG 12 hr tablet Take 1 tablet (600 mg total) by mouth 2 (two) times daily. 20 tablet 3  .  lisinopril (PRINIVIL,ZESTRIL) 10 MG tablet Take 1 tablet (10 mg total) by mouth daily. 90 tablet 3  . Multiple Vitamin (MULTIVITAMIN WITH MINERALS) TABS Take 1 tablet by mouth daily.    . cetirizine (ZYRTEC) 10 MG tablet Take 1 tablet (10 mg total) by mouth daily. (Patient not taking: Reported on 01/21/2016) 30 tablet 11  . Norgestimate-Ethinyl Estradiol Triphasic 0.18/0.215/0.25 MG-25 MCG tab Take 1 tablet by mouth daily. (Patient not taking: Reported on 06/04/2015) 1 Package 3  . Vitamin D, Ergocalciferol, (DRISDOL) 50000 UNITS CAPS capsule Take 1 capsule (50,000 Units total) by mouth every 7 (seven) days. (Patient not taking: Reported on 06/04/2015) 8 capsule 2   No current facility-administered medications on file prior to visit.   Family History  Problem Relation Age of Onset  . Thyroid disease Sister   . Cancer Maternal Aunt     breast   Social History   Social History  . Marital Status: Married    Spouse Name: N/A  . Number of Children: N/A  . Years of Education: N/A   Occupational History  . Not on file.   Social History Main Topics  . Smoking status: Never Smoker   . Smokeless tobacco: Never Used  . Alcohol Use: 3.6 oz/week    4 Standard drinks or equivalent, 2 Glasses of wine per week     Comment: 2 glasses of wine daily  . Drug Use: No  . Sexual Activity: Yes  Birth Control/ Protection: Surgical, Condom   Other Topics Concern  . Not on file   Social History Narrative    Review of Systems: Constitutional: Negative for fever, chills, diaphoresis, activity change, appetite change and fatigue. HENT: Negative for ear pain, nosebleeds, congestion, facial swelling, rhinorrhea, neck pain, neck stiffness and ear discharge.  Eyes: Negative for pain, discharge, redness, itching and visual disturbance. Respiratory: Negative for cough, choking, chest tightness, shortness of breath, wheezing and stridor.  Cardiovascular: Negative for chest pain, palpitations and leg  swelling. Gastrointestinal: Negative for abdominal distention. Genitourinary: Negative for dysuria, urgency, frequency, hematuria, flank pain, decreased urine volume, difficulty urinating and dyspareunia.  Musculoskeletal: Negative for back pain, joint swelling, arthralgia and gait problem. Neurological: Negative for dizziness, tremors, seizures, syncope, facial asymmetry, speech difficulty, weakness, light-headedness, numbness and headaches.  Hematological: Negative for adenopathy. Does not bruise/bleed easily. Psychiatric/Behavioral: Negative for hallucinations, behavioral problems, confusion, dysphoric mood, decreased concentration and agitation.    Objective:   Filed Vitals:   01/21/16 1017  BP: 129/87  Pulse: 77  Temp: 97.8 F (36.6 C)  Resp: 18    Physical Exam: Constitutional: Patient appears well-developed and well-nourished. No distress. HENT: Normocephalic, atraumatic, External right and left ear normal. Oropharynx is clear and moist.  Eyes: Conjunctivae and EOM are normal. PERRLA, no scleral icterus. Neck: Normal ROM. Neck supple.  CVS: RRR, S1/S2 +, no murmurs, no gallops, no carotid bruit.  Pulmonary: Effort and breath sounds normal, no stridor, rhonchi, wheezes, rales.  Abdominal: Soft. BS +, no distension, tenderness, rebound or guarding.  Musculoskeletal: Normal range of motion. No edema and no tenderness.  Lymphadenopathy: No lymphadenopathy noted. Neuro: Alert & Oriented x 4 Skin: Skin is warm and dry. No rash noted. Not diaphoretic. No erythema. No pallor. Psychiatric: Normal mood and affect. Behavior, judgment, thought content normal.  Lab Results  Component Value Date   WBC 8.2 06/08/2014   HGB 12.7 06/08/2014   HCT 37.9 06/08/2014   MCV 95.0 06/08/2014   PLT 286 06/08/2014   Lab Results  Component Value Date   CREATININE 0.63 07/07/2014   BUN 10 07/07/2014   NA 140 07/07/2014   K 4.3 07/07/2014   CL 108 07/07/2014   CO2 29 07/07/2014    Lab  Results  Component Value Date   HGBA1C 5.10 12/04/2014   Lipid Panel     Component Value Date/Time   CHOL 154 04/04/2013 1022   TRIG 101 04/04/2013 1022   HDL 43 04/04/2013 1022   CHOLHDL 3.6 04/04/2013 1022   VLDL 20 04/04/2013 1022   LDLCALC 91 04/04/2013 1022   Assessment and plan:   Katelyn Hardin was seen today for annual exam.  Diagnoses and all orders for this visit:  Menopausal hot flushes -     PARoxetine (PAXIL) 20 MG tablet; Take 1 tablet (20 mg total) by mouth daily.  Benign paroxysmal positional vertigo, bilateral -     meclizine (ANTIVERT) 25 MG tablet; Take 1 tablet (25 mg total) by mouth 3 (three) times daily as needed for dizziness.  Essential hypertension, benign  Other orders -     hydrochlorothiazide (HYDRODIURIL) 25 MG tablet; Take 1 tablet (25 mg total) by mouth daily. -     clotrimazole-betamethasone (LOTRISONE) cream; Apply 1 application topically 2 (two) times daily.   Return in about 4 weeks (around 02/18/2016) for Pap Smear, mammogram.  The patient was given clear instructions to go to ER or return to medical center if symptoms don't improve, worsen or new  problems develop. The patient verbalized understanding. The patient was told to call to get lab results if they haven't heard anything in the next week.    [facility] [phone] 01/21/2016, 11:18 AM   Evaluation and management procedures were performed by the Advanced Practitioner under my supervision and collaboration. I have reviewed the Advanced Practitioner's note and chart, and I agree with the management and plan.  Angelica Chessman, MD, [place], [place], [place], [place] and [place], [place]   01/21/2016, 6:41 PM

## 2016-02-11 ENCOUNTER — Other Ambulatory Visit: Payer: Self-pay | Admitting: Internal Medicine

## 2016-02-15 ENCOUNTER — Ambulatory Visit: Payer: Self-pay | Attending: Internal Medicine

## 2016-02-15 DIAGNOSIS — R7611 Nonspecific reaction to tuberculin skin test without active tuberculosis: Secondary | ICD-10-CM

## 2016-02-18 ENCOUNTER — Other Ambulatory Visit: Payer: Self-pay | Admitting: Internal Medicine

## 2016-05-11 ENCOUNTER — Telehealth: Payer: Self-pay | Admitting: Internal Medicine

## 2016-05-11 NOTE — Telephone Encounter (Signed)
Pt. 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  Pt. Had an x-ray done and everything came out good. Pt. Job requires  For her to have a TB test done every year and would like to know if she  Has to do the TB test again even though she test positive. Please f/u with pt.

## 2016-05-13 NOTE — Telephone Encounter (Signed)
Since she has tested positive, most places will require that she have a chest x-ray done. However, this will be up to Dr. Doreene Burke so I will forward to him.

## 2016-05-13 NOTE — Telephone Encounter (Signed)
Pt. 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  Pt. Had an x-ray done and everything came out good. Pt. Job requires  For her to have a TB test done every year and would like to know if she  Has to do the TB test again even though she test positive. Please f/u with pt.

## 2016-05-25 NOTE — Telephone Encounter (Signed)
Patient was advised of going to [facility] to have xray completed which order has been placed in the system.

## 2016-05-25 NOTE — Telephone Encounter (Signed)
Patient does not need to do the TB Test every year if she already tested positive. She had a recent (Feb 2016) CXR done which is still good for this year's TB screening. We may give the result to any requesting authority. Thanks

## 2016-05-26 ENCOUNTER — Ambulatory Visit (HOSPITAL_COMMUNITY)
Admission: RE | Admit: 2016-05-26 | Discharge: 2016-05-26 | Disposition: A | Discharge status: Home / Self Care | Payer: Self-pay | Source: Ambulatory Visit | Attending: Internal Medicine | Admitting: Internal Medicine

## 2016-05-26 DIAGNOSIS — R7611 Nonspecific reaction to tuberculin skin test without active tuberculosis: Secondary | ICD-10-CM | POA: Insufficient documentation

## 2016-05-27 ENCOUNTER — Telehealth: Payer: Self-pay | Admitting: Internal Medicine

## 2016-05-27 NOTE — Telephone Encounter (Signed)
Pt called to see if her x-ray results were in  Pt would like to be contacted as soon as possible with those results due to her needing them for her job Pt also asked about getting a physical copy of the results

## 2016-05-27 NOTE — Telephone Encounter (Signed)
Pt called stating that she is having prominent UTI symptoms  Pt would like to know if she can be prescribed medication without having to come in. Pt stated this has been done before Please let pt know if this is doable  Thank you

## 2016-05-31 NOTE — Telephone Encounter (Signed)
Pt. Called stating she had an x-ray done for the TB and needs her results for her job. Please f/u with pt.

## 2016-06-01 NOTE — Telephone Encounter (Signed)
Pt. Called stating she had an x-ray done for the TB and needs her results for her job. Please f/u with pt.

## 2016-06-03 ENCOUNTER — Other Ambulatory Visit: Payer: Self-pay | Admitting: Physician Assistant

## 2016-06-03 ENCOUNTER — Ambulatory Visit: Payer: Self-pay | Attending: Internal Medicine | Admitting: Physician Assistant

## 2016-06-03 ENCOUNTER — Encounter: Payer: Self-pay | Admitting: *Deleted

## 2016-06-03 VITALS — BP 150/89 | HR 77 | Temp 98.1°F | Resp 16 | Wt 190.8 lb

## 2016-06-03 DIAGNOSIS — I10 Essential (primary) hypertension: Secondary | ICD-10-CM

## 2016-06-03 DIAGNOSIS — N3001 Acute cystitis with hematuria: Secondary | ICD-10-CM

## 2016-06-03 LAB — POCT URINALYSIS DIPSTICK
GLUCOSE UA: NEGATIVE
KETONES UA: NEGATIVE
NITRITE UA: NEGATIVE
Protein, UA: 100
Spec Grav, UA: 1.025
Urobilinogen, UA: 2
pH, UA: 5.5

## 2016-06-03 MED ORDER — FLUCONAZOLE 150 MG PO TABS: 150.0000 mg | ORAL_TABLET | Freq: Once | ORAL | Status: DC

## 2016-06-03 MED ORDER — NITROFURANTOIN MONOHYD MACRO 100 MG PO CAPS: 100.0000 mg | ORAL_CAPSULE | Freq: Two times a day (BID) | ORAL | Status: DC

## 2016-06-03 MED ORDER — HYDROCHLOROTHIAZIDE 25 MG PO TABS: 25.0000 mg | ORAL_TABLET | Freq: Every day | ORAL | Status: DC

## 2016-06-03 MED ORDER — LISINOPRIL 10 MG PO TABS: 10.0000 mg | ORAL_TABLET | Freq: Every day | ORAL | Status: DC

## 2016-06-03 NOTE — Progress Notes (Signed)
Pt is in the office today for uti Pt states the symptoms started last Wednesday Pt is having frequent urination, odor, bladder feels full, and painful sensation after urinating Pt was taking the azo but she hasn't taken any since Wednesday.  Pt is not sure if she took her bp medication today.

## 2016-06-03 NOTE — Telephone Encounter (Signed)
Patient presented to the office for a [facility] appt and received the results and letter in office

## 2016-06-03 NOTE — Telephone Encounter (Signed)
Patient received the information in office during her OV today.

## 2016-06-03 NOTE — Patient Instructions (Signed)
Drink 80-100 ounces of water daily  Urinary Tract Infection Urinary tract infections (UTIs) can develop anywhere along your urinary tract. Your urinary tract is your body's drainage system for removing wastes and extra water. Your urinary tract includes two kidneys, two ureters, a bladder, and a urethra. Your kidneys are a pair of bean-shaped organs. Each kidney is about the size of your fist. They are located below your ribs, one on each side of your spine. CAUSES Infections are caused by microbes, which are microscopic organisms, including fungi, viruses, and bacteria. These organisms are so small that they can only be seen through a microscope. Bacteria are the microbes that most commonly cause UTIs. SYMPTOMS  Symptoms of UTIs may vary by age and gender of the patient and by the location of the infection. Symptoms in young women typically include a frequent and intense urge to urinate and a painful, burning feeling in the bladder or urethra during urination. Older women and men are more likely to be tired, shaky, and weak and have muscle aches and abdominal pain. A fever may mean the infection is in your kidneys. Other symptoms of a kidney infection include pain in your back or sides below the ribs, nausea, and vomiting. DIAGNOSIS To diagnose a UTI, your caregiver will ask you about your symptoms. Your caregiver will also ask you to provide a urine sample. The urine sample will be tested for bacteria and white blood cells. White blood cells are made by your body to help fight infection. TREATMENT  Typically, UTIs can be treated with medication. Because most UTIs are caused by a bacterial infection, they usually can be treated with the use of antibiotics. The choice of antibiotic and length of treatment depend on your symptoms and the type of bacteria causing your infection. HOME CARE INSTRUCTIONS  If you were prescribed antibiotics, take them exactly as your caregiver instructs you. Finish the  medication even if you feel better after you have only taken some of the medication.  Drink enough water and fluids to keep your urine clear or pale yellow.  Avoid caffeine, tea, and carbonated beverages. They tend to irritate your bladder.  Empty your bladder often. Avoid holding urine for long periods of time.  Empty your bladder before and after sexual intercourse.  After a bowel movement, women should cleanse from front to back. Use each tissue only once. SEEK MEDICAL CARE IF:   You have back pain.  You develop a fever.  Your symptoms do not begin to resolve within 3 days. SEEK IMMEDIATE MEDICAL CARE IF:   You have severe back pain or lower abdominal pain.  You develop chills.  You have nausea or vomiting.  You have continued burning or discomfort with urination. MAKE SURE YOU:   Understand these instructions.  Will watch your condition.  Will get help right away if you are not doing well or get worse.   This information is not intended to replace advice given to you by your health care provider. Make sure you discuss any questions you have with your health care provider.   Document Released: 08/17/2005 Document Revised: 07/29/2015 Document Reviewed: 12/16/2011 Elsevier Interactive Patient Education [employer].

## 2016-06-03 NOTE — Progress Notes (Signed)
Patient ID: Katelyn Hardin, female   DOB: January 01, 1971, 45 y.o.   MRN: [id number]   Malakai Beaton, is a 45 y.o. female  [id number]  [id number]  DOB - August 02, 1971  CC:  Pain and pressure with urination      Subjective:  Chief Complaint and HPI: Katelyn Hardin is a 45 y.o. female here today for urinary frequency, dysuria X 9 days.  She is also requesting 90d RF of her BP meds.  She has not taken her BP meds today.  She denies F/C.  No N/V/D.  No abdominal or flank pain.  Denies vaginal s/sx.  No STD risk factors.     ROS:   Constitutional:  No f/c, No night sweats, No unexplained weight loss. EENT:  No vision changes, No blurry vision, No hearing changes. No mouth, throat, or ear problems.  Respiratory: No cough, No SOB Cardiac: No CP, no palpitations GI:  No abd pain, No N/V/D. GU: No Urinary s/sx Musculoskeletal: No joint pain Neuro: No headache, no dizziness, no motor weakness.  Skin: No rash Endocrine:  No polydipsia. No polyuria.  Psych: Denies SI/HI  No problems updated.  ALLERGIES: Allergies  Allergen Reactions  . Acetaminophen Other (See Comments)    "Bothers her- jittery and nausea."  . Codeine     Upset stomach  . Oxycodone-Acetaminophen     Upset stomach  . Vicodin [Hydrocodone-Acetaminophen] Nausea Only    PAST MEDICAL HISTORY: Past Medical History  Diagnosis Date  . Bacterial vaginosis   . Hypertension   . Anemia   . Fibromyalgia   . Fibroids     MEDICATIONS AT HOME: Prior to Admission medications   Medication Sig Start Date End Date Taking? Authorizing Provider  cetirizine (ZYRTEC) 10 MG tablet Take 1 tablet (10 mg total) by mouth daily. 12/30/15  Yes Willeen Niece, MD  clotrimazole-betamethasone (LOTRISONE) cream Apply 1 application topically 2 (two) times daily. 01/21/16  Yes Tresa Garter, MD  ferrous fumarate (HEMOCYTE - 106 MG FE) 325 (106 FE) MG TABS Take 1 tablet by mouth 2 (two) times daily.   Yes Historical  Provider, MD  hydrochlorothiazide (HYDRODIURIL) 25 MG tablet Take 1 tablet (25 mg total) by mouth daily. 06/03/16  Yes Dionne Bucy Sama Arauz, PA-C  lisinopril (PRINIVIL,ZESTRIL) 10 MG tablet Take 1 tablet (10 mg total) by mouth daily. 06/03/16  Yes Argentina Donovan, PA-C  meclizine (ANTIVERT) 25 MG tablet Take 1 tablet (25 mg total) by mouth 3 (three) times daily as needed for dizziness. 01/21/16  Yes Tresa Garter, MD  Multiple Vitamin (MULTIVITAMIN WITH MINERALS) TABS Take 1 tablet by mouth daily.   Yes Historical Provider, MD  Norgestimate-Ethinyl Estradiol Triphasic 0.18/0.215/0.25 MG-25 MCG tab Take 1 tablet by mouth daily. 01/22/14  Yes Kassie Mends, MD  fluconazole (DIFLUCAN) 150 MG tablet Take 1 tablet (150 mg total) by mouth once. 06/03/16   Argentina Donovan, PA-C  fluticasone (FLONASE) 50 MCG/ACT nasal spray Place 2 sprays into both nostrils daily. Patient not taking: Reported on 06/03/2016 12/30/15   Willeen Niece, MD  guaiFENesin (MUCINEX) 600 MG 12 hr tablet Take 1 tablet (600 mg total) by mouth 2 (two) times daily. Patient not taking: Reported on 06/03/2016 12/30/15   Willeen Niece, MD  nitrofurantoin, macrocrystal-monohydrate, (MACROBID) 100 MG capsule Take 1 capsule (100 mg total) by mouth 2 (two) times daily. 06/03/16   Argentina Donovan, PA-C  PARoxetine (PAXIL) 20 MG tablet Take 1 tablet (20 mg total) by mouth daily.  Patient not taking: Reported on 06/03/2016 01/21/16 01/20/17  Tresa Garter, MD  Vitamin D, Ergocalciferol, (DRISDOL) 50000 UNITS CAPS capsule Take 1 capsule (50,000 Units total) by mouth every 7 (seven) days. Patient not taking: Reported on 06/04/2015 07/02/13   Tresa Garter, MD     Objective:  EXAM:   Filed Vitals:   06/03/16 1457  BP: 150/89  Pulse: 77  Temp: 98.1 F (36.7 C)  TempSrc: Oral  Resp: 16  Weight: 190 lb 12.8 oz (86.546 kg)  SpO2: 99%    General appearance : A&OX3. NAD. Non-toxic-appearing HEENT: Atraumatic and Normocephalic.   Mouth-MMM, post  pharynx WN Neck: supple, no JVD. No cervical lymphadenopathy. No thyromegaly Chest/Lungs:  Breathing-non-labored, Good air entry bilaterally, breath sounds normal without rales, rhonchi, or wheezing  CVS: S1 S2 regular, no murmurs, gallops, rubs  Abdomen: Bowel sounds present, Non tender and not distended with no gaurding, rigidity or rebound. No CVA tenderness.  Extremities: Bilateral Lower Ext shows no edema, both legs are warm to touch with = pulse throughout Neurology:  CN II-XII grossly intact, Non focal.   Psych:  TP linear. J/I WNL. Normal speech. Appropriate eye contact and affect.  Skin:  No Rash  Data Review Lab Results  Component Value Date   HGBA1C 5.10 12/04/2014   HGBA1C 5.0 02/22/2013     Assessment & Plan   1. Acute cystitis with hematuria Increase water intake.  Urinary hygiene education discussed at length and provided on AVS.  - Urine culture - GC/chlamydia probe amp, urine - POCT urinalysis dipstick - nitrofurantoin, macrocrystal-monohydrate, (MACROBID) 100 MG capsule; Take 1 capsule (100 mg total) by mouth 2 (two) times daily.  Dispense: 10 capsule; Refill: 0 - fluconazole (DIFLUCAN) 150 MG tablet; Take 1 tablet (150 mg total) by mouth once.  Dispense: 1 tablet; Refill: 0 Rx sent if needed after antibiotics  2. Essential hypertension, benign Sent 90d RF for Lisinopril and HCTZ She didn't taker her BP meds today.  Check BP OOO and record and bring to next f/up.  - lisinopril (PRINIVIL,ZESTRIL) 10 MG tablet; Take 1 tablet (10 mg total) by mouth daily.  Dispense: 90 tablet; Refill: 1  Patient have been counseled extensively about nutrition and exercise  Return in about 4 weeks (around 07/01/2016) for with Dr Doreene Burke for BP and hormone changes.  The patient was given clear instructions to go to ER or return to medical center if symptoms don't improve, worsen or new problems develop. The patient verbalized understanding. The patient was told to call to get lab  results if they haven't heard anything in the next week.     Freeman Caldron, PA-C [facility] and [place], [place]   06/03/2016, 4:02 PM

## 2016-06-04 LAB — URINE CULTURE: Colony Count: 85000

## 2016-06-06 ENCOUNTER — Telehealth: Payer: Self-pay

## 2016-06-06 LAB — GC/CHLAMYDIA PROBE AMP
CT PROBE, AMP APTIMA: NOT DETECTED
GC Probe RNA: NOT DETECTED

## 2016-06-06 NOTE — Telephone Encounter (Signed)
Pt returned call and is aware of urine culture results

## 2016-06-06 NOTE — Telephone Encounter (Signed)
Contacted pt to go over lab results pt did not answer lvm for pt to give me a call back at her earliest convenience  

## 2016-06-10 NOTE — Telephone Encounter (Signed)
Patient is aware and recieved a copy of the xray and letter for work in the office.

## 2016-06-10 NOTE — Telephone Encounter (Signed)
-----   Message from Tresa Garter, MD sent at 06/08/2016  9:30 AM EDT ----- Please inform patient that her CXR showed no evidence of acute or old tuberculous infection and no other active cardiopulmonary disease

## 2016-06-23 ENCOUNTER — Other Ambulatory Visit: Payer: Self-pay | Admitting: *Deleted

## 2016-06-23 ENCOUNTER — Ambulatory Visit: Payer: Self-pay | Attending: Internal Medicine | Admitting: Internal Medicine

## 2016-06-23 ENCOUNTER — Encounter: Payer: Self-pay | Admitting: Internal Medicine

## 2016-06-23 VITALS — BP 107/71 | HR 86 | Temp 98.0°F | Resp 18 | Ht 66.0 in | Wt 191.4 lb

## 2016-06-23 DIAGNOSIS — I10 Essential (primary) hypertension: Secondary | ICD-10-CM | POA: Insufficient documentation

## 2016-06-23 DIAGNOSIS — N951 Menopausal and female climacteric states: Secondary | ICD-10-CM | POA: Insufficient documentation

## 2016-06-23 DIAGNOSIS — H8110 Benign paroxysmal vertigo, unspecified ear: Secondary | ICD-10-CM | POA: Insufficient documentation

## 2016-06-23 DIAGNOSIS — Z885 Allergy status to narcotic agent status: Secondary | ICD-10-CM | POA: Insufficient documentation

## 2016-06-23 DIAGNOSIS — D649 Anemia, unspecified: Secondary | ICD-10-CM | POA: Insufficient documentation

## 2016-06-23 DIAGNOSIS — K589 Irritable bowel syndrome without diarrhea: Secondary | ICD-10-CM | POA: Insufficient documentation

## 2016-06-23 DIAGNOSIS — M797 Fibromyalgia: Secondary | ICD-10-CM | POA: Insufficient documentation

## 2016-06-23 DIAGNOSIS — E669 Obesity, unspecified: Secondary | ICD-10-CM | POA: Insufficient documentation

## 2016-06-23 DIAGNOSIS — Z79899 Other long term (current) drug therapy: Secondary | ICD-10-CM | POA: Insufficient documentation

## 2016-06-23 DIAGNOSIS — Z1239 Encounter for other screening for malignant neoplasm of breast: Secondary | ICD-10-CM

## 2016-06-23 DIAGNOSIS — H8113 Benign paroxysmal vertigo, bilateral: Secondary | ICD-10-CM

## 2016-06-23 DIAGNOSIS — F329 Major depressive disorder, single episode, unspecified: Secondary | ICD-10-CM | POA: Insufficient documentation

## 2016-06-23 MED ORDER — RAMELTEON 8 MG PO TABS: 8.0000 mg | ORAL_TABLET | Freq: Every day | ORAL | 3 refills | Status: DC

## 2016-06-23 MED ORDER — MECLIZINE HCL 25 MG PO TABS: 25.0000 mg | ORAL_TABLET | Freq: Three times a day (TID) | ORAL | 0 refills | Status: DC | PRN

## 2016-06-23 MED ORDER — PAROXETINE HCL 40 MG PO TABS: 40.0000 mg | ORAL_TABLET | Freq: Every day | ORAL | 3 refills | Status: DC

## 2016-06-23 MED ORDER — ADULT MULTIVITAMIN W/MINERALS CH: 1.0000 | ORAL_TABLET | Freq: Every day | ORAL | 3 refills | Status: DC

## 2016-06-23 NOTE — Progress Notes (Signed)
Katelyn Hardin, is a 45 y.o. female  [id number]  [id number]  DOB - [birth year]  Chief Complaint  Patient presents with  . Menopause      Subjective:   Katelyn Hardin is a 45 y.o. female with history of hypertension, IBS, fibromyalgia, chronic anemia, benign positional vertigo, obesity and depression here today for a follow up visit. Her major complaint today is hot flashes from menopause. Patient has significant hot flashes especially at night and sweatiness and insomnia. She has tried all over-the-counter medications but no significant relief. Patient's blood pressure is controlled, she denies any significant pain today. She has all her medications, she is compliant, she does not need any refill today. She said her depression is improved, but she has no suicidal ideation or thoughts. Patient has No headache, No chest pain, No abdominal pain - No Nausea, No new weakness tingling or numbness, No Cough - SOB.  Problem  Obesity  Screening for Breast Cancer    ALLERGIES: Allergies  Allergen Reactions  . Acetaminophen Other (See Comments)    "Bothers her- jittery and nausea."  . Codeine     Upset stomach  . Oxycodone-Acetaminophen     Upset stomach  . Vicodin [Hydrocodone-Acetaminophen] Nausea Only    PAST MEDICAL HISTORY: Past Medical History:  Diagnosis Date  . Anemia   . Bacterial vaginosis   . Fibroids   . Fibromyalgia   . Hypertension     MEDICATIONS AT HOME: Prior to Admission medications   Medication Sig Start Date End Date Taking? Authorizing Provider  cetirizine (ZYRTEC) 10 MG tablet Take 1 tablet (10 mg total) by mouth daily. 12/30/15  Yes Willeen Niece, MD  clotrimazole-betamethasone (LOTRISONE) cream Apply 1 application topically 2 (two) times daily. 01/21/16  Yes Tresa Garter, MD  fluticasone (FLONASE) 50 MCG/ACT nasal spray Place 2 sprays into both nostrils daily. 12/30/15  Yes Willeen Niece, MD  hydrochlorothiazide (HYDRODIURIL) 25  MG tablet Take 1 tablet (25 mg total) by mouth daily. 06/03/16  Yes Dionne Bucy McClung, PA-C  lisinopril (PRINIVIL,ZESTRIL) 10 MG tablet Take 1 tablet (10 mg total) by mouth daily. 06/03/16  Yes Argentina Donovan, PA-C  meclizine (ANTIVERT) 25 MG tablet Take 1 tablet (25 mg total) by mouth 3 (three) times daily as needed for dizziness. 01/21/16  Yes Tresa Garter, MD  Multiple Vitamin (MULTIVITAMIN WITH MINERALS) TABS tablet Take 1 tablet by mouth daily. 06/23/16  Yes Tresa Garter, MD  Norgestimate-Ethinyl Estradiol Triphasic 0.18/0.215/0.25 MG-25 MCG tab Take 1 tablet by mouth daily. Patient not taking: Reported on 06/23/2016 01/22/14   Kassie Mends, MD  PARoxetine (PAXIL) 40 MG tablet Take 1 tablet (40 mg total) by mouth daily. 06/23/16 06/23/17  Tresa Garter, MD     Objective:   Vitals:   06/23/16 1501  BP: 107/71  Pulse: 86  Resp: 18  Temp: 98 F (36.7 C)  TempSrc: Oral  SpO2: 99%  Weight: 191 lb 6.4 oz (86.8 kg)  Height: 5\' 6"  (1.676 m)    Exam General appearance : Awake, alert, not in any distress. Speech Clear. Not toxic looking, obese HEENT: Atraumatic and Normocephalic, pupils equally reactive to light and accomodation Neck: Supple, no JVD. No cervical lymphadenopathy.  Chest: Good air entry bilaterally, no added sounds  CVS: S1 S2 regular, no murmurs.  Abdomen: Bowel sounds present, Non tender and not distended with no gaurding, rigidity or rebound. Extremities: B/L Lower Ext shows no edema, both legs are warm to touch  Neurology: Awake alert, and oriented X 3, CN II-XII intact, Non focal Skin: No Rash  Data Review Lab Results  Component Value Date   HGBA1C 5.10 12/04/2014   HGBA1C 5.0 02/22/2013     Assessment & Plan   1. Essential hypertension, Controlled  We have discussed target BP range and blood pressure goal. I have advised patient to check BP regularly and to call us back or report to clinic if the numbers are consistently higher than 140/90. We  discussed the importance of compliance with medical therapy and DASH diet recommended, consequences of uncontrolled hypertension discussed.  - continue current BP medications  2. Menopausal hot flushes - Trial of an  an SSRI  - Patient counseled - PARoxetine (PAXIL) 40 MG tablet; Take 1 tablet (40 mg total) by mouth daily.  Dispense: 30 tablet; Refill: 3  3. Obesity  Aim for 30 minutes of exercise most days. Rethink what you drink. Water is great! Aim for 2-3 Carb Choices per meal (30-45 grams) +/- 1 either way  Aim for 0-15 Carbs per snack if hungry  Include protein in moderation with your meals and snacks  Consider reading food labels for Total Carbohydrate and Fat Grams of foods  Be mindful about how much sugar you are adding to beverages and other foods. Fruit Punch - find one with no sugar  Measure and decrease portions of carbohydrate foods  Make your plate and don't go back for seconds  4. Screening for breast cancer  - MM Digital Screening; Future  Patient have been counseled extensively about nutrition and exercise  Return in about 6 months (around 12/24/2016) for Follow up HTN, Follow up Pain and comorbidities.  The patient was given clear instructions to go to ER or return to medical center if symptoms don't improve, worsen or new problems develop. The patient verbalized understanding. The patient was told to call to get lab results if they haven't heard anything in the next week.   This note has been created with Surveyor, quantity. Any transcriptional errors are unintentional.    Angelica Chessman, MD, [place], [place], [place], [place] and [place], [place]   06/23/2016, 3:19 PM

## 2016-06-23 NOTE — Patient Instructions (Signed)
Menopause Menopause is the normal time of life when menstrual periods stop completely. Menopause is complete when you have missed 12 consecutive menstrual periods. It usually occurs between the ages of 48 years and 55 years. Very rarely does a woman develop menopause before the age of 40 years. At menopause, your ovaries stop producing the female hormones estrogen and progesterone. This can cause undesirable symptoms and also affect your health. Sometimes the symptoms may occur 4-5 years before the menopause begins. There is no relationship between menopause and:  Oral contraceptives.  Number of children you had.  Race.  The age your menstrual periods started (menarche). Heavy smokers and very thin women may develop menopause earlier in life. CAUSES  The ovaries stop producing the female hormones estrogen and progesterone.  Other causes include:  Surgery to remove both ovaries.  The ovaries stop functioning for no known reason.  Tumors of the pituitary gland in the brain.  Medical disease that affects the ovaries and hormone production.  Radiation treatment to the abdomen or pelvis.  Chemotherapy that affects the ovaries. SYMPTOMS   Hot flashes.  Night sweats.  Decrease in sex drive.  Vaginal dryness and thinning of the vagina causing painful intercourse.  Dryness of the skin and developing wrinkles.  Headaches.  Tiredness.  Irritability.  Memory problems.  Weight gain.  Bladder infections.  Hair growth of the face and chest.  Infertility. More serious symptoms include:  Loss of bone (osteoporosis) causing breaks (fractures).  Depression.  Hardening and narrowing of the arteries (atherosclerosis) causing heart attacks and strokes. DIAGNOSIS   When the menstrual periods have stopped for 12 straight months.  Physical exam.  Hormone studies of the blood. TREATMENT  There are many treatment choices and nearly as many questions about them. The  decisions to treat or not to treat menopausal changes is an individual choice made with your health care provider. Your health care provider can discuss the treatments with you. Together, you can decide which treatment will work best for you. Your treatment choices may include:   Hormone therapy (estrogen and progesterone).  Non-hormonal medicines.  Treating the individual symptoms with medicine (for example antidepressants for depression).  Herbal medicines that may help specific symptoms.  Counseling by a psychiatrist or psychologist.  Group therapy.  Lifestyle changes including:  Eating healthy.  Regular exercise.  Limiting caffeine and alcohol.  Stress management and meditation.  No treatment. HOME CARE INSTRUCTIONS   Take the medicine your health care provider gives you as directed.  Get plenty of sleep and rest.  Exercise regularly.  Eat a diet that contains calcium (good for the bones) and soy products (acts like estrogen hormone).  Avoid alcoholic beverages.  Do not smoke.  If you have hot flashes, dress in layers.  Take supplements, calcium, and vitamin D to strengthen bones.  You can use over-the-counter lubricants or moisturizers for vaginal dryness.  Group therapy is sometimes very helpful.  Acupuncture may be helpful in some cases. SEEK MEDICAL CARE IF:   You are not sure you are in menopause.  You are having menopausal symptoms and need advice and treatment.  You are still having menstrual periods after age 55 years.  You have pain with intercourse.  Menopause is complete (no menstrual period for 12 months) and you develop vaginal bleeding.  You need a referral to a specialist (gynecologist, psychiatrist, or psychologist) for treatment. SEEK IMMEDIATE MEDICAL CARE IF:   You have severe depression.  You have excessive vaginal bleeding.    You fell and think you have a broken bone.  You have pain when you urinate.  You develop leg or  chest pain.  You have a fast pounding heart beat (palpitations).  You have severe headaches.  You develop vision problems.  You feel a lump in your breast.  You have abdominal pain or severe indigestion.   This information is not intended to replace advice given to you by your health care provider. Make sure you discuss any questions you have with your health care provider.   Document Released: 01/28/2004 Document Revised: 07/10/2013 Document Reviewed: 06/06/2013 Elsevier Interactive Patient Education 2016 [employer]  

## 2016-06-23 NOTE — Progress Notes (Signed)
Patient is here for FU Perimenopausal  Patient denies pain at this time.  Patient has taken BP med today and patient has eaten.

## 2016-08-03 ENCOUNTER — Emergency Department (HOSPITAL_COMMUNITY)
Admission: EM | Admit: 2016-08-03 | Discharge: 2016-08-03 | Disposition: A | Discharge status: Home / Self Care | Payer: Self-pay | Attending: Emergency Medicine | Admitting: Emergency Medicine

## 2016-08-03 ENCOUNTER — Encounter (HOSPITAL_COMMUNITY): Payer: Self-pay

## 2016-08-03 ENCOUNTER — Emergency Department (HOSPITAL_COMMUNITY): Payer: Self-pay

## 2016-08-03 DIAGNOSIS — N201 Calculus of ureter: Secondary | ICD-10-CM

## 2016-08-03 DIAGNOSIS — I10 Essential (primary) hypertension: Secondary | ICD-10-CM | POA: Insufficient documentation

## 2016-08-03 DIAGNOSIS — N200 Calculus of kidney: Secondary | ICD-10-CM

## 2016-08-03 DIAGNOSIS — N202 Calculus of kidney with calculus of ureter: Secondary | ICD-10-CM | POA: Insufficient documentation

## 2016-08-03 DIAGNOSIS — Z7951 Long term (current) use of inhaled steroids: Secondary | ICD-10-CM | POA: Insufficient documentation

## 2016-08-03 DIAGNOSIS — Z79899 Other long term (current) drug therapy: Secondary | ICD-10-CM | POA: Insufficient documentation

## 2016-08-03 HISTORY — DX: Irritable bowel syndrome, unspecified: K58.9

## 2016-08-03 LAB — CBC
HCT: 40.6 % (ref 36.0–46.0)
HEMOGLOBIN: 13.4 g/dL (ref 12.0–15.0)
MCH: 31.3 pg (ref 26.0–34.0)
MCHC: 33 g/dL (ref 30.0–36.0)
MCV: 94.9 fL (ref 78.0–100.0)
PLATELETS: 337 10*3/uL (ref 150–400)
RBC: 4.28 MIL/uL (ref 3.87–5.11)
RDW: 13.9 % (ref 11.5–15.5)
WBC: 7.2 10*3/uL (ref 4.0–10.5)

## 2016-08-03 LAB — COMPREHENSIVE METABOLIC PANEL
ALK PHOS: 86 U/L (ref 38–126)
ALT: 37 U/L (ref 14–54)
ANION GAP: 9 (ref 5–15)
AST: 30 U/L (ref 15–41)
Albumin: 4.5 g/dL (ref 3.5–5.0)
BILIRUBIN TOTAL: 0.4 mg/dL (ref 0.3–1.2)
BUN: 18 mg/dL (ref 6–20)
CALCIUM: 11.8 mg/dL — AB (ref 8.9–10.3)
CO2: 24 mmol/L (ref 22–32)
CREATININE: 0.97 mg/dL (ref 0.44–1.00)
Chloride: 106 mmol/L (ref 101–111)
GFR calc non Af Amer: >60 mL/min (ref >60)
Glucose, Bld: 100 mg/dL — ABNORMAL HIGH (ref 65–99)
Potassium: 3.3 mmol/L — ABNORMAL LOW (ref 3.5–5.1)
Sodium: 139 mmol/L (ref 135–145)
TOTAL PROTEIN: 8.4 g/dL — AB (ref 6.5–8.1)

## 2016-08-03 LAB — LIPASE, BLOOD: Lipase: 21 U/L (ref 11–51)

## 2016-08-03 LAB — URINALYSIS, ROUTINE W REFLEX MICROSCOPIC
BILIRUBIN URINE: NEGATIVE
Glucose, UA: NEGATIVE mg/dL
HGB URINE DIPSTICK: NEGATIVE
Ketones, ur: NEGATIVE mg/dL
Leukocytes, UA: NEGATIVE
NITRITE: NEGATIVE
PROTEIN: NEGATIVE mg/dL
SPECIFIC GRAVITY, URINE: 1.023 (ref 1.005–1.030)
pH: 7.5 (ref 5.0–8.0)

## 2016-08-03 LAB — POC URINE PREG, ED: PREG TEST UR: NEGATIVE

## 2016-08-03 MED ORDER — ONDANSETRON 4 MG PO TBDP: 4.0000 mg | ORAL_TABLET | Freq: Three times a day (TID) | ORAL | 0 refills | Status: DC | PRN

## 2016-08-03 MED ORDER — TAMSULOSIN HCL 0.4 MG PO CAPS: 0.4000 mg | ORAL_CAPSULE | Freq: Every day | ORAL | 0 refills | Status: DC

## 2016-08-03 MED ORDER — OXYCODONE HCL 5 MG PO TABS: 5.0000 mg | ORAL_TABLET | ORAL | 0 refills | Status: DC | PRN

## 2016-08-03 MED ORDER — SODIUM CHLORIDE 0.9 % IV BOLUS (SEPSIS)
1000.0000 mL | Freq: Once | INTRAVENOUS | Status: AC
  Administered 2016-08-03: 1000 mL via INTRAVENOUS

## 2016-08-03 MED ORDER — OXYCODONE HCL 5 MG PO TABS
5.0000 mg | ORAL_TABLET | Freq: Once | ORAL | Status: AC
  Administered 2016-08-03: 5 mg via ORAL
  Filled 2016-08-03: qty 1

## 2016-08-03 MED ORDER — KETOROLAC TROMETHAMINE 30 MG/ML IJ SOLN
30.0000 mg | Freq: Once | INTRAMUSCULAR | Status: AC
  Administered 2016-08-03: 30 mg via INTRAVENOUS
  Filled 2016-08-03: qty 1

## 2016-08-03 MED ORDER — MORPHINE SULFATE (PF) 4 MG/ML IV SOLN
4.0000 mg | INTRAVENOUS | Status: DC | PRN
  Administered 2016-08-03: 4 mg via INTRAVENOUS
  Filled 2016-08-03: qty 1

## 2016-08-03 MED ORDER — TAMSULOSIN HCL 0.4 MG PO CAPS
0.4000 mg | ORAL_CAPSULE | Freq: Once | ORAL | Status: AC
Start: 2016-08-03 — End: 2016-08-03
  Administered 2016-08-03: 0.4 mg via ORAL
  Filled 2016-08-03: qty 1

## 2016-08-03 MED ORDER — ONDANSETRON HCL 4 MG/2ML IJ SOLN
4.0000 mg | Freq: Once | INTRAMUSCULAR | Status: AC
  Administered 2016-08-03: 4 mg via INTRAVENOUS
  Filled 2016-08-03: qty 2

## 2016-08-03 NOTE — ED Provider Notes (Signed)
[facility] DEPT Provider Note   CSN: [id number] Arrival date & time: 08/03/16  1545     History   Chief Complaint Chief Complaint  Patient presents with  . Abdominal Pain    HPI Katelyn Hardin is a 45 y.o. female.  She presents with rather sudden onset of right mid abdominal and lower abdominal pain mid afternoon today. States she had an episode on Saturday night, 4 days ago that lasted about 45 minutes and resolved. She rates her pain of 10 over 10 and worse than labor. Radius right groin. No dysuria hematuria or other irritant voiding symptoms over the last few days. Mild nausea today no vomiting. Has a history of previous episodes of pain. Underwent a CT scan. She was told it was normal and that she had "IBS". He reviewed the CT scan dated January 2016 she had a solitary 2 mm right renal calculus  HPI  Past Medical History:  Diagnosis Date  . Anemia   . Bacterial vaginosis   . Fibroids   . Fibromyalgia   . Hypertension   . IBS (irritable bowel syndrome)     Patient Active Problem List   Diagnosis Date Noted  . Obesity 06/23/2016  . Screening for breast cancer 06/23/2016  . Right ear pain 12/30/2015  . 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 Bad odor of urine 12/04/2014  . Epigastric pain 12/04/2014  . Uncontrolled hypertension 12/04/2014  . Malodorous urine 07/07/2014  . Menopausal hot flushes 07/07/2014  . 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 Insomnia 09/02/2013  . Fibroid 09/02/2013  . 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 Benign paroxysmal positional vertigo 07/02/2013  . Dysmenorrhea 08/17/2012  . Menorrhagia 08/17/2012  . DEPRESSION 07/21/2010  . HYPERTENSION, BENIGN ESSENTIAL 07/21/2010  . UNSPECIFIED VITAMIN D DEFICIENCY 01/05/2010  . OVERWEIGHT 04/29/2008  . ANEMIA 04/29/2008  . FATIGUE 04/29/2008  . PSORIASIS 04/04/2008  . IBS 03/10/2008  . Other specified disorders of bladder 03/10/2008  . VAGINITIS, BACTERIAL, RECURRENT 03/10/2008    Past  Surgical History:  Procedure Laterality Date  . TUBAL LIGATION    . TUBAL LIGATION      OB History    Gravida Para Term Preterm AB Living   3 2 2   1 2    SAB TAB Ectopic Multiple Live Births   1 0 0 0         Home Medications    Prior to Admission medications   Medication Sig Start Date End Date Taking? Authorizing Provider  cetirizine (ZYRTEC) 10 MG tablet Take 1 tablet (10 mg total) by mouth daily. 12/30/15   Willeen Niece, MD  clotrimazole-betamethasone (LOTRISONE) cream Apply 1 application topically 2 (two) times daily. 01/21/16   Tresa Garter, MD  fluticasone (FLONASE) 50 MCG/ACT nasal spray Place 2 sprays into both nostrils daily. 12/30/15   Willeen Niece, MD  hydrochlorothiazide (HYDRODIURIL) 25 MG tablet Take 1 tablet (25 mg total) by mouth daily. 06/03/16   Argentina Donovan, PA-C  lisinopril (PRINIVIL,ZESTRIL) 10 MG tablet Take 1 tablet (10 mg total) by mouth daily. 06/03/16   Argentina Donovan, PA-C  meclizine (ANTIVERT) 25 MG tablet Take 1 tablet (25 mg total) by mouth 3 (three) times daily as needed for dizziness. 06/23/16   Tresa Garter, MD  Multiple Vitamin (MULTIVITAMIN WITH MINERALS) TABS tablet Take 1 tablet by mouth daily. 06/23/16   Tresa Garter, MD  Norgestimate-Ethinyl Estradiol Triphasic 0.18/0.215/0.25 MG-25 MCG tab Take 1 tablet by mouth daily. Patient not taking: Reported on  06/23/2016 01/22/14   Kassie Mends, MD  ondansetron (ZOFRAN ODT) 4 MG disintegrating tablet Take 1 tablet (4 mg total) by mouth every 8 (eight) hours as needed for nausea. 08/03/16   Tanna Furry, MD  oxyCODONE (ROXICODONE) 5 MG immediate release tablet Take 1 tablet (5 mg total) by mouth every 4 (four) hours as needed for severe pain. 08/03/16   Tanna Furry, MD  PARoxetine (PAXIL) 40 MG tablet Take 1 tablet (40 mg total) by mouth daily. 06/23/16 06/23/17  Tresa Garter, MD  ramelteon (ROZEREM) 8 MG tablet Take 1 tablet (8 mg total) by mouth at bedtime. 06/23/16   Tresa Garter, MD    tamsulosin (FLOMAX) 0.4 MG CAPS capsule Take 1 capsule (0.4 mg total) by mouth daily. 08/03/16   Tanna Furry, MD    Family History Family History  Problem Relation Age of Onset  . Thyroid disease Sister   . Cancer Maternal Aunt     breast    Social History Social History  Substance Use Topics  . Smoking status: Never Smoker  . Smokeless tobacco: Never Used  . Alcohol use 3.6 oz/week    2 Glasses of wine, 4 Standard drinks or equivalent per week     Comment: 2 glasses of wine daily     Allergies   Acetaminophen; Codeine; Oxycodone-acetaminophen; and Vicodin [hydrocodone-acetaminophen]   Review of Systems Review of Systems  Constitutional: Negative for appetite change, chills, diaphoresis, fatigue and fever.  HENT: Negative for mouth sores, sore throat and trouble swallowing.   Eyes: Negative for visual disturbance.  Respiratory: Negative for cough, chest tightness, shortness of breath and wheezing.   Cardiovascular: Negative for chest pain.  Gastrointestinal: Positive for abdominal pain and nausea. Negative for abdominal distention, diarrhea and vomiting.  Endocrine: Negative for polydipsia, polyphagia and polyuria.  Genitourinary: Positive for flank pain. Negative for dysuria, frequency and hematuria.  Musculoskeletal: Negative for gait problem.  Skin: Negative for color change, pallor and rash.  Neurological: Negative for dizziness, syncope, light-headedness and headaches.  Hematological: Does not bruise/bleed easily.  Psychiatric/Behavioral: Negative for behavioral problems and confusion.     Physical Exam Updated Vital Signs BP 160/98 (BP Location: Right Arm)   Pulse (!) 58   Temp 97.6 F (36.4 C) (Oral)   Resp 18   SpO2 100%   Physical Exam  Constitutional: She is oriented to person, place, and time. She appears well-developed and well-nourished. No distress.  45 year old African-American female. Distressed secondary to her pain. Awake and alert.  HENT:   Head: Normocephalic.  Eyes: Conjunctivae are normal. Pupils are equal, round, and reactive to light. No scleral icterus.  Neck: Normal range of motion. Neck supple. No thyromegaly present.  Cardiovascular: Normal rate and regular rhythm.  Exam reveals no gallop and no friction rub.   No murmur heard. Pulmonary/Chest: Effort normal and breath sounds normal. No respiratory distress. She has no wheezes. She has no rales.  Abdominal: Soft. Bowel sounds are normal. She exhibits no distension. There is no tenderness. There is no rebound.    Musculoskeletal: Normal range of motion.  Neurological: She is alert and oriented to person, place, and time.  Skin: Skin is warm and dry. No rash noted.  Psychiatric: She has a normal mood and affect. Her behavior is normal.     ED Treatments / Results  Labs (all labs ordered are listed, but only abnormal results are displayed) Labs Reviewed  COMPREHENSIVE METABOLIC PANEL - Abnormal; Notable for the following:  Result Value   Potassium 3.3 (*)    Glucose, Bld 100 (*)    Calcium 11.8 (*)    Total Protein 8.4 (*)    All other components within normal limits  URINALYSIS, ROUTINE W REFLEX MICROSCOPIC (NOT AT [facility]) - Abnormal; Notable for the following:    APPearance CLOUDY (*)    All other components within normal limits  LIPASE, BLOOD  CBC  POC URINE PREG, ED    EKG  EKG Interpretation None       Radiology Ct Renal Stone Study  Result Date: 08/03/2016 CLINICAL DATA:  Mid to low abdominal pain, intense EXAM: CT ABDOMEN AND PELVIS WITHOUT CONTRAST TECHNIQUE: Multidetector CT imaging of the abdomen and pelvis was performed following the standard protocol without IV contrast. COMPARISON:  CT 12/09/2014 FINDINGS: Lower chest: Visualized lung bases are clear. No significant effusion or focal consolidations. Hepatobiliary: Noncontrast appearance of the liver is within normal limits. No calcified stones in the gallbladder. No biliary  dilatation. Pancreas: Unremarkable. No pancreatic ductal dilatation or surrounding inflammatory changes. Spleen: Normal in size without focal abnormality. Adrenals/Urinary Tract: Adrenal glands are within normal limits. No focal calcifications identified within left kidney. No left hydronephrosis. There is mild to moderate right hydro nephrosis and hydroureter. This is secondary to a 3-4 mm stone at the right UVJ. This likely represents migration of the previously noted lower pole stone on the right. No additional right intrarenal calcifications are identified. Stomach/Bowel: No dilated bowel. Stomach is nondistended. There is transverse lie of the cecum on today's examination. Vascular/Lymphatic: Minimal atherosclerosis of the aorta. No pathologically enlarged retroperitoneal or mesenteric lymph nodes. Reproductive: Lobulated contour of the uterus presumably secondary to fibroids. Stable 4.3 cm exophytic mass arising from the left upper aspect of the uterus presumably a large fibroid, unchanged. Multiple calcified phleboliths. Other: No free air or free fluid. Small fatty containing periumbilical hernia again demonstrated Musculoskeletal: No acute osseous abnormality IMPRESSION: 1. Mild to moderate right-sided hydronephrosis and hydroureter secondary to a 3 to 4 mm stone at the right UVJ. 2. Normal noncontrast appearance of the left kidney Electronically Signed   By: Donavan Foil M.D.   On: 08/03/2016 19:04    Procedures Procedures (including critical care time)  Medications Ordered in ED Medications  morphine 4 MG/ML injection 4 mg (4 mg Intravenous Given 08/03/16 1850)  tamsulosin (FLOMAX) capsule 0.4 mg (not administered)  oxyCODONE (Oxy IR/ROXICODONE) immediate release tablet 5 mg (not administered)  ondansetron (ZOFRAN) injection 4 mg (4 mg Intravenous Given 08/03/16 1850)  ketorolac (TORADOL) 30 MG/ML injection 30 mg (30 mg Intravenous Given 08/03/16 1850)  sodium chloride 0.9 % bolus 1,000 mL  (1,000 mLs Intravenous New Bag/Given 08/03/16 1851)     Initial Impression / Assessment and Plan / ED Course  I have reviewed the triage vital signs and the nursing notes.  Pertinent labs & imaging results that were available during my care of the patient were reviewed by me and considered in my medical decision making (see chart for details).  Clinical Course    Patient given IV morphine, Toradol, Zofran, IV fluids. Given by mouth Flomax 0.4. CT scan shows 4 mm UVJ stone with hydronephrosis. Her kidney function is intact. Not infected. She is tolerating symptoms well. Given first dose by mouth pain meds here. Discussed follow-up with her primary care and or urology in the next 5-7 days if not improving. All questions answered. Patient discharged home stable tolerating symptoms well.  Final Clinical Impressions(s) / ED Diagnoses  Final diagnoses:  Ureteral stone  Kidney stone    New Prescriptions New Prescriptions   ONDANSETRON (ZOFRAN ODT) 4 MG DISINTEGRATING TABLET    Take 1 tablet (4 mg total) by mouth every 8 (eight) hours as needed for nausea.   OXYCODONE (ROXICODONE) 5 MG IMMEDIATE RELEASE TABLET    Take 1 tablet (5 mg total) by mouth every 4 (four) hours as needed for severe pain.   TAMSULOSIN (FLOMAX) 0.4 MG CAPS CAPSULE    Take 1 capsule (0.4 mg total) by mouth daily.     Tanna Furry, MD 08/03/16 [phone]

## 2016-08-03 NOTE — Discharge Instructions (Signed)
Push fluids stay hydrated.  Call [facility] for appointment if your symptoms last more than 5 days.  Flomax once per day to help the stone pass more easily.  Oxycodone for pain

## 2016-08-03 NOTE — ED Notes (Signed)
Pt given strainer to monitor for stones being passed.

## 2016-08-03 NOTE — ED Notes (Signed)
Pt ambulatory and independent at discharge.  Verbalized understanding of discharge instructions and importance of not driving while under the influence of pain medications. 

## 2016-08-03 NOTE — ED Triage Notes (Addendum)
BIB EMS w/ 10/10 RLQ abd pain w/o n/v. Pt states pain started 44min ago. Pt reports hx of IBS. Pt denies hx of abd surgery. Pt A+OX4.

## 2016-08-11 ENCOUNTER — Ambulatory Visit: Payer: Self-pay | Attending: Internal Medicine | Admitting: Internal Medicine

## 2016-08-11 ENCOUNTER — Encounter: Payer: Self-pay | Admitting: Internal Medicine

## 2016-08-11 VITALS — BP 148/88 | HR 70 | Temp 98.6°F | Resp 18 | Ht 66.0 in | Wt 194.0 lb

## 2016-08-11 DIAGNOSIS — K589 Irritable bowel syndrome without diarrhea: Secondary | ICD-10-CM | POA: Insufficient documentation

## 2016-08-11 DIAGNOSIS — M797 Fibromyalgia: Secondary | ICD-10-CM | POA: Insufficient documentation

## 2016-08-11 DIAGNOSIS — Z780 Asymptomatic menopausal state: Secondary | ICD-10-CM | POA: Insufficient documentation

## 2016-08-11 DIAGNOSIS — Z888 Allergy status to other drugs, medicaments and biological substances status: Secondary | ICD-10-CM | POA: Insufficient documentation

## 2016-08-11 DIAGNOSIS — E669 Obesity, unspecified: Secondary | ICD-10-CM | POA: Insufficient documentation

## 2016-08-11 DIAGNOSIS — F329 Major depressive disorder, single episode, unspecified: Secondary | ICD-10-CM | POA: Insufficient documentation

## 2016-08-11 DIAGNOSIS — N951 Menopausal and female climacteric states: Secondary | ICD-10-CM | POA: Insufficient documentation

## 2016-08-11 DIAGNOSIS — Z79899 Other long term (current) drug therapy: Secondary | ICD-10-CM | POA: Insufficient documentation

## 2016-08-11 DIAGNOSIS — Z0000 Encounter for general adult medical examination without abnormal findings: Secondary | ICD-10-CM

## 2016-08-11 DIAGNOSIS — I10 Essential (primary) hypertension: Secondary | ICD-10-CM | POA: Insufficient documentation

## 2016-08-11 DIAGNOSIS — H8110 Benign paroxysmal vertigo, unspecified ear: Secondary | ICD-10-CM | POA: Insufficient documentation

## 2016-08-11 DIAGNOSIS — D649 Anemia, unspecified: Secondary | ICD-10-CM | POA: Insufficient documentation

## 2016-08-11 DIAGNOSIS — Z124 Encounter for screening for malignant neoplasm of cervix: Secondary | ICD-10-CM | POA: Insufficient documentation

## 2016-08-11 MED ORDER — ESTROGENS, CONJUGATED 0.625 MG/GM VA CREA: 1.0000 | TOPICAL_CREAM | Freq: Every day | VAGINAL | 12 refills | Status: DC

## 2016-08-11 NOTE — Patient Instructions (Signed)
Menopause Menopause is the normal time of life when menstrual periods stop completely. Menopause is complete when you have missed 12 consecutive menstrual periods. It usually occurs between the ages of 48 years and 55 years. Very rarely does a woman develop menopause before the age of 40 years. At menopause, your ovaries stop producing the female hormones estrogen and progesterone. This can cause undesirable symptoms and also affect your health. Sometimes the symptoms may occur 4-5 years before the menopause begins. There is no relationship between menopause and:  Oral contraceptives.  Number of children you had.  Race.  The age your menstrual periods started (menarche). Heavy smokers and very thin women may develop menopause earlier in life. CAUSES  The ovaries stop producing the female hormones estrogen and progesterone.  Other causes include:  Surgery to remove both ovaries.  The ovaries stop functioning for no known reason.  Tumors of the pituitary gland in the brain.  Medical disease that affects the ovaries and hormone production.  Radiation treatment to the abdomen or pelvis.  Chemotherapy that affects the ovaries. SYMPTOMS   Hot flashes.  Night sweats.  Decrease in sex drive.  Vaginal dryness and thinning of the vagina causing painful intercourse.  Dryness of the skin and developing wrinkles.  Headaches.  Tiredness.  Irritability.  Memory problems.  Weight gain.  Bladder infections.  Hair growth of the face and chest.  Infertility. More serious symptoms include:  Loss of bone (osteoporosis) causing breaks (fractures).  Depression.  Hardening and narrowing of the arteries (atherosclerosis) causing heart attacks and strokes. DIAGNOSIS   When the menstrual periods have stopped for 12 straight months.  Physical exam.  Hormone studies of the blood. TREATMENT  There are many treatment choices and nearly as many questions about them. The  decisions to treat or not to treat menopausal changes is an individual choice made with your health care provider. Your health care provider can discuss the treatments with you. Together, you can decide which treatment will work best for you. Your treatment choices may include:   Hormone therapy (estrogen and progesterone).  Non-hormonal medicines.  Treating the individual symptoms with medicine (for example antidepressants for depression).  Herbal medicines that may help specific symptoms.  Counseling by a psychiatrist or psychologist.  Group therapy.  Lifestyle changes including:  Eating healthy.  Regular exercise.  Limiting caffeine and alcohol.  Stress management and meditation.  No treatment. HOME CARE INSTRUCTIONS   Take the medicine your health care provider gives you as directed.  Get plenty of sleep and rest.  Exercise regularly.  Eat a diet that contains calcium (good for the bones) and soy products (acts like estrogen hormone).  Avoid alcoholic beverages.  Do not smoke.  If you have hot flashes, dress in layers.  Take supplements, calcium, and vitamin D to strengthen bones.  You can use over-the-counter lubricants or moisturizers for vaginal dryness.  Group therapy is sometimes very helpful.  Acupuncture may be helpful in some cases. SEEK MEDICAL CARE IF:   You are not sure you are in menopause.  You are having menopausal symptoms and need advice and treatment.  You are still having menstrual periods after age 55 years.  You have pain with intercourse.  Menopause is complete (no menstrual period for 12 months) and you develop vaginal bleeding.  You need a referral to a specialist (gynecologist, psychiatrist, or psychologist) for treatment. SEEK IMMEDIATE MEDICAL CARE IF:   You have severe depression.  You have excessive vaginal bleeding.    You fell and think you have a broken bone.  You have pain when you urinate.  You develop leg or  chest pain.  You have a fast pounding heart beat (palpitations).  You have severe headaches.  You develop vision problems.  You feel a lump in your breast.  You have abdominal pain or severe indigestion.   This information is not intended to replace advice given to you by your health care provider. Make sure you discuss any questions you have with your health care provider.   Document Released: 01/28/2004 Document Revised: 07/10/2013 Document Reviewed: 06/06/2013 Elsevier Interactive Patient Education 2016 [employer]  

## 2016-08-11 NOTE — Progress Notes (Signed)
Patient is here for Physical and Pap smear  Patient denies pain at this time.  Patient has taken medication today (8:30am) and patient has not eaten today.  Patient requested STD screening and hormone blood work for menopause.  Patient would like her flu vaccine today.

## 2016-08-11 NOTE — Progress Notes (Signed)
Katelyn Hardin, is a 45 y.o. female  [id number]  [id number]  DOB - [birth year]  Chief Complaint  Patient presents with  . Annual Exam        Subjective:   Katelyn Hardin is a 45 y.o. female with history of hypertension, IBS, fibromyalgia, chronic anemia, benign positional vertigo, obesity and depression here today for pap smear and STD check. Patient has no complaint today. She claims her depression is getting better but she desires to have better sexual arousal because she currently have low sexual drive. She also have vaginal dryness from menopause. Patient has No headache, No chest pain, No abdominal pain - No Nausea, No new weakness tingling or numbness, No Cough - SOB.  Problem  Vaginal Dryness, Menopausal  Healthcare Maintenance    ALLERGIES: Allergies  Allergen Reactions  . Acetaminophen Other (See Comments)    "Bothers her- jittery and nausea."  . Codeine     Upset stomach  . Oxycodone-Acetaminophen     Upset stomach  . Vicodin [Hydrocodone-Acetaminophen] Nausea Only    PAST MEDICAL HISTORY: Past Medical History:  Diagnosis Date  . Anemia   . Bacterial vaginosis   . Fibroids   . Fibromyalgia   . Hypertension   . IBS (irritable bowel syndrome)     MEDICATIONS AT HOME: Prior to Admission medications   Medication Sig Start Date End Date Taking? Authorizing Provider  cetirizine (ZYRTEC) 10 MG tablet Take 1 tablet (10 mg total) by mouth daily. 12/30/15  Yes Willeen Niece, MD  clotrimazole-betamethasone (LOTRISONE) cream Apply 1 application topically 2 (two) times daily. 01/21/16  Yes Tresa Garter, MD  fluticasone (FLONASE) 50 MCG/ACT nasal spray Place 2 sprays into both nostrils daily. 12/30/15  Yes Willeen Niece, MD  hydrochlorothiazide (HYDRODIURIL) 25 MG tablet Take 1 tablet (25 mg total) by mouth daily. 06/03/16  Yes Dionne Bucy McClung, PA-C  lisinopril (PRINIVIL,ZESTRIL) 10 MG tablet Take 1 tablet (10 mg total) by mouth daily. 06/03/16   Yes Argentina Donovan, PA-C  meclizine (ANTIVERT) 25 MG tablet Take 1 tablet (25 mg total) by mouth 3 (three) times daily as needed for dizziness. 06/23/16  Yes Tresa Garter, MD  Multiple Vitamin (MULTIVITAMIN WITH MINERALS) TABS tablet Take 1 tablet by mouth daily. 06/23/16  Yes Tresa Garter, MD  ondansetron (ZOFRAN ODT) 4 MG disintegrating tablet Take 1 tablet (4 mg total) by mouth every 8 (eight) hours as needed for nausea. 08/03/16  Yes Tanna Furry, MD  oxyCODONE (ROXICODONE) 5 MG immediate release tablet Take 1 tablet (5 mg total) by mouth every 4 (four) hours as needed for severe pain. 08/03/16  Yes Tanna Furry, MD  PARoxetine (PAXIL) 40 MG tablet Take 1 tablet (40 mg total) by mouth daily. 06/23/16 06/23/17 Yes Taevyn Hausen Essie Christine, MD  ramelteon (ROZEREM) 8 MG tablet Take 1 tablet (8 mg total) by mouth at bedtime. 06/23/16  Yes Tresa Garter, MD  tamsulosin (FLOMAX) 0.4 MG CAPS capsule Take 1 capsule (0.4 mg total) by mouth daily. 08/03/16  Yes Tanna Furry, MD  conjugated estrogens (PREMARIN) vaginal cream Place 1 Applicatorful vaginally daily. 08/11/16   Tresa Garter, MD     Objective:   Vitals:   08/11/16 0916  BP: (!) 148/88  Pulse: 70  Resp: 18  Temp: 98.6 F (37 C)  TempSrc: Oral  SpO2: 100%  Weight: 194 lb (88 kg)  Height: 5\' 6"  (1.676 m)    Exam General appearance : Awake, alert, not in any  distress. Speech Clear. Not toxic looking HEENT: Atraumatic and Normocephalic, pupils equally reactive to light and accomodation Neck: Supple, no JVD. No cervical lymphadenopathy.  Chest: Good air entry bilaterally, no added sounds  CVS: S1 S2 regular, no murmurs.  Abdomen: Bowel sounds present, Non tender and not distended with no gaurding, rigidity or rebound. Extremities: B/L Lower Ext shows no edema, both legs are warm to touch Neurology: Awake alert, and oriented X 3, CN II-XII intact, Non focal Skin: No Rash  Pelvic Exam: Cervix normal in appearance, external  genitalia normal, no adnexal masses or tenderness, no cervical motion tenderness, rectovaginal septum normal, uterus normal size, shape, and consistency and vagina normal without discharge   Data Review Lab Results  Component Value Date   HGBA1C 5.10 12/04/2014   HGBA1C 5.0 02/22/2013   Dictation #1 [id number]  [id number]   Assessment & Plan   1. Healthcare maintenance  - Flu Vaccine QUAD 36+ mos PF IM (Fluarix & Fluzone Quad PF)  2. Essential hypertension, benign  We have discussed target BP range and blood pressure goal. I have advised patient to check BP regularly and to call us back or report to clinic if the numbers are consistently higher than 140/90. We discussed the importance of compliance with medical therapy and DASH diet recommended, consequences of uncontrolled hypertension discussed.  - continue current BP medications  3. Vaginal dryness, menopausal  - conjugated estrogens (PREMARIN) vaginal cream; Place 1 Applicatorful vaginally daily.  Dispense: 42.5 g; Refill: 12  4. Pap smear for cervical cancer screening - Cytology - PAP - Cervicovaginal ancillary only - HIV antibody (with reflex) - Acute Hep Panel & Hep B Surface Ab - RPR  Patient have been counseled extensively about nutrition and exercise  Return in about 6 months (around 02/08/2017) for Follow up HTN, Follow up Pain and comorbidities.  The patient was given clear instructions to go to ER or return to medical center if symptoms don't improve, worsen or new problems develop. The patient verbalized understanding. The patient was told to call to get lab results if they haven't heard anything in the next week.   This note has been created with Surveyor, quantity. Any transcriptional errors are unintentional.    Angelica Chessman, MD, [place], Karilyn Cota, [place] and [place], [place]   08/11/2016, 9:56 AM

## 2016-08-12 ENCOUNTER — Other Ambulatory Visit: Payer: Self-pay | Admitting: Pharmacist

## 2016-08-12 LAB — RPR

## 2016-08-12 LAB — CERVICOVAGINAL ANCILLARY ONLY
CHLAMYDIA, DNA PROBE: NEGATIVE
Neisseria Gonorrhea: NEGATIVE
Wet Prep (BD Affirm): POSITIVE — AB

## 2016-08-12 LAB — HEPATITIS B SURFACE ANTIBODY,QUALITATIVE: Hep B S Ab: NEGATIVE

## 2016-08-12 LAB — HIV ANTIBODY (ROUTINE TESTING W REFLEX): HIV 1&2 Ab, 4th Generation: NONREACTIVE

## 2016-08-12 LAB — HEPATITIS PANEL, ACUTE
HCV Ab: NEGATIVE
HEP A IGM: NONREACTIVE
Hep B C IgM: NONREACTIVE
Hepatitis B Surface Ag: NEGATIVE

## 2016-08-12 LAB — CYTOLOGY - PAP

## 2016-08-12 MED ORDER — ESTRADIOL 0.1 MG/GM VA CREA: 1.0000 | TOPICAL_CREAM | Freq: Every day | VAGINAL | 12 refills | Status: DC

## 2016-08-12 NOTE — Progress Notes (Incomplete)
ESTR

## 2016-08-16 ENCOUNTER — Other Ambulatory Visit: Payer: Self-pay | Admitting: Internal Medicine

## 2016-08-16 ENCOUNTER — Telehealth (INDEPENDENT_AMBULATORY_CARE_PROVIDER_SITE_OTHER): Payer: Self-pay | Admitting: *Deleted

## 2016-08-16 DIAGNOSIS — B9689 Other specified bacterial agents as the cause of diseases classified elsewhere: Secondary | ICD-10-CM

## 2016-08-16 DIAGNOSIS — R399 Unspecified symptoms and signs involving the genitourinary system: Secondary | ICD-10-CM

## 2016-08-16 DIAGNOSIS — N760 Acute vaginitis: Principal | ICD-10-CM

## 2016-08-16 LAB — CERVICOVAGINAL ANCILLARY ONLY: Herpes: NEGATIVE

## 2016-08-16 MED ORDER — METRONIDAZOLE 500 MG PO TABS: 500.0000 mg | ORAL_TABLET | Freq: Two times a day (BID) | ORAL | 0 refills | Status: AC

## 2016-08-16 NOTE — Telephone Encounter (Signed)
Patient verified DOB  

## 2016-08-16 NOTE — Telephone Encounter (Signed)
-----   Message from Tresa Garter, MD sent at 08/16/2016  9:41 AM EDT ----- Please inform patient that her STD checks are all negative, HIV and Hepatitis negative as of 08/12/2016. Pap smear is negative for malignancy. There is a presence of bacterial vaginosis, not a sexually transmitted bacteria, treatable with antibiotics. Avoid excessive douching. Flagyl prescribed to the pharmacy for pick-up. Avoid alcohol while taking flagyl.

## 2016-08-17 NOTE — Telephone Encounter (Signed)
MA spoke with patient and apologized for the mix up. Patient will return this week to provide a urine sample.  Sample will be dipped to assess for UTI. No further questions at this time.

## 2016-08-17 NOTE — Telephone Encounter (Signed)
Pt came into office requesting nurse, states she was needing urine sample, pt would like a f/up call.

## 2016-08-22 ENCOUNTER — Telehealth: Payer: Self-pay | Admitting: *Deleted

## 2016-08-22 ENCOUNTER — Ambulatory Visit: Payer: Self-pay | Attending: Internal Medicine

## 2016-08-22 DIAGNOSIS — R6889 Other general symptoms and signs: Secondary | ICD-10-CM | POA: Insufficient documentation

## 2016-08-22 LAB — POCT URINALYSIS DIPSTICK
GLUCOSE UA: NEGATIVE
KETONES UA: NEGATIVE
NITRITE UA: NEGATIVE
RBC UA: NEGATIVE
Spec Grav, UA: 1.02
Urobilinogen, UA: 0.2
pH, UA: 5.5

## 2016-08-22 MED ORDER — CIPROFLOXACIN HCL 500 MG PO TABS: 500.0000 mg | ORAL_TABLET | Freq: Two times a day (BID) | ORAL | 0 refills | Status: AC

## 2016-08-22 NOTE — Progress Notes (Signed)
Patient here for lab visit only 

## 2016-08-22 NOTE — Telephone Encounter (Signed)
Patient verified DOB Patient is aware of PCP reviewing patients Urine Dipstick results. Patient is aware of Cipro being ordered to the pharmacy. Patient advised to take medication two times a day for three days. Patient expressed her understanding and had no further questions at this time.

## 2016-09-15 ENCOUNTER — Other Ambulatory Visit: Payer: Self-pay

## 2016-09-15 ENCOUNTER — Ambulatory Visit: Payer: Self-pay | Attending: Internal Medicine | Admitting: Physician Assistant

## 2016-09-15 VITALS — BP 124/86 | HR 72 | Temp 98.2°F | Resp 16 | Wt 197.6 lb

## 2016-09-15 DIAGNOSIS — H8113 Benign paroxysmal vertigo, bilateral: Secondary | ICD-10-CM

## 2016-09-15 DIAGNOSIS — R42 Dizziness and giddiness: Secondary | ICD-10-CM

## 2016-09-15 DIAGNOSIS — E876 Hypokalemia: Secondary | ICD-10-CM

## 2016-09-15 DIAGNOSIS — Z79899 Other long term (current) drug therapy: Secondary | ICD-10-CM | POA: Insufficient documentation

## 2016-09-15 DIAGNOSIS — H9201 Otalgia, right ear: Secondary | ICD-10-CM

## 2016-09-15 LAB — BASIC METABOLIC PANEL
BUN: 14 mg/dL (ref 7–25)
CHLORIDE: 105 mmol/L (ref 98–110)
CO2: 26 mmol/L (ref 20–31)
Calcium: 11.5 mg/dL — ABNORMAL HIGH (ref 8.6–10.2)
Creat: 0.9 mg/dL (ref 0.50–1.10)
GLUCOSE: 82 mg/dL (ref 65–99)
POTASSIUM: 3.9 mmol/L (ref 3.5–5.3)
SODIUM: 139 mmol/L (ref 135–146)

## 2016-09-15 LAB — TSH: TSH: 0.85 mIU/L

## 2016-09-15 MED ORDER — FLUTICASONE PROPIONATE 50 MCG/ACT NA SUSP: 2.0000 | Freq: Every day | NASAL | 2 refills | Status: DC

## 2016-09-15 MED ORDER — MECLIZINE HCL 25 MG PO TABS: 25.0000 mg | ORAL_TABLET | Freq: Three times a day (TID) | ORAL | 0 refills | Status: DC | PRN

## 2016-09-15 MED ORDER — RAMELTEON 8 MG PO TABS: 8.0000 mg | ORAL_TABLET | Freq: Every day | ORAL | 3 refills | Status: DC

## 2016-09-15 NOTE — Progress Notes (Signed)
Katelyn Hardin, is a 45 y.o. female  [id number]  [id number]  DOB - 1971/08/07  Subjective:  Chief Complaint and HPI: Katelyn Hardin is a 45 y.o. female here today for 2 week history of dizziness.  The dizziness is worse when she changes positions.  She denies CP/SOB.  No vision changes.  Meclizine hasn't helped much.  She also c/o red eyes for the last couple of weeks.  She has been having to use red-eye reduction drops.  She is having some nasal congestion.  No loss of balance or neurological deficits.  She has had to take flagyl and cipro recently for BV and UTI respectively.  She denies N/V/D.   Recent CPE.  She also c/o fatigue and poor sleep overall.  Sleep study is pending and she is on a trial of Rozerem for sleep.  She was recently prescribed vaginal estrogen but has not started this medication yet.     ROS:   Constitutional:  No f/c, No night sweats, No unexplained weight loss. EENT:  No vision changes, No blurry vision, No hearing changes. No mouth, throat, or ear problems other than mild nasal congestion.  Respiratory: No cough, No SOB Cardiac: No CP, no palpitations GI:  No abd pain, No N/V/D. GU: No Urinary s/sx Musculoskeletal: No joint pain Neuro: No headache, +dizziness, no motor weakness.  Skin: No rash Endocrine:  No polydipsia. No polyuria.  Psych: Denies SI/HI  No problems updated.  ALLERGIES: Allergies  Allergen Reactions  . Acetaminophen Other (See Comments)    "Bothers her- jittery and nausea."  . Codeine     Upset stomach  . Oxycodone-Acetaminophen     Upset stomach  . Vicodin [Hydrocodone-Acetaminophen] Nausea Only    PAST MEDICAL HISTORY: Past Medical History:  Diagnosis Date  . Anemia   . Bacterial vaginosis   . Fibroids   . Fibromyalgia   . Hypertension   . IBS (irritable bowel syndrome)     MEDICATIONS AT HOME: Prior to Admission medications   Medication Sig Start Date End Date Taking? Authorizing Provider    cetirizine (ZYRTEC) 10 MG tablet Take 1 tablet (10 mg total) by mouth daily. 12/30/15  Yes Katelyn Niece, MD  estradiol (ESTRACE) 0.1 MG/GM vaginal cream Place 1 Applicatorful vaginally at bedtime. 08/12/16  Yes Katelyn Garter, MD  hydrochlorothiazide (HYDRODIURIL) 25 MG tablet Take 1 tablet (25 mg total) by mouth daily. 06/03/16  Yes Katelyn Bucy Katelyn Haydon, PA-C  lisinopril (PRINIVIL,ZESTRIL) 10 MG tablet Take 1 tablet (10 mg total) by mouth daily. 06/03/16  Yes Katelyn Donovan, PA-C  Multiple Vitamin (MULTIVITAMIN WITH MINERALS) TABS tablet Take 1 tablet by mouth daily. 06/23/16  Yes Katelyn Garter, MD  PARoxetine (PAXIL) 40 MG tablet Take 1 tablet (40 mg total) by mouth daily. 06/23/16 06/23/17 Yes Katelyn Katelyn Christine, MD  ramelteon (ROZEREM) 8 MG tablet Take 1 tablet (8 mg total) by mouth at bedtime. 06/23/16  Yes Katelyn Garter, MD  clotrimazole-betamethasone (LOTRISONE) cream Apply 1 application topically 2 (two) times daily. Patient not taking: Reported on 09/15/2016 01/21/16   Katelyn Garter, MD  fluticasone (FLONASE) 50 MCG/ACT nasal spray Place 2 sprays into both nostrils daily. Patient not taking: Reported on 09/15/2016 12/30/15   Katelyn Niece, MD  meclizine (ANTIVERT) 25 MG tablet Take 1 tablet (25 mg total) by mouth 3 (three) times daily as needed for dizziness. Patient not taking: Reported on 09/15/2016 06/23/16   Katelyn Garter, MD  ondansetron (ZOFRAN ODT) 4  MG disintegrating tablet Take 1 tablet (4 mg total) by mouth every 8 (eight) hours as needed for nausea. Patient not taking: Reported on 09/15/2016 08/03/16   Katelyn Furry, MD  oxyCODONE (ROXICODONE) 5 MG immediate release tablet Take 1 tablet (5 mg total) by mouth every 4 (four) hours as needed for severe pain. Patient not taking: Reported on 09/15/2016 08/03/16   Katelyn Furry, MD  tamsulosin (FLOMAX) 0.4 MG CAPS capsule Take 1 capsule (0.4 mg total) by mouth daily. Patient not taking: Reported on 09/15/2016 08/03/16   Katelyn Furry,  MD     Objective:  EXAM:   Vitals:   09/15/16 0946  BP: 124/86  Pulse: 72  Resp: 16  Temp: 98.2 F (36.8 C)  TempSrc: Oral  SpO2: 98%  Weight: 197 lb 9.6 oz (89.6 kg)    General appearance : A&OX3. NAD. Non-toxic-appearing HEENT: Atraumatic and Normocephalic.  PERRLA. EOM intact.  TM clear B. Mouth-MMM, post pharynx WNL w/o erythema, No PND.  +Nasal congestion Neck: supple, no JVD. No cervical lymphadenopathy. No thyromegaly Chest/Lungs:  Breathing-non-labored, Good air entry bilaterally, breath sounds normal without rales, rhonchi, or wheezing  CVS: S1 S2 regular, no murmurs, gallops, rubs.  No bruit Extremities: Bilateral Lower Ext shows no edema, both legs are warm to touch with = pulse throughout Neurology:  CN II-XII grossly intact, Non focal.   Psych:  TP linear. J/I WNL. Normal speech. Appropriate eye contact and affect.  Skin:  No Rash  Data Review Lab Results  Component Value Date   HGBA1C 5.10 12/04/2014   HGBA1C 5.0 02/22/2013   D/w Dr. Doreene Hardin and reviewed EKG w/ Dr Katelyn Hardin.   Assessment & Plan   1. Dizziness and giddiness Believe ENT related.  No neuro signs.  EKG with no acute changes. Flonase X 3 weeks Adequate hydration imperative Meclizine as directed  2. Hypokalemia Check BMP   Patient have been counseled extensively about nutrition and exercise F/up 3 weeks with Dr Katelyn Hardin.   The patient was given clear instructions to go to ER or return to medical center if symptoms don't improve, worsen or new problems develop. The patient verbalized understanding. The patient was told to call to get lab results if they haven't heard anything in the next week.     Katelyn Caldron, PA-C Katelyn Hardin and Katelyn Hardin, Katelyn Hardin   09/15/2016, 10:09 AMPatient ID: Katelyn Hardin, female   DOB: [birth year], 45 y.o.   MRN: [id number]

## 2016-09-15 NOTE — Progress Notes (Signed)
Pt is in the office today for dizziness Pt states she is not in any pain Pt states the dizziness has been going on for 2 weeks

## 2016-09-16 LAB — VITAMIN D 25 HYDROXY (VIT D DEFICIENCY, FRACTURES): VIT D 25 HYDROXY: 27 ng/mL — AB (ref 30–100)

## 2016-09-21 ENCOUNTER — Telehealth: Payer: Self-pay

## 2016-09-21 NOTE — Telephone Encounter (Signed)
Contacted pt to go over lab results pt is aware of results and doesn't have any questions or concerns 

## 2016-09-23 ENCOUNTER — Other Ambulatory Visit: Payer: Self-pay | Admitting: Pharmacist

## 2016-09-23 DIAGNOSIS — H9201 Otalgia, right ear: Secondary | ICD-10-CM

## 2016-09-23 MED ORDER — FLUTICASONE PROPIONATE 50 MCG/ACT NA SUSP: 2.0000 | Freq: Every day | NASAL | 2 refills | Status: DC

## 2016-10-21 ENCOUNTER — Ambulatory Visit: Payer: Self-pay | Attending: Internal Medicine | Admitting: Physician Assistant

## 2016-10-21 VITALS — BP 130/81 | HR 67 | Temp 98.1°F | Resp 16 | Wt 202.8 lb

## 2016-10-21 DIAGNOSIS — R6882 Decreased libido: Secondary | ICD-10-CM

## 2016-10-21 DIAGNOSIS — N898 Other specified noninflammatory disorders of vagina: Secondary | ICD-10-CM

## 2016-10-21 DIAGNOSIS — M5441 Lumbago with sciatica, right side: Secondary | ICD-10-CM | POA: Insufficient documentation

## 2016-10-21 DIAGNOSIS — M5442 Lumbago with sciatica, left side: Secondary | ICD-10-CM | POA: Insufficient documentation

## 2016-10-21 DIAGNOSIS — R350 Frequency of micturition: Secondary | ICD-10-CM | POA: Insufficient documentation

## 2016-10-21 DIAGNOSIS — Z79899 Other long term (current) drug therapy: Secondary | ICD-10-CM | POA: Insufficient documentation

## 2016-10-21 DIAGNOSIS — M5440 Lumbago with sciatica, unspecified side: Secondary | ICD-10-CM

## 2016-10-21 LAB — POCT URINALYSIS DIPSTICK
Bilirubin, UA: NEGATIVE
Blood, UA: NEGATIVE
GLUCOSE UA: NEGATIVE
Ketones, UA: NEGATIVE
Leukocytes, UA: NEGATIVE
Nitrite, UA: NEGATIVE
PH UA: 6.5
Protein, UA: NEGATIVE
SPEC GRAV UA: 1.015
UROBILINOGEN UA: 0.2

## 2016-10-21 MED ORDER — METHOCARBAMOL 500 MG PO TABS: 500.0000 mg | ORAL_TABLET | Freq: Four times a day (QID) | ORAL | 0 refills | Status: DC

## 2016-10-21 MED ORDER — NAPROXEN 500 MG PO TABS: 500.0000 mg | ORAL_TABLET | Freq: Two times a day (BID) | ORAL | 0 refills | Status: DC

## 2016-10-21 NOTE — Progress Notes (Signed)
Katelyn Hardin, is a 45 y.o. female  [id number]  [id number]  DOB - Jun 26, 1971  Subjective:  Chief Complaint and HPI: Katelyn Hardin is a 45 y.o. female here today for multiple concerns.  1) on and off vaginal discharge and odor and at times frequency of urination.  She denies pelvic pain/f/c/N/V.  Monogamous with partner.  Last period was almost a year ago and she believes she is in menopause.  Also, related to that-mood swings, decreased libido, and sleep disturbance.  Dr Doreene Burke had prescribed estrogen cream which she has not started using.  2)Right lower back pain that shoots down into her R leg at times.  NKI.  It has been bothering her the last few weeks.  No numbness or weakness or gait instability.  No problems moving bowels or bladder.  3)She wants something to help her lose weight.  She admits to poor food choices and emotional eating with little exercise.    ROS:   Constitutional:  No f/c, No night sweats, No unexplained weight loss. + fatigue EENT:  No vision changes, No blurry vision, No hearing changes. No mouth, throat, or ear problems.  Respiratory: No cough, No SOB Cardiac: No CP, no palpitations GI:  No abd pain, No N/V/D. GU: occasional urinary frequency, on and off vaginal discharge/odor Musculoskeletal: +R lower back pain as above. Neuro: No headache, no dizziness, no motor weakness.  Skin: No rash Endocrine:  No polydipsia. No polyuria.  Psych: Denies SI/HI  No problems updated.  ALLERGIES: Allergies  Allergen Reactions  . Acetaminophen Other (See Comments)    "Bothers her- jittery and nausea."  . Codeine     Upset stomach  . Oxycodone-Acetaminophen     Upset stomach  . Vicodin [Hydrocodone-Acetaminophen] Nausea Only    PAST MEDICAL HISTORY: Past Medical History:  Diagnosis Date  . Anemia   . Bacterial vaginosis   . Fibroids   . Fibromyalgia   . Hypertension   . IBS (irritable bowel syndrome)     MEDICATIONS AT  HOME: Prior to Admission medications   Medication Sig Start Date End Date Taking? Authorizing Provider  cetirizine (ZYRTEC) 10 MG tablet Take 1 tablet (10 mg total) by mouth daily. 12/30/15  Yes Willeen Niece, MD  fluticasone (FLONASE) 50 MCG/ACT nasal spray Place 2 sprays into both nostrils daily. 09/23/16  Yes Tresa Garter, MD  hydrochlorothiazide (HYDRODIURIL) 25 MG tablet Take 1 tablet (25 mg total) by mouth daily. 06/03/16  Yes Dionne Bucy Aseneth Hack, PA-C  lisinopril (PRINIVIL,ZESTRIL) 10 MG tablet Take 1 tablet (10 mg total) by mouth daily. 06/03/16  Yes Argentina Donovan, PA-C  meclizine (ANTIVERT) 25 MG tablet Take 1 tablet (25 mg total) by mouth 3 (three) times daily as needed for dizziness. 09/15/16  Yes Argentina Donovan, PA-C  Multiple Vitamin (MULTIVITAMIN WITH MINERALS) TABS tablet Take 1 tablet by mouth daily. 06/23/16  Yes Tresa Garter, MD  PARoxetine (PAXIL) 40 MG tablet Take 1 tablet (40 mg total) by mouth daily. 06/23/16 06/23/17 Yes Olugbemiga Essie Christine, MD  ramelteon (ROZEREM) 8 MG tablet Take 1 tablet (8 mg total) by mouth at bedtime. 09/15/16  Yes Argentina Donovan, PA-C  clotrimazole-betamethasone (LOTRISONE) cream Apply 1 application topically 2 (two) times daily. Patient not taking: Reported on 09/15/2016 01/21/16   Tresa Garter, MD  estradiol (ESTRACE) 0.1 MG/GM vaginal cream Place 1 Applicatorful vaginally at bedtime. 08/12/16   Tresa Garter, MD  methocarbamol (ROBAXIN) 500 MG tablet Take 1 tablet (500  mg total) by mouth 4 (four) times daily. X 10 days then prn 10/21/16   Argentina Donovan, PA-C  naproxen (NAPROSYN) 500 MG tablet Take 1 tablet (500 mg total) by mouth 2 (two) times daily with a meal. X10 days then prn pain 10/21/16   Argentina Donovan, PA-C  ondansetron (ZOFRAN ODT) 4 MG disintegrating tablet Take 1 tablet (4 mg total) by mouth every 8 (eight) hours as needed for nausea. Patient not taking: Reported on 09/15/2016 08/03/16   Tanna Furry, MD  oxyCODONE  (ROXICODONE) 5 MG immediate release tablet Take 1 tablet (5 mg total) by mouth every 4 (four) hours as needed for severe pain. Patient not taking: Reported on 09/15/2016 08/03/16   Tanna Furry, MD  tamsulosin (FLOMAX) 0.4 MG CAPS capsule Take 1 capsule (0.4 mg total) by mouth daily. Patient not taking: Reported on 09/15/2016 08/03/16   Tanna Furry, MD     Objective:  EXAM:   Vitals:   10/21/16 1147  BP: 130/81  Pulse: 67  Resp: 16  Temp: 98.1 F (36.7 C)  TempSrc: Oral  SpO2: 100%  Weight: 202 lb 12.8 oz (92 kg)    General appearance : A&OX3. NAD. Non-toxic-appearing HEENT: Atraumatic and Normocephalic.  PERRLA. EOM intact.  TM clear B. Mouth-MMM, post pharynx WNL w/o erythema, No PND. Neck: supple, no JVD. No cervical lymphadenopathy. No thyromegaly Chest/Lungs:  Breathing-non-labored, Good air entry bilaterally, breath sounds normal without rales, rhonchi, or wheezing  CVS: S1 S2 regular, no murmurs, gallops, rubs  GU/Abdomen: External genitalia WNL.  Vaginal mucosa intact with normal healthy rugation.  Cervix appears normal and healthy.  Some discharge present.  gen probe and wet prep taken.  Bimanual exam unremarkable.  Bowel sounds present, Non tender and not distended with no gaurding, rigidity or rebound. Back:  +lumbar lordosis with mild TTP over SI joint.  Spine with ~90% normal ROM and strength.  Neg SLR B Extremities: Bilateral Lower Ext shows no edema, both legs are warm to touch with = pulse throughout.  DTR =B.  No foot drop, No babinski Neurology:  CN II-XII grossly intact, Non focal.   Psych:  TP linear. J/I WNL. Normal speech. Appropriate eye contact and affect.  Skin:  No Rash  Data Review Lab Results  Component Value Date   HGBA1C 5.10 12/04/2014   HGBA1C 5.0 02/22/2013     Assessment & Plan   1. Vaginal discharge Await testing results- - POCT urinalysis dipstick - Cervicovaginal ancillary only  2. Acute bilateral low back pain with sciatica, sciatica  laterality unspecified - methocarbamol (ROBAXIN) 500 MG tablet; Take 1 tablet (500 mg total) by mouth 4 (four) times daily. X 10 days then prn  Dispense: 120 tablet; Refill: 0 - naproxen (NAPROSYN) 500 MG tablet; Take 1 tablet (500 mg total) by mouth 2 (two) times daily with a meal. X10 days then prn pain  Dispense: 60 tablet; Refill: 0  3. Decreased libido She can try ginseng.  She still  Has not started the estrogen cream Dr Doreene Burke prescribed her which might also help.  She will f/up with Dr. Doreene Burke on this.  Also discussed self-care, possible OA for her concern with wanting to lose weight.  We have discussed implementing small sustainable changes such as increasing her water intake first, then making healthier choices, adequate rest, etc.    Spent 50 mins face to face with patient addressing concerns and counseling on potential solutions.   Patient have been counseled extensively about nutrition and  exercise  Return in about 3 weeks (around 11/11/2016) for f/up Dr Doreene Burke.  The patient was given clear instructions to go to ER or return to medical center if symptoms don't improve, worsen or new problems develop. The patient verbalized understanding. The patient was told to call to get lab results if they haven't heard anything in the next week.     Freeman Caldron, PA-C [facility] and [place], [place]   10/21/2016, 1:27 PMPatient ID: [name], female   DOB: [birth year], 45 y.o.   MRN: [id number]

## 2016-10-21 NOTE — Progress Notes (Signed)
Pt is in the office today for a possible uti Pt states she is not having any burning sensation when urinating Pt states she hasn't drunk that much water in November Pt states she has discharge Pt states there is an odor but not that strong

## 2016-10-24 LAB — CERVICOVAGINAL ANCILLARY ONLY
CHLAMYDIA, DNA PROBE: NEGATIVE
Neisseria Gonorrhea: NEGATIVE

## 2016-10-25 LAB — CERVICOVAGINAL ANCILLARY ONLY: Wet Prep (BD Affirm): POSITIVE — AB

## 2016-10-27 ENCOUNTER — Encounter: Payer: Self-pay | Admitting: Physician Assistant

## 2016-10-27 ENCOUNTER — Other Ambulatory Visit: Payer: Self-pay | Admitting: Physician Assistant

## 2016-10-27 ENCOUNTER — Other Ambulatory Visit: Payer: Self-pay

## 2016-10-27 ENCOUNTER — Ambulatory Visit: Payer: Self-pay | Attending: Internal Medicine | Admitting: Physician Assistant

## 2016-10-27 VITALS — BP 132/85 | HR 66 | Temp 97.7°F | Resp 16 | Wt 198.0 lb

## 2016-10-27 DIAGNOSIS — H6993 Unspecified Eustachian tube disorder, bilateral: Secondary | ICD-10-CM | POA: Insufficient documentation

## 2016-10-27 DIAGNOSIS — Z79899 Other long term (current) drug therapy: Secondary | ICD-10-CM | POA: Insufficient documentation

## 2016-10-27 DIAGNOSIS — H6983 Other specified disorders of Eustachian tube, bilateral: Secondary | ICD-10-CM

## 2016-10-27 DIAGNOSIS — R42 Dizziness and giddiness: Secondary | ICD-10-CM | POA: Insufficient documentation

## 2016-10-27 DIAGNOSIS — R0609 Other forms of dyspnea: Secondary | ICD-10-CM | POA: Insufficient documentation

## 2016-10-27 DIAGNOSIS — B9689 Other specified bacterial agents as the cause of diseases classified elsewhere: Secondary | ICD-10-CM

## 2016-10-27 DIAGNOSIS — N760 Acute vaginitis: Principal | ICD-10-CM

## 2016-10-27 MED ORDER — METRONIDAZOLE 500 MG PO TABS: 500.0000 mg | ORAL_TABLET | Freq: Two times a day (BID) | ORAL | 0 refills | Status: DC

## 2016-10-27 NOTE — Progress Notes (Signed)
Katelyn Hardin, is a 45 y.o. female  [id number]  [id number]  DOB - 03/24/1971  Subjective:  Chief Complaint and HPI: Katelyn Hardin is a 45 y.o. female here today for 1 week h/o dizziness.  She was here in October for the same symptoms. She took flonase which started helping.  She took it for several weeks and the symptoms resolved.  She stopped the flonase a few weeks ago and just restarted it a few days ago after the dizziness returned.  She does admit to eye redness and periorbital puffiness and congestion returning while she was off the flonase.  She denies CP.  She denies neurological deficits/weakness.  She has noticed some possible dyspnea on exertion.  She denies SOB at rest.  Only notices occasionally getting out of breath if she is doing something active.  She denies CP during these episodes.  She denies s/sx of claudication.  She feels this has maybe been a little more so than normal "getting out of breath because I'm out of shape" for her.   ROS:   Constitutional:  No f/c, No night sweats, No unexplained weight loss. EENT:  No vision changes, No blurry vision, No hearing changes. No mouth, throat, or ear problems.  Respiratory: No cough, No SOB Cardiac: No CP, no palpitations GI:  No abd pain, No N/V/D. GU: No Urinary s/sx Musculoskeletal: No joint pain Neuro: No headache, + dizziness, no motor weakness.  Skin: No rash Endocrine:  No polydipsia. No polyuria.  Psych: Denies SI/HI  No problems updated.  ALLERGIES: Allergies  Allergen Reactions  . Acetaminophen Other (See Comments)    "Bothers her- jittery and nausea."  . Codeine     Upset stomach  . Oxycodone-Acetaminophen     Upset stomach  . Vicodin [Hydrocodone-Acetaminophen] Nausea Only    PAST MEDICAL HISTORY: Past Medical History:  Diagnosis Date  . Anemia   . Bacterial vaginosis   . Fibroids   . Fibromyalgia   . Hypertension   . IBS (irritable bowel syndrome)     MEDICATIONS  AT HOME: Prior to Admission medications   Medication Sig Start Date End Date Taking? Authorizing Provider  cetirizine (ZYRTEC) 10 MG tablet Take 1 tablet (10 mg total) by mouth daily. 12/30/15   Willeen Niece, MD  clotrimazole-betamethasone (LOTRISONE) cream Apply 1 application topically 2 (two) times daily. Patient not taking: Reported on 09/15/2016 01/21/16   Tresa Garter, MD  estradiol (ESTRACE) 0.1 MG/GM vaginal cream Place 1 Applicatorful vaginally at bedtime. 08/12/16   Tresa Garter, MD  fluticasone (FLONASE) 50 MCG/ACT nasal spray Place 2 sprays into both nostrils daily. 09/23/16   Tresa Garter, MD  hydrochlorothiazide (HYDRODIURIL) 25 MG tablet Take 1 tablet (25 mg total) by mouth daily. 06/03/16   Argentina Donovan, PA-C  lisinopril (PRINIVIL,ZESTRIL) 10 MG tablet Take 1 tablet (10 mg total) by mouth daily. 06/03/16   Argentina Donovan, PA-C  meclizine (ANTIVERT) 25 MG tablet Take 1 tablet (25 mg total) by mouth 3 (three) times daily as needed for dizziness. 09/15/16   Argentina Donovan, PA-C  methocarbamol (ROBAXIN) 500 MG tablet Take 1 tablet (500 mg total) by mouth 4 (four) times daily. X 10 days then prn 10/21/16   Argentina Donovan, PA-C  metroNIDAZOLE (FLAGYL) 500 MG tablet Take 1 tablet (500 mg total) by mouth 2 (two) times daily. 10/27/16   Argentina Donovan, PA-C  Multiple Vitamin (MULTIVITAMIN WITH MINERALS) TABS tablet Take 1 tablet by mouth daily.  06/23/16   Tresa Garter, MD  naproxen (NAPROSYN) 500 MG tablet Take 1 tablet (500 mg total) by mouth 2 (two) times daily with a meal. X10 days then prn pain 10/21/16   Argentina Donovan, PA-C  ondansetron (ZOFRAN ODT) 4 MG disintegrating tablet Take 1 tablet (4 mg total) by mouth every 8 (eight) hours as needed for nausea. Patient not taking: Reported on 09/15/2016 08/03/16   Tanna Furry, MD  oxyCODONE (ROXICODONE) 5 MG immediate release tablet Take 1 tablet (5 mg total) by mouth every 4 (four) hours as needed for severe  pain. Patient not taking: Reported on 09/15/2016 08/03/16   Tanna Furry, MD  PARoxetine (PAXIL) 40 MG tablet Take 1 tablet (40 mg total) by mouth daily. 06/23/16 06/23/17  Tresa Garter, MD  ramelteon (ROZEREM) 8 MG tablet Take 1 tablet (8 mg total) by mouth at bedtime. 09/15/16   Argentina Donovan, PA-C  tamsulosin (FLOMAX) 0.4 MG CAPS capsule Take 1 capsule (0.4 mg total) by mouth daily. Patient not taking: Reported on 09/15/2016 08/03/16   Tanna Furry, MD     Objective:  EXAM:   Vitals:   10/27/16 1656  BP: 132/85  Pulse: 66  Resp: 16  Temp: 97.7 F (36.5 C)  TempSrc: Oral  SpO2: 97%  Weight: 198 lb (89.8 kg)    General appearance : A&OX3. NAD. Non-toxic-appearing HEENT: Atraumatic and Normocephalic.  PERRLA. EOM intact.  TM clear B. Mouth-MMM, post pharynx WNL w/o erythema, No PND. Neck: supple, no JVD. No cervical lymphadenopathy. No thyromegaly Chest/Lungs:  Breathing-non-labored, Good air entry bilaterally, breath sounds normal without rales, rhonchi, or wheezing  CVS: S1 S2 regular, no murmurs, gallops, rubs  Extremities: Bilateral Lower Ext shows no edema, both legs are warm to touch with = pulse throughout.    Neurology:  CN II-XII grossly intact, Non focal.  Finger to nose, heel to shin, cerebellar function in tact.  Neg Romberg.  Facial symmetry normal. U/L ext motor symmetry in tact.  Psych:  TP linear. J/I WNL. Normal speech. Appropriate eye contact and affect.  Skin:  No Rash  Data Review Lab Results  Component Value Date   HGBA1C 5.10 12/04/2014   HGBA1C 5.0 02/22/2013     Assessment & Plan   1. Dyspnea on exertion Likely related to deconditioned physical status.  She should begin to increase her physical exercise as tolerated  2. Dizziness and giddiness No sign of cardiac or neurological causes today.  EKG normal without acute changes.  Reviewed case with Dr Doreene Burke and had him also review EKG.   Restart flonase  3. Dysfunction of both eustachian  tubes Restart flonase  F/up with Dr Doreene Burke in 3 weeks(see last note)   Patient have been counseled extensively about nutrition and exercise  The patient was given clear instructions to go to ER or return to medical center if symptoms don't improve, worsen or new problems develop. The patient verbalized understanding. The patient was told to call to get lab results if they haven't heard anything in the next week.     Freeman Caldron, PA-C [facility] and [place], [place]   10/27/2016, 5:16 PMPatient ID: Katelyn Hardin, female   DOB: [birth year], 45 y.o.   MRN: [id number]

## 2016-10-27 NOTE — Patient Instructions (Signed)
Vitamin D 2000 units daily.

## 2016-10-28 ENCOUNTER — Telehealth: Payer: Self-pay

## 2016-10-28 ENCOUNTER — Telehealth: Payer: Self-pay | Admitting: Internal Medicine

## 2016-10-28 NOTE — Telephone Encounter (Signed)
Contacted pt to go over lab results pt didn't answer lvm asking pt to give me a call back at her earliest convenience e

## 2016-10-28 NOTE — Telephone Encounter (Signed)
Pt returning call from nurse

## 2016-10-31 ENCOUNTER — Other Ambulatory Visit: Payer: Self-pay | Admitting: Internal Medicine

## 2016-10-31 ENCOUNTER — Ambulatory Visit: Payer: Self-pay | Attending: Internal Medicine

## 2016-10-31 DIAGNOSIS — Z1231 Encounter for screening mammogram for malignant neoplasm of breast: Secondary | ICD-10-CM

## 2016-11-03 ENCOUNTER — Ambulatory Visit: Payer: Self-pay | Admitting: Internal Medicine

## 2016-11-10 ENCOUNTER — Ambulatory Visit: Payer: Self-pay | Admitting: Internal Medicine

## 2016-12-14 ENCOUNTER — Ambulatory Visit: Payer: Self-pay | Admitting: Internal Medicine

## 2017-01-02 ENCOUNTER — Other Ambulatory Visit: Payer: Self-pay | Admitting: Internal Medicine

## 2017-01-02 DIAGNOSIS — N951 Menopausal and female climacteric states: Secondary | ICD-10-CM

## 2017-01-04 ENCOUNTER — Encounter: Payer: Self-pay | Admitting: Internal Medicine

## 2017-01-04 ENCOUNTER — Ambulatory Visit: Payer: Self-pay | Attending: Internal Medicine | Admitting: Internal Medicine

## 2017-01-04 VITALS — BP 109/71 | HR 77 | Temp 98.3°F | Resp 18 | Ht 66.0 in | Wt 201.6 lb

## 2017-01-04 DIAGNOSIS — H8110 Benign paroxysmal vertigo, unspecified ear: Secondary | ICD-10-CM | POA: Insufficient documentation

## 2017-01-04 DIAGNOSIS — G4733 Obstructive sleep apnea (adult) (pediatric): Secondary | ICD-10-CM | POA: Insufficient documentation

## 2017-01-04 DIAGNOSIS — Z6832 Body mass index (BMI) 32.0-32.9, adult: Secondary | ICD-10-CM | POA: Insufficient documentation

## 2017-01-04 DIAGNOSIS — K589 Irritable bowel syndrome without diarrhea: Secondary | ICD-10-CM | POA: Insufficient documentation

## 2017-01-04 DIAGNOSIS — M797 Fibromyalgia: Secondary | ICD-10-CM | POA: Insufficient documentation

## 2017-01-04 DIAGNOSIS — Z885 Allergy status to narcotic agent status: Secondary | ICD-10-CM | POA: Insufficient documentation

## 2017-01-04 DIAGNOSIS — H8113 Benign paroxysmal vertigo, bilateral: Secondary | ICD-10-CM

## 2017-01-04 DIAGNOSIS — Z79899 Other long term (current) drug therapy: Secondary | ICD-10-CM | POA: Insufficient documentation

## 2017-01-04 DIAGNOSIS — E669 Obesity, unspecified: Secondary | ICD-10-CM | POA: Insufficient documentation

## 2017-01-04 DIAGNOSIS — D649 Anemia, unspecified: Secondary | ICD-10-CM | POA: Insufficient documentation

## 2017-01-04 DIAGNOSIS — H9201 Otalgia, right ear: Secondary | ICD-10-CM | POA: Insufficient documentation

## 2017-01-04 DIAGNOSIS — F329 Major depressive disorder, single episode, unspecified: Secondary | ICD-10-CM | POA: Insufficient documentation

## 2017-01-04 DIAGNOSIS — I10 Essential (primary) hypertension: Secondary | ICD-10-CM | POA: Insufficient documentation

## 2017-01-04 DIAGNOSIS — N951 Menopausal and female climacteric states: Secondary | ICD-10-CM | POA: Insufficient documentation

## 2017-01-04 MED ORDER — CETIRIZINE HCL 10 MG PO TABS: 10.0000 mg | ORAL_TABLET | Freq: Every day | ORAL | 11 refills | Status: DC

## 2017-01-04 MED ORDER — LISINOPRIL 10 MG PO TABS: 10.0000 mg | ORAL_TABLET | Freq: Every day | ORAL | 3 refills | Status: DC

## 2017-01-04 MED ORDER — PAROXETINE HCL 40 MG PO TABS: 40.0000 mg | ORAL_TABLET | Freq: Every day | ORAL | 3 refills | Status: DC

## 2017-01-04 MED ORDER — MECLIZINE HCL 25 MG PO TABS: 25.0000 mg | ORAL_TABLET | Freq: Three times a day (TID) | ORAL | 0 refills | Status: DC | PRN

## 2017-01-04 MED ORDER — HYDROCHLOROTHIAZIDE 25 MG PO TABS: 25.0000 mg | ORAL_TABLET | Freq: Every day | ORAL | 3 refills | Status: DC

## 2017-01-04 NOTE — Progress Notes (Signed)
Patient is here for Recheck of BP  Patient denies pain at this time.  Patient has taken medication today. Patient has eaten today.

## 2017-01-04 NOTE — Progress Notes (Signed)
Subjective:    Patient ID: Katelyn Hardin, female    DOB: August 16, 1971, 46 y.o.   MRN: [id number]  HPI Katelyn Hardin is a 46 y.o. female with history of hypertension, IBS, fibromyalgia, chronic anemia, benign positional vertigo, obesity and depression here today for a follow up visit on chronic medical problems. Patient's blood pressure is controlled, she is complaint with her BP meds. Her previous complaint of hot flashes has improved with the Paxil. She has a complaint of chronic dizziness, that occurs with change in head position. She said it feels different from her vertigo and it does not respond to Meclizine. She denies fainting or falls or change in gait. denies Tinnitus or ear pain. She also reports low Libido, has not being complaint with estrogen cream. She wants to be checked for sleep apnea, she reports not sleeping well. Her younger sister was recently diagnosed with breast cancer. Patient has appointment for her mammogram coming up. Patient denies headache, chest pain, Nausea,chills or fever. No new weakness tingling or numbness. She denies being depressed, denies suicidal ideation or thought.    Past Medical History:  Diagnosis Date  . Anemia   . Bacterial vaginosis   . Fibroids   . Fibromyalgia   . Hypertension   . IBS (irritable bowel syndrome)    Current Outpatient Prescriptions on File Prior to Visit  Medication Sig Dispense Refill  . cetirizine (ZYRTEC) 10 MG tablet Take 1 tablet (10 mg total) by mouth daily. 30 tablet 11  . estradiol (ESTRACE) 0.1 MG/GM vaginal cream Place 1 Applicatorful vaginally at bedtime. 42.5 g 12  . fluticasone (FLONASE) 50 MCG/ACT nasal spray Place 2 sprays into both nostrils daily. 16 g 2  . hydrochlorothiazide (HYDRODIURIL) 25 MG tablet Take 1 tablet (25 mg total) by mouth daily. 90 tablet 1  . lisinopril (PRINIVIL,ZESTRIL) 10 MG tablet Take 1 tablet (10 mg total) by mouth daily. 90 tablet 1  . meclizine (ANTIVERT) 25 MG tablet  Take 1 tablet (25 mg total) by mouth 3 (three) times daily as needed for dizziness. 60 tablet 0  . methocarbamol (ROBAXIN) 500 MG tablet Take 1 tablet (500 mg total) by mouth 4 (four) times daily. X 10 days then prn 120 tablet 0  . Multiple Vitamin (MULTIVITAMIN WITH MINERALS) TABS tablet Take 1 tablet by mouth daily. 90 tablet 3  . naproxen (NAPROSYN) 500 MG tablet Take 1 tablet (500 mg total) by mouth 2 (two) times daily with a meal. X10 days then prn pain 60 tablet 0  . PARoxetine (PAXIL) 40 MG tablet TAKE 1 TABLET BY MOUTH DAILY. 30 tablet 3  . ramelteon (ROZEREM) 8 MG tablet Take 1 tablet (8 mg total) by mouth at bedtime. 30 tablet 3  . clotrimazole-betamethasone (LOTRISONE) cream Apply 1 application topically 2 (two) times daily. (Patient not taking: Reported on 09/15/2016) 45 g 3  . ondansetron (ZOFRAN ODT) 4 MG disintegrating tablet Take 1 tablet (4 mg total) by mouth every 8 (eight) hours as needed for nausea. (Patient not taking: Reported on 09/15/2016) 6 tablet 0  . oxyCODONE (ROXICODONE) 5 MG immediate release tablet Take 1 tablet (5 mg total) by mouth every 4 (four) hours as needed for severe pain. (Patient not taking: Reported on 09/15/2016) 30 tablet 0   No current facility-administered medications on file prior to visit.    Allergies  Allergen Reactions  . Acetaminophen Other (See Comments)    "Bothers her- jittery and nausea."  . Codeine     Upset stomach  .  Oxycodone-Acetaminophen     Upset stomach  . Vicodin [Hydrocodone-Acetaminophen] Nausea Only   Review of Systems  Constitutional: Negative for appetite change and unexpected weight change.  HENT: Negative.  Negative for ear pain, hearing loss, sinus pain, sinus pressure and tinnitus.   Eyes: Negative for photophobia and visual disturbance.  Respiratory: Positive for cough (unproductive). Negative for chest tightness and shortness of breath.   Cardiovascular: Negative for palpitations and leg swelling.    Gastrointestinal: Negative for abdominal pain and blood in stool.  Endocrine: Negative for polydipsia, polyphagia and polyuria.  Genitourinary: Negative for vaginal bleeding and vaginal discharge.  Musculoskeletal: Positive for back pain. Negative for gait problem, joint swelling, neck pain and neck stiffness.  Skin: Negative.   Allergic/Immunologic: Negative.   Neurological: Positive for dizziness. Negative for syncope, facial asymmetry, weakness, light-headedness, numbness and headaches.  Hematological: Negative.   Psychiatric/Behavioral: Positive for sleep disturbance (insomnia). Negative for confusion.      Objective:  BP 109/71 (BP Location: Right Arm, Patient Position: Sitting, Cuff Size: Normal)   Pulse 77   Temp 98.3 F (36.8 C) (Oral)   Resp 18   Ht 5\' 6"  (1.676 m)   Wt 201 lb 9.6 oz (91.4 kg)   LMP 11/04/2016   SpO2 99%   BMI 32.54 kg/m   Depression screen [facility] 2/9 01/04/2017 10/27/2016 10/21/2016 09/15/2016 08/12/2016  Decreased Interest 0 0 0 0 0  Down, Depressed, Hopeless 0 0 0 0 0  PHQ - 2 Score 0 0 0 0 0  Altered sleeping 3 3 0 0 3  Tired, decreased energy 1 2 3 3 2   Change in appetite 0 0 3 2 2   Feeling bad or failure about yourself  0 0 0 0 0  Trouble concentrating 0 0 0 0 0  Moving slowly or fidgety/restless 0 0 0 0 0  Suicidal thoughts 0 0 0 0 0  PHQ-9 Score 4 5 6 5 7   Difficult doing work/chores - - - - -      Physical Exam  Constitutional: She is oriented to person, place, and time. She appears well-developed and well-nourished. No distress.  HENT:  Head: Normocephalic.  Mouth/Throat: Oropharynx is clear and moist.  Eyes: Conjunctivae are normal. Pupils are equal, round, and reactive to light.  Neck: Normal range of motion. Neck supple. No thyromegaly present.  Cardiovascular: Normal rate, regular rhythm, normal heart sounds and intact distal pulses.   No murmur heard. Pulmonary/Chest: Effort normal and breath sounds normal. No respiratory distress.   Abdominal: Soft. She exhibits no distension. There is no tenderness.  Abdomen is obese  Musculoskeletal: Normal range of motion. She exhibits no edema.  Lymphadenopathy:    She has no cervical adenopathy.  Neurological: She is alert and oriented to person, place, and time. Cranial nerve deficit: propreception intact.  Skin: Skin is warm and dry.  Psychiatric: Her behavior is normal.   Assessment & Plan   1. Right ear pain  - cetirizine (ZYRTEC) 10 MG tablet; Take 1 tablet (10 mg total) by mouth daily.  Dispense: 30 tablet; Refill: 11  2. Essential hypertension, benign  - hydrochlorothiazide (HYDRODIURIL) 25 MG tablet; Take 1 tablet (25 mg total) by mouth daily.  Dispense: 90 tablet; Refill: 3 - lisinopril (PRINIVIL,ZESTRIL) 10 MG tablet; Take 1 tablet (10 mg total) by mouth daily.  Dispense: 90 tablet; Refill: 3  3. Benign paroxysmal positional vertigo, bilateral  - meclizine (ANTIVERT) 25 MG tablet; Take 1 tablet (25 mg total) by mouth  3 (three) times daily as needed for dizziness.  Dispense: 60 tablet; Refill: 0  - Ambulatory referral to Neurology  4. Menopausal hot flushes Refill - PARoxetine (PAXIL) 40 MG tablet; Take 1 tablet (40 mg total) by mouth daily.  Dispense: 90 tablet; Refill: 3  5. OSA (obstructive sleep apnea) Ordered - Split night study; Future  Patient have been counseled extensively about nutrition and exercise. Other issues discussed during this visit include: low cholesterol diet, weight control and daily exercise, importance of adherence with medications and regular follow-up.   Return in about 6 months (around 07/04/2017) for Follow up HTN, Annual Physical.  The patient was given clear instructions to go to ER or return to medical center if symptoms don't improve, worsen or new problems develop. The patient verbalized understanding. The patient was told to call to get lab results if they haven't heard anything in the next week.   This note has been  created with Surveyor, quantity. Any transcriptional errors are unintentional.    Angelica Chessman, MD, [place], [place], [place], [place] and [place], [place]   01/05/2017, 12:22 PM

## 2017-01-04 NOTE — Patient Instructions (Signed)
Dizziness Dizziness is a common problem. It is a feeling of unsteadiness or light-headedness. You may feel like you are about to faint. Dizziness can lead to injury if you stumble or fall. Anyone can become dizzy, but dizziness is more common in older adults. This condition can be caused by a number of things, including medicines, dehydration, or illness. Follow these instructions at home: Taking these steps may help with your condition: Eating and drinking   Drink enough fluid to keep your urine clear or pale yellow. This helps to keep you from becoming dehydrated. Try to drink more clear fluids, such as water.  Do not drink alcohol.  Limit your caffeine intake if directed by your health care provider.  Limit your salt intake if directed by your health care provider. Activity   Avoid making quick movements.  Rise slowly from chairs and steady yourself until you feel okay.  In the morning, first sit up on the side of the bed. When you feel okay, stand slowly while you hold onto something until you know that your balance is fine.  Move your legs often if you need to stand in one place for a long time. Tighten and relax your muscles in your legs while you are standing.  Do not drive or operate heavy machinery if you feel dizzy.  Avoid bending down if you feel dizzy. Place items in your home so that they are easy for you to reach without leaning over. Lifestyle   Do not use any tobacco products, including cigarettes, chewing tobacco, or electronic cigarettes. If you need help quitting, ask your health care provider.  Try to reduce your stress level, such as with yoga or meditation. Talk with your health care provider if you need help. General instructions   Watch your dizziness for any changes.  Take medicines only as directed by your health care provider. Talk with your health care provider if you think that your dizziness is caused by a medicine that you are taking.  Tell a friend  or a family member that you are feeling dizzy. If he or she notices any changes in your behavior, have this person call your health care provider.  Keep all follow-up visits as directed by your health care provider. This is important. Contact a health care provider if:  Your dizziness does not go away.  Your dizziness or light-headedness gets worse.  You feel nauseous.  You have reduced hearing.  You have new symptoms.  You are unsteady on your feet or you feel like the room is spinning. Get help right away if:  You vomit or have diarrhea and are unable to eat or drink anything.  You have problems talking, walking, swallowing, or using your arms, hands, or legs.  You feel generally weak.  You are not thinking clearly or you have trouble forming sentences. It may take a friend or family member to notice this.  You have chest pain, abdominal pain, shortness of breath, or sweating.  Your vision changes.  You notice any bleeding.  You have a headache.  You have neck pain or a stiff neck.  You have a fever. This information is not intended to replace advice given to you by your health care provider. Make sure you discuss any questions you have with your health care provider. Document Released: 05/03/2001 Document Revised: 04/14/2016 Document Reviewed: 11/03/2014 Elsevier Interactive Patient Education  2017 Elsevier Inc.  

## 2017-01-06 ENCOUNTER — Encounter: Payer: Self-pay | Admitting: Neurology

## 2017-01-06 ENCOUNTER — Ambulatory Visit (INDEPENDENT_AMBULATORY_CARE_PROVIDER_SITE_OTHER): Payer: Self-pay | Admitting: Neurology

## 2017-01-06 VITALS — BP 124/82 | HR 77 | Ht 66.0 in | Wt 200.1 lb

## 2017-01-06 DIAGNOSIS — H8111 Benign paroxysmal vertigo, right ear: Secondary | ICD-10-CM

## 2017-01-06 NOTE — Progress Notes (Signed)
NEUROLOGY CONSULTATION NOTE  Kamaria McAdoo-Frazier MRN: [id number] DOB: 06-22-1971  Referring provider: Dr. Jeani Sow Primary care provider: Dr. Jeani Sow  Reason for consult:  vertigo  HISTORY OF PRESENT ILLNESS: Katelyn Hardin is a 46 year old female with hypertension, IBS, fibromyalgia, chronic anemia and depression who presents for vertigo.  History supplemented by PCP note.  She has longstanding history of benign paroxysmal positional vertigo.  In October, she developed a dizziness that seems different than her prior vertigo. At first, she thought it may have been an inner ear infection, but she saw ENT and had a normal exam.  She had nasal congestion and was treated with Flonase, which helped but the dizziness returned.  She describes the dizziness as a sense of movement but not spinning.  She feels off balance.  The feeling is constant but is exacerbated when she turns her head to the right.  She reports some blurred vision but no double vision.  She has mild dull bifrontal headache.  She reports some tinnitus.  She denies lightheadedness or sensation of near-syncope, hearing loss, double vision or unilateral numbness or weakness.    BMP from 09/15/16 demonstrated Na 139, K 3.9, Cl 105, CO2 26, glucose 82, BUN 14, Cr 0.90 and Ca 11.5; TSH was 0.85.  PAST MEDICAL HISTORY: Past Medical History:  Diagnosis Date  . Anemia   . Bacterial vaginosis   . Fibroids   . Fibromyalgia   . Hypertension   . IBS (irritable bowel syndrome)     PAST SURGICAL HISTORY: Past Surgical History:  Procedure Laterality Date  . TUBAL LIGATION    . TUBAL LIGATION      MEDICATIONS: Current Outpatient Prescriptions on File Prior to Visit  Medication Sig Dispense Refill  . cetirizine (ZYRTEC) 10 MG tablet Take 1 tablet (10 mg total) by mouth daily. 30 tablet 11  . estradiol (ESTRACE) 0.1 MG/GM vaginal cream Place 1 Applicatorful vaginally at bedtime. 42.5 g 12  . fluticasone (FLONASE) 50 MCG/ACT  nasal spray Place 2 sprays into both nostrils daily. 16 g 2  . hydrochlorothiazide (HYDRODIURIL) 25 MG tablet Take 1 tablet (25 mg total) by mouth daily. 90 tablet 3  . lisinopril (PRINIVIL,ZESTRIL) 10 MG tablet Take 1 tablet (10 mg total) by mouth daily. 90 tablet 3  . meclizine (ANTIVERT) 25 MG tablet Take 1 tablet (25 mg total) by mouth 3 (three) times daily as needed for dizziness. 60 tablet 0  . methocarbamol (ROBAXIN) 500 MG tablet Take 1 tablet (500 mg total) by mouth 4 (four) times daily. X 10 days then prn 120 tablet 0  . Multiple Vitamin (MULTIVITAMIN WITH MINERALS) TABS tablet Take 1 tablet by mouth daily. 90 tablet 3  . naproxen (NAPROSYN) 500 MG tablet Take 1 tablet (500 mg total) by mouth 2 (two) times daily with a meal. X10 days then prn pain 60 tablet 0  . PARoxetine (PAXIL) 40 MG tablet Take 1 tablet (40 mg total) by mouth daily. 90 tablet 3  . clotrimazole-betamethasone (LOTRISONE) cream Apply 1 application topically 2 (two) times daily. (Patient not taking: Reported on 09/15/2016) 45 g 3  . ramelteon (ROZEREM) 8 MG tablet Take 1 tablet (8 mg total) by mouth at bedtime. (Patient not taking: Reported on 01/06/2017) 30 tablet 3   No current facility-administered medications on file prior to visit.     ALLERGIES: Allergies  Allergen Reactions  . Acetaminophen Other (See Comments)    "Bothers her- jittery and nausea."  . Codeine  Upset stomach  . Oxycodone-Acetaminophen     Upset stomach  . Vicodin [Hydrocodone-Acetaminophen] Nausea Only    FAMILY HISTORY: Family History  Problem Relation Age of Onset  . Thyroid disease Sister   . Cancer Maternal Aunt     breast    SOCIAL HISTORY: Social History   Social History  . Marital status: Married    Spouse name: N/A  . Number of children: N/A  . Years of education: N/A   Occupational History  . Not on file.   Social History Main Topics  . Smoking status: Never Smoker  . Smokeless tobacco: Never Used  . Alcohol  use 3.6 oz/week    2 Glasses of wine, 4 Standard drinks or equivalent per week     Comment: 2 glasses of wine daily  . Drug use: No  . Sexual activity: Yes    Birth control/ protection: Surgical, Condom   Other Topics Concern  . Not on file   Social History Narrative  . No narrative on file    REVIEW OF SYSTEMS: Constitutional: No fevers, chills, or sweats, no generalized fatigue, change in appetite Eyes: blurred vision Ear, nose and throat: No hearing loss, ear pain, nasal congestion, sore throat Cardiovascular: No chest pain, palpitations Respiratory:  No shortness of breath at rest or with exertion, wheezes GastrointestinaI: No nausea, vomiting, diarrhea, abdominal pain, fecal incontinence Genitourinary:  No dysuria, urinary retention or frequency Musculoskeletal:  No neck pain, back pain Integumentary: No rash, pruritus, skin lesions Neurological: as above Psychiatric: No depression, insomnia, anxiety Endocrine: No palpitations, fatigue, diaphoresis, mood swings, change in appetite, change in weight, increased thirst Hematologic/Lymphatic:  No purpura, petechiae. Allergic/Immunologic: no itchy/runny eyes, nasal congestion, recent allergic reactions, rashes  PHYSICAL EXAM: Vitals:   01/06/17 0958  BP: 124/82  Pulse: 77   General: No acute distress.  Patient appears well-groomed.  Head:  Normocephalic/atraumatic Eyes:  fundi examined but not visualized Neck: supple, no paraspinal tenderness, full range of motion Back: No paraspinal tenderness Heart: regular rate and rhythm Lungs: Clear to auscultation bilaterally. Vascular: No carotid bruits. Neurological Exam: Mental status: alert and oriented to person, place, and time, recent and remote memory intact, fund of knowledge intact, attention and concentration intact, speech fluent and not dysarthric, language intact. Cranial nerves: CN I: not tested CN II: pupils equal, round and reactive to light, visual fields  intact CN III, IV, VI:  full range of motion, no nystagmus, no ptosis CN V: facial sensation intact CN VII: upper and lower face symmetric CN VIII: hearing intact CN IX, X: gag intact, uvula midline CN XI: sternocleidomastoid and trapezius muscles intact CN XII: tongue midline Bulk & Tone: normal, no fasciculations. Motor:  5/5 throughout  Sensation: temperature and vibration sensation intact. Deep Tendon Reflexes:  2+ throughout, toes downgoing.  Finger to nose testing:  Without dysmetria.  Heel to shin:  Without dysmetria.  Gait:  Normal station and stride.  Able to turn and tandem walk. Romberg negative. Dix-Hallpike negative but Head Impulse Test positive to the right.  IMPRESSION: Right sided benign paroxysmal positional vertigo.  Neurologic exam is non-focal.  She has a positive Head Impulse Test to the right, suggesting peripheral vestibulopathy rather than central etiology.  PLAN: 1.  Will refer to vestibular rehab 2.  She may take meclizine as needed but should limit use if possible 3.  If vestibular rehab ineffective, she should follow up for re-evaluation.  4.  I also mentioned that she should have her eyes  checked.  Thank you for allowing me to take part in the care of this patient.  Metta Clines, DO  CC:  Julio Sicks, MD

## 2017-01-06 NOTE — Patient Instructions (Signed)
I do think the vertigo/dizziness is inner ear and not from the brain.  My exam is consistent with this. You may use the meclizine but only as needed We will refer you to physical therapy to help with the dizziness If it does not improve, please follow up.

## 2017-01-13 ENCOUNTER — Ambulatory Visit: Payer: Self-pay | Admitting: Rehabilitative and Restorative Service Providers"

## 2017-01-25 ENCOUNTER — Other Ambulatory Visit: Payer: Self-pay | Admitting: Internal Medicine

## 2017-01-25 DIAGNOSIS — Z1231 Encounter for screening mammogram for malignant neoplasm of breast: Secondary | ICD-10-CM

## 2017-01-26 ENCOUNTER — Ambulatory Visit
Admission: RE | Admit: 2017-01-26 | Discharge: 2017-01-26 | Disposition: A | Discharge status: Home / Self Care | Payer: No Typology Code available for payment source | Source: Ambulatory Visit | Attending: Internal Medicine | Admitting: Internal Medicine

## 2017-01-26 DIAGNOSIS — Z1231 Encounter for screening mammogram for malignant neoplasm of breast: Secondary | ICD-10-CM

## 2017-01-31 ENCOUNTER — Telehealth: Payer: Self-pay | Admitting: *Deleted

## 2017-01-31 NOTE — Telephone Encounter (Signed)
MA informed patient of mammogram showing no evidence of malignancy. A recommended screening may be completed in one year. Medical Assistant left message on patient's home and cell voicemail. Voicemail states to give a call back to [place] with [facility] at [phone].

## 2017-01-31 NOTE — Telephone Encounter (Signed)
-----   Message from Tresa Garter, MD sent at 01/28/2017 12:04 PM EST ----- Please inform patient that her screening mammogram shows no evidence of malignancy. Recommend screening mammogram in one year

## 2017-02-26 ENCOUNTER — Ambulatory Visit (HOSPITAL_BASED_OUTPATIENT_CLINIC_OR_DEPARTMENT_OTHER): Payer: No Typology Code available for payment source | Attending: Internal Medicine | Admitting: Internal Medicine

## 2017-02-26 VITALS — Ht 66.0 in | Wt 200.0 lb

## 2017-02-26 DIAGNOSIS — G4733 Obstructive sleep apnea (adult) (pediatric): Secondary | ICD-10-CM

## 2017-02-28 ENCOUNTER — Other Ambulatory Visit: Payer: Self-pay | Admitting: Physician Assistant

## 2017-02-28 DIAGNOSIS — M5440 Lumbago with sciatica, unspecified side: Secondary | ICD-10-CM

## 2017-03-01 ENCOUNTER — Ambulatory Visit: Payer: No Typology Code available for payment source

## 2017-03-01 ENCOUNTER — Other Ambulatory Visit: Payer: Self-pay | Admitting: Physician Assistant

## 2017-03-01 ENCOUNTER — Encounter: Payer: Self-pay | Admitting: Internal Medicine

## 2017-03-01 ENCOUNTER — Ambulatory Visit: Payer: Self-pay | Attending: Internal Medicine | Admitting: Internal Medicine

## 2017-03-01 ENCOUNTER — Ambulatory Visit: Payer: Self-pay

## 2017-03-01 VITALS — BP 143/83 | HR 72 | Temp 97.9°F | Resp 18 | Ht 66.0 in | Wt 202.0 lb

## 2017-03-01 DIAGNOSIS — M5440 Lumbago with sciatica, unspecified side: Secondary | ICD-10-CM

## 2017-03-01 DIAGNOSIS — H9201 Otalgia, right ear: Secondary | ICD-10-CM | POA: Insufficient documentation

## 2017-03-01 DIAGNOSIS — M797 Fibromyalgia: Secondary | ICD-10-CM | POA: Insufficient documentation

## 2017-03-01 DIAGNOSIS — Z885 Allergy status to narcotic agent status: Secondary | ICD-10-CM | POA: Insufficient documentation

## 2017-03-01 DIAGNOSIS — K589 Irritable bowel syndrome without diarrhea: Secondary | ICD-10-CM | POA: Insufficient documentation

## 2017-03-01 DIAGNOSIS — D649 Anemia, unspecified: Secondary | ICD-10-CM | POA: Insufficient documentation

## 2017-03-01 DIAGNOSIS — M5442 Lumbago with sciatica, left side: Secondary | ICD-10-CM | POA: Insufficient documentation

## 2017-03-01 DIAGNOSIS — I10 Essential (primary) hypertension: Secondary | ICD-10-CM | POA: Insufficient documentation

## 2017-03-01 DIAGNOSIS — N951 Menopausal and female climacteric states: Secondary | ICD-10-CM | POA: Insufficient documentation

## 2017-03-01 DIAGNOSIS — E669 Obesity, unspecified: Secondary | ICD-10-CM | POA: Insufficient documentation

## 2017-03-01 DIAGNOSIS — F329 Major depressive disorder, single episode, unspecified: Secondary | ICD-10-CM | POA: Insufficient documentation

## 2017-03-01 DIAGNOSIS — M5441 Lumbago with sciatica, right side: Secondary | ICD-10-CM | POA: Insufficient documentation

## 2017-03-01 DIAGNOSIS — H8113 Benign paroxysmal vertigo, bilateral: Secondary | ICD-10-CM | POA: Insufficient documentation

## 2017-03-01 MED ORDER — METHOCARBAMOL 500 MG PO TABS: 500.0000 mg | ORAL_TABLET | Freq: Four times a day (QID) | ORAL | 0 refills | Status: DC

## 2017-03-01 MED ORDER — ESCITALOPRAM OXALATE 10 MG PO TABS: 10.0000 mg | ORAL_TABLET | Freq: Every day | ORAL | 3 refills | Status: DC

## 2017-03-01 MED ORDER — MECLIZINE HCL 25 MG PO TABS: 25.0000 mg | ORAL_TABLET | Freq: Three times a day (TID) | ORAL | 0 refills | Status: DC | PRN

## 2017-03-01 MED ORDER — ESTRADIOL 0.1 MG/GM VA CREA: 1.0000 | TOPICAL_CREAM | Freq: Every day | VAGINAL | 12 refills | Status: DC

## 2017-03-01 MED ORDER — CETIRIZINE HCL 10 MG PO TABS: 10.0000 mg | ORAL_TABLET | Freq: Every day | ORAL | 11 refills | Status: DC

## 2017-03-01 NOTE — Procedures (Signed)
      Patient Name: Katelyn Hardin, Katelyn Hardin Date: 02/26/2017 Gender: Female D.O.B: 07-02-1971 Age (years): 45 Referring Provider: Angelica Chessman Height (inches): 66 Interpreting Physician: Baird Lyons MD, ABS[facility] Weight (lbs): 200 RPSGT: Madelon Lips BMI: 32 MRN: [id number] Neck Size: 15.50 CLINICAL INFORMATION Sleep Study Type: NPSG  Indication for sleep study: OSA  Epworth Sleepiness Score: 5  SLEEP STUDY TECHNIQUE As per the AASM Manual for the Scoring of Sleep and Associated Events v2.3 (April 2016) with a hypopnea requiring 4% desaturations.  The channels recorded and monitored were frontal, central and occipital EEG, electrooculogram (EOG), submentalis EMG (chin), nasal and oral airflow, thoracic and abdominal wall motion, anterior tibialis EMG, snore microphone, electrocardiogram, and pulse oximetry.  MEDICATIONS Medications self-administered by patient taken the night of the study : none reported  SLEEP ARCHITECTURE The study was initiated at 11:04:18 PM and ended at 5:07:26 AM.  Sleep onset time was 14.7 minutes and the sleep efficiency was 89.3%. The total sleep time was 324.4 minutes.  Stage REM latency was 29.0 minutes.  The patient spent 7.55% of the night in stage N1 sleep, 47.14% in stage N2 sleep, 1.08% in stage N3 and 44.23% in REM.  Alpha intrusion was absent.  Supine sleep was 18.12%.  RESPIRATORY PARAMETERS The overall apnea/hypopnea index (AHI) was 1.8 per hour. There were 3 total apneas, including 2 obstructive, 1 central and 0 mixed apneas. There were 7 hypopneas and 12 RERAs.  The AHI during Stage REM sleep was 2.5 per hour.  AHI while supine was 5.1 per hour.  The mean oxygen saturation was 97.69%. The minimum SpO2 during sleep was 81.00%.  Loud snoring was noted during this study.  CARDIAC DATA The 2 lead EKG demonstrated sinus rhythm. The mean heart rate was 67.06 beats per minute. Other EKG findings include: PVCs . LEG  MOVEMENT DATA The total PLMS were 0 with a resulting PLMS index of 0.00. Associated arousal with leg movement index was 0.0 .  IMPRESSIONS - No significant obstructive sleep apnea occurred during this study (AHI = 1.8/h). - No significant central sleep apnea occurred during this study (CAI = 0.2/h). - Mild oxygen desaturation was noted during this study (Min O2 = 81.00%). - The patient snored with Loud snoring volume. - EKG findings include PVCs. - Clinically significant periodic limb movements did not occur during sleep. No significant associated arousals.  DIAGNOSIS  - Primary Snoring (786.09 [R06.83 ICD-10])  RECOMMENDATIONS - Consider ENT evaluation for snoring - Be careful with alcohol, sedatives and other CNS depressants that may worsen sleep apnea and disrupt normal sleep architecture. - Sleep hygiene should be reviewed to assess factors that may improve sleep quality. - Weight management and regular exercise should be initiated or continued if appropriate.  [Electronically signed] 03/01/2017 02:58 PM  Baird Lyons MD, [place], American Board of Sleep Medicine   NPI: [id number]  [place], [place] [facility]  ELECTRONICALLY SIGNED ON:  03/01/2017, 2:54 [address] PH: [phone]   FX: [phone] [place]

## 2017-03-01 NOTE — Progress Notes (Signed)
Katelyn Hardin, is a 46 y.o. female  RWE:[id number]  YPP:[id number]  DOB - [birth year]  Chief Complaint  Patient presents with  . Dizziness      Subjective:   Katelyn Hardin is a 46 y.o. female with history of hypertension, IBS, fibromyalgia, chronic anemia, benign positional vertigo, obesity and depression here today for a follow up visit and medication refill. Patient has longstanding history of benign paroxysmal positional vertigo.  In October, she developed a dizziness that seems different than her prior vertigo. At first, she thought it may have been an inner ear infection, but she saw ENT and had a normal exam. She had nasal congestion and was treated with Flonase, which helped but the dizziness returned. She describes the dizziness as a sense of movement but not spinning. She feels off balance. The feeling is constant but is exacerbated when she turns her head to the right. She reports some blurred vision but no double vision. She has mild dull bifrontal headache. She reports some tinnitus. She denies lightheadedness or sensation of near-syncope, hearing loss, double vision or unilateral numbness or weakness. Patient was referred to Neurologist and upon evaluation, patient was recommended to have vestibular rehabilitation and to have Ophthalmologic evaluation. She is here today for the referrals. She has no new complaint, dizziness persists as well as blurry vision. No chest pain, No abdominal pain - No Nausea, No new weakness tingling or numbness, No Cough - SOB.  Problem  Acute Bilateral Low Back Pain With Sciatica    ALLERGIES: Allergies  Allergen Reactions  . Acetaminophen Other (See Comments)    "Bothers her- jittery and nausea."  . Codeine     Upset stomach  . Oxycodone-Acetaminophen     Upset stomach  . Vicodin [Hydrocodone-Acetaminophen] Nausea Only    PAST MEDICAL HISTORY: Past Medical History:  Diagnosis Date  . Anemia   . Bacterial vaginosis   . Fibroids   .  Fibromyalgia   . Hypertension   . IBS (irritable bowel syndrome)     MEDICATIONS AT HOME: Prior to Admission medications   Medication Sig Start Date End Date Taking? Authorizing Provider  cetirizine (ZYRTEC) 10 MG tablet Take 1 tablet (10 mg total) by mouth daily. 03/01/17  Yes Tresa Garter, MD  clotrimazole-betamethasone (LOTRISONE) cream Apply 1 application topically 2 (two) times daily. 01/21/16  Yes Tresa Garter, MD  estradiol (ESTRACE) 0.1 MG/GM vaginal cream Place 1 Applicatorful vaginally at bedtime. 03/01/17  Yes Tresa Garter, MD  fluticasone (FLONASE) 50 MCG/ACT nasal spray Place 2 sprays into both nostrils daily. 09/23/16  Yes Tresa Garter, MD  hydrochlorothiazide (HYDRODIURIL) 25 MG tablet Take 1 tablet (25 mg total) by mouth daily. 01/04/17  Yes Tresa Garter, MD  lisinopril (PRINIVIL,ZESTRIL) 10 MG tablet Take 1 tablet (10 mg total) by mouth daily. 01/04/17  Yes Tresa Garter, MD  meclizine (ANTIVERT) 25 MG tablet Take 1 tablet (25 mg total) by mouth 3 (three) times daily as needed for dizziness. 03/01/17  Yes Tresa Garter, MD  methocarbamol (ROBAXIN) 500 MG tablet Take 1 tablet (500 mg total) by mouth 4 (four) times daily. X 10 days then prn 03/01/17  Yes Tresa Garter, MD  Multiple Vitamin (MULTIVITAMIN WITH MINERALS) TABS tablet Take 1 tablet by mouth daily. 06/23/16  Yes Ellagrace Yoshida E Doreene Burke, MD  naproxen (NAPROSYN) 500 MG tablet TAKE 1 TABLET BY MOUTH 2 TIMES DAILY WITH A MEAL FOR 10 DAYS THEN AS NEEDED FOR PAIN. 02/28/17  Yes  Tresa Garter, MD  escitalopram (LEXAPRO) 10 MG tablet Take 1 tablet (10 mg total) by mouth daily. 03/01/17   Tresa Garter, MD  ramelteon (ROZEREM) 8 MG tablet Take 1 tablet (8 mg total) by mouth at bedtime. Patient not taking: Reported on 01/06/2017 09/15/16   Argentina Donovan, PA-C    Objective:   Vitals:   03/01/17 0922  BP: (!) 143/83  Pulse: 72  Resp: 18  Temp: 97.9 F (36.6 C)  TempSrc:  Oral  SpO2: 99%  Weight: 202 lb (91.6 kg)  Height: 5\' 6"  (1.676 m)   Exam General appearance : Awake, alert, not in any distress. Speech Clear. Not toxic looking HEENT: Atraumatic and Normocephalic, pupils equally reactive to light and accomodation Neck: Supple, no JVD. No cervical lymphadenopathy.  Chest: Good air entry bilaterally, no added sounds  CVS: S1 S2 regular, no murmurs.  Abdomen: Bowel sounds present, Non tender and not distended with no gaurding, rigidity or rebound. Extremities: B/L Lower Ext shows no edema, both legs are warm to touch Neurology: Awake alert, and oriented X 3, CN II-XII intact, Non focal Skin: No Rash  Data Review Lab Results  Component Value Date   HGBA1C 5.10 12/04/2014   HGBA1C 5.0 02/22/2013    Assessment & Plan   1. Benign paroxysmal positional vertigo, bilateral  - meclizine (ANTIVERT) 25 MG tablet; Take 1 tablet (25 mg total) by mouth 3 (three) times daily as needed for dizziness.  Dispense: 60 tablet; Refill: 0 - PT vestibular rehab; Future - Ambulatory referral to Ophthalmology  2. Right ear pain  - cetirizine (ZYRTEC) 10 MG tablet; Take 1 tablet (10 mg total) by mouth daily.  Dispense: 30 tablet; Refill: 11  3. Acute bilateral low back pain with sciatica, sciatica laterality unspecified  - Ambulatory referral to Physical Therapy - methocarbamol (ROBAXIN) 500 MG tablet; Take 1 tablet (500 mg total) by mouth 4 (four) times daily. X 10 days then prn  Dispense: 120 tablet; Refill: 0  4. Menopausal hot flushes  - estradiol (ESTRACE) 0.1 MG/GM vaginal cream; Place 1 Applicatorful vaginally at bedtime.  Dispense: 42.5 g; Refill: 12 - escitalopram (LEXAPRO) 10 MG tablet; Take 1 tablet (10 mg total) by mouth daily.  Dispense: 30 tablet; Refill: 3  Patient have been counseled extensively about nutrition and exercise. Other issues discussed during this visit include: low cholesterol diet, weight control and daily exercise, and  importance of  adherence with medications and regular follow-up.    Return in about 3 months (around 05/31/2017) for Dizziness, Menopausal Syndrome.  The patient was given clear instructions to go to ER or return to medical center if symptoms don't improve, worsen or new problems develop. The patient verbalized understanding. The patient was told to call to get lab results if they haven't heard anything in the next week.   This note has been created with Surveyor, quantity. Any transcriptional errors are unintentional.    Angelica Chessman, MD, [place], [place], [place], [place] and [facility] [place], [place]   03/01/2017, 10:14 AM

## 2017-03-01 NOTE — Patient Instructions (Signed)
Benign Positional Vertigo Vertigo is the feeling that you or your surroundings are moving when they are not. Benign positional vertigo is the most common form of vertigo. The cause of this condition is not serious (is benign). This condition is triggered by certain movements and positions (is positional). This condition can be dangerous if it occurs while you are doing something that could endanger you or others, such as driving. What are the causes? In many cases, the cause of this condition is not known. It may be caused by a disturbance in an area of the inner ear that helps your brain to sense movement and balance. This disturbance can be caused by a viral infection (labyrinthitis), head injury, or repetitive motion. What increases the risk? This condition is more likely to develop in:  Women.  People who are 50 years of age or older. What are the signs or symptoms? Symptoms of this condition usually happen when you move your head or your eyes in different directions. Symptoms may start suddenly, and they usually last for less than a minute. Symptoms may include:  Loss of balance and falling.  Feeling like you are spinning or moving.  Feeling like your surroundings are spinning or moving.  Nausea and vomiting.  Blurred vision.  Dizziness.  Involuntary eye movement (nystagmus). Symptoms can be mild and cause only slight annoyance, or they can be severe and interfere with daily life. Episodes of benign positional vertigo may return (recur) over time, and they may be triggered by certain movements. Symptoms may improve over time. How is this diagnosed? This condition is usually diagnosed by medical history and a physical exam of the head, neck, and ears. You may be referred to a health care provider who specializes in ear, nose, and throat (ENT) problems (otolaryngologist) or a provider who specializes in disorders of the nervous system (neurologist). You may have additional testing,  including:  MRI.  A CT scan.  Eye movement tests. Your health care provider may ask you to change positions quickly while he or she watches you for symptoms of benign positional vertigo, such as nystagmus. Eye movement may be tested with an electronystagmogram (ENG), caloric stimulation, the Dix-Hallpike test, or the roll test.  An electroencephalogram (EEG). This records electrical activity in your brain.  Hearing tests. How is this treated? Usually, your health care provider will treat this by moving your head in specific positions to adjust your inner ear back to normal. Surgery may be needed in severe cases, but this is rare. In some cases, benign positional vertigo may resolve on its own in 2-4 weeks. Follow these instructions at home: Safety   Move slowly.Avoid sudden body or head movements.  Avoid driving.  Avoid operating heavy machinery.  Avoid doing any tasks that would be dangerous to you or others if a vertigo episode would occur.  If you have trouble walking or keeping your balance, try using a cane for stability. If you feel dizzy or unstable, sit down right away.  Return to your normal activities as told by your health care provider. Ask your health care provider what activities are safe for you. General instructions   Take over-the-counter and prescription medicines only as told by your health care provider.  Avoid certain positions or movements as told by your health care provider.  Drink enough fluid to keep your urine clear or pale yellow.  Keep all follow-up visits as told by your health care provider. This is important. Contact a health care provider   if:  You have a fever.  Your condition gets worse or you develop new symptoms.  Your family or friends notice any behavioral changes.  Your nausea or vomiting gets worse.  You have numbness or a "pins and needles" sensation. Get help right away if:  You have difficulty speaking or moving.  You are  always dizzy.  You faint.  You develop severe headaches.  You have weakness in your legs or arms.  You have changes in your hearing or vision.  You develop a stiff neck.  You develop sensitivity to light. This information is not intended to replace advice given to you by your health care provider. Make sure you discuss any questions you have with your health care provider. Document Released: 08/15/2006 Document Revised: 04/14/2016 Document Reviewed: 03/02/2015 Elsevier Interactive Patient Education  2017 [place]. Menopause Menopause is the normal time of life when menstrual periods stop completely. Menopause is complete when you have missed 12 consecutive menstrual periods. It usually occurs between the ages of 1 years and 14 years. Very rarely does a woman develop menopause before the age of 66 years. At menopause, your ovaries stop producing the female hormones estrogen and progesterone. This can cause undesirable symptoms and also affect your health. Sometimes the symptoms may occur 4-5 years before the menopause begins. There is no relationship between menopause and:  Oral contraceptives.  Number of children you had.  Race.  The age your menstrual periods started (menarche). Heavy smokers and very thin women may develop menopause earlier in life. What are the causes?  The ovaries stop producing the female hormones estrogen and progesterone. Other causes include:  Surgery to remove both ovaries.  The ovaries stop functioning for no known reason.  Tumors of the pituitary gland in the brain.  Medical disease that affects the ovaries and hormone production.  Radiation treatment to the abdomen or pelvis.  Chemotherapy that affects the ovaries. What are the signs or symptoms?  Hot flashes.  Night sweats.  Decrease in sex drive.  Vaginal dryness and thinning of the vagina causing painful intercourse.  Dryness of the skin and developing  wrinkles.  Headaches.  Tiredness.  Irritability.  Memory problems.  Weight gain.  Bladder infections.  Hair growth of the face and chest.  Infertility. More serious symptoms include:  Loss of bone (osteoporosis) causing breaks (fractures).  Depression.  Hardening and narrowing of the arteries (atherosclerosis) causing heart attacks and strokes. How is this diagnosed?  When the menstrual periods have stopped for 12 straight months.  Physical exam.  Hormone studies of the blood. How is this treated? There are many treatment choices and nearly as many questions about them. The decisions to treat or not to treat menopausal changes is an individual choice made with your health care provider. Your health care provider can discuss the treatments with you. Together, you can decide which treatment will work best for you. Your treatment choices may include:  Hormone therapy (estrogen and progesterone).  Non-hormonal medicines.  Treating the individual symptoms with medicine (for example antidepressants for depression).  Herbal medicines that may help specific symptoms.  Counseling by a psychiatrist or psychologist.  Group therapy.  Lifestyle changes including:  Eating healthy.  Regular exercise.  Limiting caffeine and alcohol.  Stress management and meditation.  No treatment. Follow these instructions at home:  Take the medicine your health care provider gives you as directed.  Get plenty of sleep and rest.  Exercise regularly.  Eat a diet that contains  calcium (good for the bones) and soy products (acts like estrogen hormone).  Avoid alcoholic beverages.  Do not smoke.  If you have hot flashes, dress in layers.  Take supplements, calcium, and vitamin D to strengthen bones.  You can use over-the-counter lubricants or moisturizers for vaginal dryness.  Group therapy is sometimes very helpful.  Acupuncture may be helpful in some cases. Contact a  health care provider if:  You are not sure you are in menopause.  You are having menopausal symptoms and need advice and treatment.  You are still having menstrual periods after age 4 years.  You have pain with intercourse.  Menopause is complete (no menstrual period for 12 months) and you develop vaginal bleeding.  You need a referral to a specialist (gynecologist, psychiatrist, or psychologist) for treatment. Get help right away if:  You have severe depression.  You have excessive vaginal bleeding.  You fell and think you have a broken bone.  You have pain when you urinate.  You develop leg or chest pain.  You have a fast pounding heart beat (palpitations).  You have severe headaches.  You develop vision problems.  You feel a lump in your breast.  You have abdominal pain or severe indigestion. This information is not intended to replace advice given to you by your health care provider. Make sure you discuss any questions you have with your health care provider. Document Released: 01/28/2004 Document Revised: 04/14/2016 Document Reviewed: 06/06/2013 Elsevier Interactive Patient Education  2017 [employer].

## 2017-03-01 NOTE — Progress Notes (Signed)
Patient is here for Dizziness  Patient complains of everyday dizziness. Patient denies pain at this time.  Patient has taken medication today. Patient has eaten today.  Patient request refills on Zyrtec, Vaginal Cream, Antivert and Naprosyn. Patient request an alternative for Paxil.

## 2017-03-02 DIAGNOSIS — G4733 Obstructive sleep apnea (adult) (pediatric): Secondary | ICD-10-CM

## 2017-03-11 DIAGNOSIS — G4733 Obstructive sleep apnea (adult) (pediatric): Secondary | ICD-10-CM

## 2017-05-11 ENCOUNTER — Other Ambulatory Visit: Payer: Self-pay | Admitting: Internal Medicine

## 2017-05-11 ENCOUNTER — Other Ambulatory Visit: Payer: Self-pay | Admitting: Physician Assistant

## 2017-05-11 DIAGNOSIS — N951 Menopausal and female climacteric states: Secondary | ICD-10-CM

## 2017-05-11 DIAGNOSIS — M5440 Lumbago with sciatica, unspecified side: Secondary | ICD-10-CM

## 2017-05-15 ENCOUNTER — Ambulatory Visit: Payer: Self-pay | Attending: Internal Medicine | Admitting: Physician Assistant

## 2017-05-15 ENCOUNTER — Encounter: Payer: Self-pay | Admitting: Physician Assistant

## 2017-05-15 VITALS — BP 112/74 | HR 76 | Temp 98.2°F | Resp 18 | Ht 66.0 in | Wt 203.0 lb

## 2017-05-15 DIAGNOSIS — Z885 Allergy status to narcotic agent status: Secondary | ICD-10-CM | POA: Insufficient documentation

## 2017-05-15 DIAGNOSIS — Z79899 Other long term (current) drug therapy: Secondary | ICD-10-CM | POA: Insufficient documentation

## 2017-05-15 DIAGNOSIS — M797 Fibromyalgia: Secondary | ICD-10-CM | POA: Insufficient documentation

## 2017-05-15 DIAGNOSIS — Z888 Allergy status to other drugs, medicaments and biological substances status: Secondary | ICD-10-CM | POA: Insufficient documentation

## 2017-05-15 DIAGNOSIS — N898 Other specified noninflammatory disorders of vagina: Secondary | ICD-10-CM

## 2017-05-15 DIAGNOSIS — N3000 Acute cystitis without hematuria: Secondary | ICD-10-CM

## 2017-05-15 DIAGNOSIS — K589 Irritable bowel syndrome without diarrhea: Secondary | ICD-10-CM | POA: Insufficient documentation

## 2017-05-15 DIAGNOSIS — I10 Essential (primary) hypertension: Secondary | ICD-10-CM

## 2017-05-15 LAB — POCT URINALYSIS DIPSTICK
BILIRUBIN UA: NEGATIVE
GLUCOSE UA: NEGATIVE
Ketones, UA: NEGATIVE
Leukocytes, UA: NEGATIVE
NITRITE UA: NEGATIVE
Protein, UA: NEGATIVE
RBC UA: NEGATIVE
Spec Grav, UA: >=1.03 — AB (ref 1.010–1.025)
Urobilinogen, UA: 1 E.U./dL
pH, UA: 5 (ref 5.0–8.0)

## 2017-05-15 MED ORDER — METRONIDAZOLE 500 MG PO TABS: 500.0000 mg | ORAL_TABLET | Freq: Three times a day (TID) | ORAL | 0 refills | Status: DC

## 2017-05-15 MED ORDER — METRONIDAZOLE 500 MG PO TABS: 500.0000 mg | ORAL_TABLET | Freq: Two times a day (BID) | ORAL | 0 refills | Status: DC

## 2017-05-15 NOTE — Progress Notes (Signed)
Katelyn Hardin, is a 46 y.o. female  BSW:[id number]  GYK:[id number]  DOB - 11/30/1970  Subjective:  Chief Complaint and HPI: Katelyn Hardin is a 46 y.o. female here today for ~10 day h/o urinary frequency and vaginal d/c.  Denies STI risk factors.  +vaginal odor.  "smells fishy-like when Ive had BV."  No abdominal pain.  No pelvic pain.  No fever.    ROS:   Constitutional:  No f/c, No night sweats, No unexplained weight loss. EENT:  No vision changes, No blurry vision, No hearing changes. No mouth, throat, or ear problems.  Respiratory: No cough, No SOB Cardiac: No CP, no palpitations GI:  No abd pain, No N/V/D. GU: + Urinary s/sx Musculoskeletal: No joint pain Neuro: No headache, no dizziness, no motor weakness.  Skin: No rash Endocrine:  No polydipsia. No polyuria.  Psych: Denies SI/HI  No problems updated.  ALLERGIES: Allergies  Allergen Reactions  . Acetaminophen Other (See Comments)    "Bothers her- jittery and nausea."  . Codeine     Upset stomach  . Oxycodone-Acetaminophen     Upset stomach  . Vicodin [Hydrocodone-Acetaminophen] Nausea Only    PAST MEDICAL HISTORY: Past Medical History:  Diagnosis Date  . Anemia   . Bacterial vaginosis   . Fibroids   . Fibromyalgia   . Hypertension   . IBS (irritable bowel syndrome)     MEDICATIONS AT HOME: Prior to Admission medications   Medication Sig Start Date End Date Taking? Authorizing Provider  cetirizine (ZYRTEC) 10 MG tablet Take 1 tablet (10 mg total) by mouth daily. 03/01/17  Yes Tresa Garter, MD  clotrimazole-betamethasone (LOTRISONE) cream Apply 1 application topically 2 (two) times daily. 01/21/16  Yes Tresa Garter, MD  escitalopram (LEXAPRO) 10 MG tablet Take 1 tablet (10 mg total) by mouth daily. 03/01/17  Yes Tresa Garter, MD  fluticasone (FLONASE) 50 MCG/ACT nasal spray Place 2 sprays into both nostrils daily. 09/23/16  Yes Tresa Garter, MD  hydrochlorothiazide  (HYDRODIURIL) 25 MG tablet Take 1 tablet (25 mg total) by mouth daily. 01/04/17  Yes Tresa Garter, MD  lisinopril (PRINIVIL,ZESTRIL) 10 MG tablet Take 1 tablet (10 mg total) by mouth daily. 01/04/17  Yes Tresa Garter, MD  meclizine (ANTIVERT) 25 MG tablet Take 1 tablet (25 mg total) by mouth 3 (three) times daily as needed for dizziness. 03/01/17  Yes Tresa Garter, MD  methocarbamol (ROBAXIN) 500 MG tablet Take 1 tablet (500 mg total) by mouth 4 (four) times daily. X 10 days then prn 03/01/17  Yes Jegede, Olugbemiga E, MD  Multiple Vitamin (MULTIVITAMIN WITH MINERALS) TABS tablet Take 1 tablet by mouth daily. 06/23/16  Yes Jegede, Olugbemiga E, MD  naproxen (NAPROSYN) 500 MG tablet TAKE 1 TABLET BY MOUTH 2 TIMES DAILY WITH A MEAL FOR 10 DAYS THEN AS NEEDED FOR PAIN. 05/12/17  Yes Jegede, Olugbemiga E, MD  estradiol (ESTRACE) 0.1 MG/GM vaginal cream Place 1 Applicatorful vaginally at bedtime. Patient not taking: Reported on 05/15/2017 03/01/17   Tresa Garter, MD  metroNIDAZOLE (FLAGYL) 500 MG tablet Take 1 tablet (500 mg total) by mouth 3 (three) times daily. 05/15/17   Argentina Donovan, PA-C  ramelteon (ROZEREM) 8 MG tablet Take 1 tablet (8 mg total) by mouth at bedtime. Patient not taking: Reported on 01/06/2017 09/15/16   Argentina Donovan, PA-C     Objective:  EXAM:   Vitals:   05/15/17 1657  BP: 112/74  Pulse: 76  Resp: 18  Temp: 98.2 F (36.8 C)  TempSrc: Oral  SpO2: 98%  Weight: 203 lb (92.1 kg)  Height: 5\' 6"  (1.676 m)    General appearance : A&OX3. NAD. Non-toxic-appearing HEENT: Atraumatic and Normocephalic.  PERRLA. EOM intact.   Neck: supple, no JVD. No cervical lymphadenopathy. No thyromegaly Chest/Lungs:  Breathing-non-labored, Good air entry bilaterally, breath sounds normal without rales, rhonchi, or wheezing  CVS: S1 S2 regular, no murmurs, gallops, rubs  Extremities: Bilateral Lower Ext shows no edema, both legs are warm to touch with = pulse  throughout Neurology:  CN II-XII grossly intact, Non focal.   Psych:  TP linear. J/I WNL. Normal speech. Appropriate eye contact and affect.  Skin:  No Rash  Data Review Lab Results  Component Value Date   HGBA1C 5.10 12/04/2014   HGBA1C 5.0 02/22/2013     Assessment & Plan   1. HYPERTENSION, BENIGN ESSENTIAL Controlled today.  Continue current regimen.    2. Acute cystitis without hematuria Increase water intake-no infection on UA dip today - Urine Culture - Urinalysis Dipstick  3. Vaginal discharge - Urine cytology ancillary only - metroNIDAZOLE (FLAGYL) 500 MG tablet; Take 1 tablet (500 mg total) by mouth 2 times daily.  Dispense: 14 tablet; Refill: 0  Patient have been counseled extensively about nutrition and exercise  Return if symptoms worsen or fail to improve.  The patient was given clear instructions to go to ER or return to medical center if symptoms don't improve, worsen or new problems develop. The patient verbalized understanding. The patient was told to call to get lab results if they haven't heard anything in the next week.   Freeman Caldron, PA-C [facility] and [place], [place]   05/15/2017, 5:11 PMPatient ID: [name], female   DOB: 17-Nov-1971, 46 y.o.   MRN: [id number]

## 2017-05-17 LAB — URINE CULTURE

## 2017-05-18 LAB — URINE CYTOLOGY ANCILLARY ONLY
Chlamydia: NEGATIVE
Neisseria Gonorrhea: NEGATIVE
Trichomonas: NEGATIVE

## 2017-05-19 LAB — URINE CYTOLOGY ANCILLARY ONLY: CANDIDA VAGINITIS: NEGATIVE

## 2017-05-31 ENCOUNTER — Ambulatory Visit: Payer: Self-pay | Attending: Internal Medicine | Admitting: Internal Medicine

## 2017-05-31 ENCOUNTER — Encounter (INDEPENDENT_AMBULATORY_CARE_PROVIDER_SITE_OTHER): Payer: Self-pay

## 2017-05-31 VITALS — BP 111/77 | HR 69 | Temp 97.9°F | Resp 18 | Ht 66.0 in | Wt 203.8 lb

## 2017-05-31 DIAGNOSIS — H8110 Benign paroxysmal vertigo, unspecified ear: Secondary | ICD-10-CM | POA: Insufficient documentation

## 2017-05-31 DIAGNOSIS — N3000 Acute cystitis without hematuria: Secondary | ICD-10-CM | POA: Insufficient documentation

## 2017-05-31 DIAGNOSIS — E669 Obesity, unspecified: Secondary | ICD-10-CM | POA: Insufficient documentation

## 2017-05-31 DIAGNOSIS — Z888 Allergy status to other drugs, medicaments and biological substances status: Secondary | ICD-10-CM | POA: Insufficient documentation

## 2017-05-31 DIAGNOSIS — K589 Irritable bowel syndrome without diarrhea: Secondary | ICD-10-CM | POA: Insufficient documentation

## 2017-05-31 DIAGNOSIS — I10 Essential (primary) hypertension: Secondary | ICD-10-CM | POA: Insufficient documentation

## 2017-05-31 DIAGNOSIS — D649 Anemia, unspecified: Secondary | ICD-10-CM | POA: Insufficient documentation

## 2017-05-31 DIAGNOSIS — F329 Major depressive disorder, single episode, unspecified: Secondary | ICD-10-CM | POA: Insufficient documentation

## 2017-05-31 DIAGNOSIS — Z79899 Other long term (current) drug therapy: Secondary | ICD-10-CM | POA: Insufficient documentation

## 2017-05-31 DIAGNOSIS — Z885 Allergy status to narcotic agent status: Secondary | ICD-10-CM | POA: Insufficient documentation

## 2017-05-31 DIAGNOSIS — M5431 Sciatica, right side: Secondary | ICD-10-CM

## 2017-05-31 DIAGNOSIS — M5432 Sciatica, left side: Secondary | ICD-10-CM | POA: Insufficient documentation

## 2017-05-31 DIAGNOSIS — M797 Fibromyalgia: Secondary | ICD-10-CM | POA: Insufficient documentation

## 2017-05-31 MED ORDER — GABAPENTIN 100 MG PO CAPS: 100.0000 mg | ORAL_CAPSULE | Freq: Three times a day (TID) | ORAL | 3 refills | Status: DC

## 2017-05-31 MED ORDER — CIPROFLOXACIN HCL 500 MG PO TABS: 500.0000 mg | ORAL_TABLET | Freq: Two times a day (BID) | ORAL | 0 refills | Status: AC

## 2017-05-31 NOTE — Progress Notes (Signed)
Katelyn Hardin, is a 46 y.o. female  [id number]:[id number]  NLZ:[id number]  DOB - September 01, 1971  Chief Complaint  Patient presents with  . Hypertension       Subjective:   Katelyn Hardin is a 47 y.o. female with history of hypertension, IBS, fibromyalgia, chronic anemia, benign positional vertigo, obesity and depression here today for a urgent care visit. She complained of suprapubic pain associated with frequency of urine but no dysuria, denies any fever, no renal angle pain. No urinary incontinence. Patient has No headache, No chest pain, No abdominal pain - No Nausea, No new weakness tingling or numbness, No Cough - SOB. Her BP is well controlled. Depression is improving. She denies any suicidal ideation or thoughts.   Problem  Acute Cystitis Without Hematuria    ALLERGIES: Allergies  Allergen Reactions  . Acetaminophen Other (See Comments)    "Bothers her- jittery and nausea."  . Codeine     Upset stomach  . Oxycodone-Acetaminophen     Upset stomach  . Vicodin [Hydrocodone-Acetaminophen] Nausea Only    PAST MEDICAL HISTORY: Past Medical History:  Diagnosis Date  . Anemia   . Bacterial vaginosis   . Fibroids   . Fibromyalgia   . Hypertension   . IBS (irritable bowel syndrome)     MEDICATIONS AT HOME: Prior to Admission medications   Medication Sig Start Date End Date Taking? Authorizing Provider  cetirizine (ZYRTEC) 10 MG tablet Take 1 tablet (10 mg total) by mouth daily. 03/01/17   Tresa Garter, MD  ciprofloxacin (CIPRO) 500 MG tablet Take 1 tablet (500 mg total) by mouth 2 (two) times daily. 05/31/17 06/05/17  Tresa Garter, MD  clotrimazole-betamethasone (LOTRISONE) cream Apply 1 application topically 2 (two) times daily. 01/21/16   Tresa Garter, MD  escitalopram (LEXAPRO) 10 MG tablet Take 1 tablet (10 mg total) by mouth daily. 03/01/17   Tresa Garter, MD  estradiol (ESTRACE) 0.1 MG/GM vaginal cream Place 1 Applicatorful vaginally at  bedtime. Patient not taking: Reported on 05/15/2017 03/01/17   Tresa Garter, MD  fluticasone (FLONASE) 50 MCG/ACT nasal spray Place 2 sprays into both nostrils daily. 09/23/16   Tresa Garter, MD  gabapentin (NEURONTIN) 100 MG capsule Take 1 capsule (100 mg total) by mouth 3 (three) times daily. 05/31/17   Tresa Garter, MD  hydrochlorothiazide (HYDRODIURIL) 25 MG tablet Take 1 tablet (25 mg total) by mouth daily. 01/04/17   Tresa Garter, MD  lisinopril (PRINIVIL,ZESTRIL) 10 MG tablet Take 1 tablet (10 mg total) by mouth daily. 01/04/17   Tresa Garter, MD  meclizine (ANTIVERT) 25 MG tablet Take 1 tablet (25 mg total) by mouth 3 (three) times daily as needed for dizziness. 03/01/17   Tresa Garter, MD  methocarbamol (ROBAXIN) 500 MG tablet Take 1 tablet (500 mg total) by mouth 4 (four) times daily. X 10 days then prn 03/01/17   Tresa Garter, MD  Multiple Vitamin (MULTIVITAMIN WITH MINERALS) TABS tablet Take 1 tablet by mouth daily. 06/23/16   Tresa Garter, MD    Objective:   Vitals:   05/31/17 1649  BP: 111/77  Pulse: 69  Resp: 18  Temp: 97.9 F (36.6 C)  TempSrc: Oral  SpO2: 100%  Weight: 203 lb 12.8 oz (92.4 kg)  Height: 5\' 6"  (1.676 m)   Exam General appearance : Awake, alert, not in any distress. Speech Clear. Not toxic looking, obese HEENT: Atraumatic and Normocephalic, pupils equally reactive to light and accomodation Neck:  Supple, no JVD. No cervical lymphadenopathy.  Chest: Good air entry bilaterally, no added sounds  CVS: S1 S2 regular, no murmurs.  Abdomen: Bowel sounds present, Non tender and not distended with no gaurding, rigidity or rebound. Extremities: B/L Lower Ext shows no edema, both legs are warm to touch Neurology: Awake alert, and oriented X 3, CN II-XII intact, Non focal Skin: No Rash  Data Review Lab Results  Component Value Date   HGBA1C 5.10 12/04/2014   HGBA1C 5.0 02/22/2013    Assessment & Plan    1. Acute cystitis without hematuria  - ciprofloxacin (CIPRO) 500 MG tablet; Take 1 tablet (500 mg total) by mouth 2 (two) times daily.  Dispense: 10 tablet; Refill: 0  2. HYPERTENSION, BENIGN ESSENTIAL  We have discussed target BP range and blood pressure goal. I have advised patient to check BP regularly and to call us back or report to clinic if the numbers are consistently higher than 140/90. We discussed the importance of compliance with medical therapy and DASH diet recommended, consequences of uncontrolled hypertension discussed.  - continue current BP medications  3. Bilateral sciatica  - gabapentin (NEURONTIN) 100 MG capsule; Take 1 capsule (100 mg total) by mouth 3 (three) times daily.  Dispense: 90 capsule; Refill: 3  Patient have been counseled extensively about nutrition and exercise. Other issues discussed during this visit include: low cholesterol diet, weight control and daily exercise, foot care, annual eye examinations at Ophthalmology, importance of adherence with medications and regular follow-up. We also discussed long term complications of uncontrolled diabetes and hypertension.   Return in about 6 months (around 12/01/2017) for Routine Follow Up.  The patient was given clear instructions to go to ER or return to medical center if symptoms don't improve, worsen or new problems develop. The patient verbalized understanding. The patient was told to call to get lab results if they haven't heard anything in the next week.   This note has been created with Surveyor, quantity. Any transcriptional errors are unintentional.    Angelica Chessman, MD, [place], [place], [place], [place] and [place], [place]   05/31/2017, 5:03 PM

## 2017-05-31 NOTE — Patient Instructions (Signed)

## 2017-06-28 ENCOUNTER — Other Ambulatory Visit: Payer: Self-pay | Admitting: Internal Medicine

## 2017-06-28 DIAGNOSIS — N951 Menopausal and female climacteric states: Secondary | ICD-10-CM

## 2017-08-21 ENCOUNTER — Telehealth: Payer: Self-pay | Admitting: Internal Medicine

## 2017-08-21 NOTE — Telephone Encounter (Signed)
Patient called requesting a referral to acupuncture for sciatica pain, please f/up

## 2017-08-21 NOTE — Telephone Encounter (Signed)
Katelyn advise on this request

## 2017-08-28 ENCOUNTER — Other Ambulatory Visit: Payer: Self-pay | Admitting: Internal Medicine

## 2017-08-28 NOTE — Telephone Encounter (Signed)
She may go to Acupuncture, there is no pathway to referral on EPIC

## 2017-08-29 NOTE — Telephone Encounter (Signed)
Patient verified DOB Patient is aware of accupuncture being elective and the patient may visit one if she would like. Patient states she stopped the Lexapro in September due to no relief being provided. Patient is also reminded to call the end of December to schedule january appointment.

## 2017-09-26 ENCOUNTER — Ambulatory Visit: Payer: Self-pay

## 2017-10-03 ENCOUNTER — Ambulatory Visit: Payer: Self-pay | Attending: Internal Medicine | Admitting: Physician Assistant

## 2017-10-03 ENCOUNTER — Encounter: Payer: Self-pay | Admitting: Physician Assistant

## 2017-10-03 ENCOUNTER — Other Ambulatory Visit: Payer: Self-pay

## 2017-10-03 VITALS — BP 133/86 | HR 67 | Temp 98.1°F | Resp 12 | Wt 198.0 lb

## 2017-10-03 DIAGNOSIS — M797 Fibromyalgia: Secondary | ICD-10-CM | POA: Insufficient documentation

## 2017-10-03 DIAGNOSIS — Z885 Allergy status to narcotic agent status: Secondary | ICD-10-CM | POA: Insufficient documentation

## 2017-10-03 DIAGNOSIS — K589 Irritable bowel syndrome without diarrhea: Secondary | ICD-10-CM | POA: Insufficient documentation

## 2017-10-03 DIAGNOSIS — M26609 Unspecified temporomandibular joint disorder, unspecified side: Secondary | ICD-10-CM

## 2017-10-03 DIAGNOSIS — Z79899 Other long term (current) drug therapy: Secondary | ICD-10-CM | POA: Insufficient documentation

## 2017-10-03 DIAGNOSIS — I10 Essential (primary) hypertension: Secondary | ICD-10-CM | POA: Insufficient documentation

## 2017-10-03 DIAGNOSIS — M26623 Arthralgia of bilateral temporomandibular joint: Secondary | ICD-10-CM | POA: Insufficient documentation

## 2017-10-03 MED ORDER — METHOCARBAMOL 500 MG PO TABS: 500.0000 mg | ORAL_TABLET | Freq: Three times a day (TID) | ORAL | 0 refills | Status: DC

## 2017-10-03 MED ORDER — NAPROXEN 500 MG PO TABS: 500.0000 mg | ORAL_TABLET | Freq: Two times a day (BID) | ORAL | 0 refills | Status: DC

## 2017-10-03 NOTE — Patient Instructions (Signed)
Teeth Grinding Teeth grinding is when you grind or clench your teeth. This condition is also called bruxism. You can grind or clench your teeth without knowing that you are doing it. It can be done during the day or at night while you are sleeping. This can lead to tooth damage and jaw pain. This condition is common. What are the causes? The cause of this condition is not known. However, bruxism may be triggered by:  Improperly aligned teeth (malocclusion).  Stress or worry.  Earaches.  Allergies.  Some medicines.  Upper respiratory infections.  Sleep disorders.  What increases the risk? This condition is more likely to develop in:  Children.  People who have a family history of teeth grinding.  People who consume nicotine, caffeine, or excess amounts of alcoholbefore sleeping.  People who are stressed.  What are the signs or symptoms? Symptoms of this condition include:  Earaches.  Teeth that are sensitive to heat, cold, and sweetness.  Damaged teeth.  Jaw pain or facial pain.  Headaches.  Muscle spasms.  Jaw problems, such as hearing a clicking sound when you open or close your mouth.  Difficulty sleeping.  Difficulty eating.  In some cases, there are no symptoms. How is this diagnosed? This condition is diagnosed with a medical history and dental exam. To rule out other conditions, you may also have other tests, including:  X-rays.  Blood tests.  How is this treated? There is no cure for this condition, but treatment can help to control your symptoms and prevent further damage to your teeth. Treatment may include:  A nightguard. This is a mouthguard that is fitted to your teeth. It places a barrier between your top teeth and your bottom teeth so that you grind on the nightguard instead.  A mouthguard that moves your lower jaw forward (mandibular advancement devices).  Dental correction of misaligned teeth. This may include braces.  Medicine to  relax your jaw muscles.  Methods to reduce your stress, such as: ? Hypnosis. ? Psychiatric treatment or counseling. ? Relaxation techniques to use before going to sleep.  Follow these instructions at home: Lifestyle  Try to reduce your stress, such as with exercise, yoga, or meditation. Talk with your health care provider if you need help to reduce your stress.  Try to get eight hours of sleep every night. Sleep in a cool, dark, and quiet room. Keep computers and work out of the bedroom.  Avoid sleeping on your back. Try sleeping on your side or your abdomen.  Avoid: ? Chewing gum. ? Eating tough or hard foods, such as candy. ? Alcohol, caffeine, and nicotine, especially before bedtime.  Drink enough fluid to keep your urine clear or pale yellow.  Try to keep your face, mouth, and jaw muscles relaxed. General instructions  If you were given a mouthguard, wear it as directed by your health care provider.  Take medicines only as directed by your health care provider.  Your health care provider may recommend applying a warm compress to your face to help relax your jaw muscles. Do this as directed by your health care provider.  Keep all follow-up visits as directed by your health care provider. This is important.  Do jaw exercises as directed by your health care provider. Contact a health care provider if:  Your symptoms get worse.  You have new symptoms.  You have trouble eating.  You have trouble opening your mouth. This information is not intended to replace advice given to  you by your health care provider. Make sure you discuss any questions you have with your health care provider. Document Released: 11/10/2003 Document Revised: 07/07/2016 Document Reviewed: 11/03/2014 Elsevier Interactive Patient Education  2018 [employer] Temporomandibular Joint Syndrome Temporomandibular joint (TMJ) syndrome is a condition that affects the joints between your jaw and your skull.  The TMJs are located near your ears and allow your jaw to open and close. These joints and the nearby muscles are involved in all movements of the jaw. People with TMJ syndrome have pain in the area of these joints and muscles. Chewing, biting, or other movements of the jaw can be difficult or painful. TMJ syndrome can be caused by various things. In many cases, the condition is mild and goes away within a few weeks. For some people, the condition can become a long-term problem. What are the causes? Possible causes of TMJ syndrome include:  Grinding your teeth or clenching your jaw. Some people do this when they are under stress.  Arthritis.  Injury to the jaw.  Head or neck injury.  Teeth or dentures that are not aligned well.  In some cases, the cause of TMJ syndrome may not be known. What are the signs or symptoms? The most common symptom is an aching pain on the side of the head in the area of the TMJ. Other symptoms may include:  Pain when moving your jaw, such as when chewing or biting.  Being unable to open your jaw all the way.  Making a clicking sound when you open your mouth.  Headache.  Earache.  Neck or shoulder pain.  How is this diagnosed? Diagnosis can usually be made based on your symptoms, your medical history, and a physical exam. Your health care provider may check the range of motion of your jaw. Imaging tests, such as X-rays or an MRI, are sometimes done. You may need to see your dentist to determine if your teeth and jaw are lined up correctly. How is this treated? TMJ syndrome often goes away on its own. If treatment is needed, the options may include:  Eating soft foods and applying ice or heat.  Medicines to relieve pain or inflammation.  Medicines to relax the muscles.  A splint, bite plate, or mouthpiece to prevent teeth grinding or jaw clenching.  Relaxation techniques or counseling to help reduce stress.  Transcutaneous electrical nerve  stimulation (TENS). This helps to relieve pain by applying an electrical current through the skin.  Acupuncture. This is sometimes helpful to relieve pain.  Jaw surgery. This is rarely needed.  Follow these instructions at home:  Take medicines only as directed by your health care provider.  Eat a soft diet if you are having trouble chewing.  Apply ice to the painful area. ? Put ice in a plastic bag. ? Place a towel between your skin and the bag. ? Leave the ice on for 20 minutes, 2-3 times a day.  Apply a warm compress to the painful area as directed.  Massage your jaw area and perform any jaw stretching exercises as recommended by your health care provider.  If you were given a mouthpiece or bite plate, wear it as directed.  Avoid foods that require a lot of chewing. Do not chew gum.  Keep all follow-up visits as directed by your health care provider. This is important. Contact a health care provider if:  You are having trouble eating.  You have new or worsening symptoms. Get help right away if:  Your jaw locks open or closed. This information is not intended to replace advice given to you by your health care provider. Make sure you discuss any questions you have with your health care provider. Document Released: 08/02/2001 Document Revised: 07/07/2016 Document Reviewed: 06/12/2014 Elsevier Interactive Patient Education  [employer].

## 2017-10-03 NOTE — Progress Notes (Signed)
Pt states she is unable to open mouth x 2 weeks

## 2017-10-03 NOTE — Progress Notes (Signed)
Patient ID: Katelyn Hardin, female   DOB: 1971/07/20, 46 y.o.   MRN: [id number]   Katelyn Hardin, is a 46 y.o. female  [id number]:[id number]  [id number]:[id number]  DOB - 1971-01-07  Subjective:  Chief Complaint and HPI: Katelyn Hardin is a 46 y.o. female here today for jaw pain that was initially on the R side and now on the L side.  It is hard for her to open her mouth fully.  She has had a significant amount of dental work over the last couple of months.  She has had a root canal, multiple teeth pulled on separate occasions, partial dentures made, etc.  No f/c.  Unsure if she grinds her teeth at night.  She resumed her allergy medicines a few days ago thinking that might help.  She has not taken anything for the discomfort.    ROS:   Constitutional:  No f/c, No night sweats, No unexplained weight loss. EENT:  No vision changes, No blurry vision, No hearing changes. No other mouth, throat, or ear problems.  Respiratory: No cough, No SOB Cardiac: No CP, no palpitations GI:  No abd pain, No N/V/D. GU: No Urinary s/sx Musculoskeletal: No joint pain Neuro: No headache, no dizziness, no motor weakness.  Skin: No rash Endocrine:  No polydipsia. No polyuria.  Psych: Denies SI/HI  No problems updated.  ALLERGIES: Allergies  Allergen Reactions  . Acetaminophen Other (See Comments)    "Bothers her- jittery and nausea."  . Aspirin Nausea And Vomiting  . Codeine     Upset stomach  . Oxycodone-Acetaminophen     Upset stomach  . Vicodin [Hydrocodone-Acetaminophen] Nausea Only    PAST MEDICAL HISTORY: Past Medical History:  Diagnosis Date  . Anemia   . Bacterial vaginosis   . Fibroids   . Fibromyalgia   . Hypertension   . IBS (irritable bowel syndrome)     MEDICATIONS AT HOME: Prior to Admission medications   Medication Sig Start Date End Date Taking? Authorizing Provider  cetirizine (ZYRTEC) 10 MG tablet Take 1 tablet (10 mg total) by mouth daily. 03/01/17  Yes Tresa Garter, MD    clotrimazole-betamethasone (LOTRISONE) cream Apply 1 application topically 2 (two) times daily. 01/21/16  Yes Tresa Garter, MD  estradiol (ESTRACE) 0.1 MG/GM vaginal cream Place 1 Applicatorful vaginally at bedtime. 03/01/17  Yes Tresa Garter, MD  fluticasone (FLONASE) 50 MCG/ACT nasal spray Place 2 sprays into both nostrils daily. 09/23/16  Yes Tresa Garter, MD  hydrochlorothiazide (HYDRODIURIL) 25 MG tablet Take 1 tablet (25 mg total) by mouth daily. 01/04/17  Yes Tresa Garter, MD  lisinopril (PRINIVIL,ZESTRIL) 10 MG tablet Take 1 tablet (10 mg total) by mouth daily. 01/04/17  Yes Tresa Garter, MD  meclizine (ANTIVERT) 25 MG tablet Take 1 tablet (25 mg total) by mouth 3 (three) times daily as needed for dizziness. 03/01/17  Yes Tresa Garter, MD  methocarbamol (ROBAXIN) 500 MG tablet Take 1 tablet (500 mg total) 3 (three) times daily by mouth. X 10 days then prn 10/03/17  Yes Ka Flammer M, PA-C  Multiple Vitamin (MULTIVITAMIN WITH MINERALS) TABS tablet Take 1 tablet by mouth daily. 06/23/16  Yes Jegede, Olugbemiga E, MD  escitalopram (LEXAPRO) 10 MG tablet TAKE 1 TABLET BY MOUTH DAILY. Patient not taking: Reported on 10/03/2017 06/29/17   Tresa Garter, MD  gabapentin (NEURONTIN) 100 MG capsule Take 1 capsule (100 mg total) by mouth 3 (three) times daily. Patient not taking: Reported on 10/03/2017 05/31/17  Tresa Garter, MD  naproxen (NAPROSYN) 500 MG tablet Take 1 tablet (500 mg total) 2 (two) times daily with a meal by mouth. X 10 days then prn pain 10/03/17   Argentina Donovan, PA-C     Objective:  EXAM:   Vitals:   10/03/17 1038  BP: 133/86  Pulse: 67  Resp: 12  Temp: 98.1 F (36.7 C)  TempSrc: Oral  SpO2: 100%  Weight: 198 lb (89.8 kg)    General appearance : A&OX3. NAD. Non-toxic-appearing HEENT: Atraumatic and Normocephalic.  PERRLA. EOM intact.  TM clear B. Mouth-MMM, post pharynx WNL w/o erythema, No PND. TMJ  joints TTP B; no dislocation of the joint Neck: supple, no JVD. No cervical lymphadenopathy. No thyromegaly Chest/Lungs:  Breathing-non-labored, Good air entry bilaterally, breath sounds normal without rales, rhonchi, or wheezing  CVS: S1 S2 regular, no murmurs, gallops, rubs  Extremities: Bilateral Lower Ext shows no edema, both legs are warm to touch with = pulse throughout Neurology:  CN II-XII grossly intact, Non focal.   Psych:  TP linear. J/I WNL. Normal speech. Appropriate eye contact and affect.  Skin:  No Rash  Data Review Lab Results  Component Value Date   HGBA1C 5.10 12/04/2014   HGBA1C 5.0 02/22/2013     Assessment & Plan   1. TMJ (temporomandibular joint syndrome) likely inflamed due to significant dental procedures and still getting partials properly fitted- - methocarbamol (ROBAXIN) 500 MG tablet; Take 1 tablet (500 mg total) 3 (three) times daily by mouth. X 10 days then prn  Dispense: 120 tablet; Refill: 0 - naproxen (NAPROSYN) 500 MG tablet; Take 1 tablet (500 mg total) 2 (two) times daily with a meal by mouth. X 10 days then prn pain  Dispense: 60 tablet; Refill: 0 If this doesn't work, consider prednisone.  Also; I advised her to buy an OTC mouth guard.    2. Hypertension, unspecified type Controlled, continue current regimen.    Patient have been counseled extensively about nutrition and exercise  Return in about 2 months (around 12/03/2017) for Dr Jarold Motto.  The patient was given clear instructions to go to ER or return to medical center if symptoms don't improve, worsen or new problems develop. The patient verbalized understanding. The patient was told to call to get lab results if they haven't heard anything in the next week.     Freeman Caldron, PA-C [facility] [place], [place]   10/03/2017, 11:04 AM

## 2017-10-09 ENCOUNTER — Other Ambulatory Visit: Payer: Self-pay | Admitting: Physician Assistant

## 2017-10-09 DIAGNOSIS — B9689 Other specified bacterial agents as the cause of diseases classified elsewhere: Secondary | ICD-10-CM

## 2017-10-09 DIAGNOSIS — N760 Acute vaginitis: Principal | ICD-10-CM

## 2017-10-16 ENCOUNTER — Ambulatory Visit: Payer: Self-pay | Attending: Internal Medicine | Admitting: *Deleted

## 2017-10-16 DIAGNOSIS — R8290 Unspecified abnormal findings in urine: Secondary | ICD-10-CM | POA: Insufficient documentation

## 2017-10-16 LAB — POCT URINALYSIS DIPSTICK
Bilirubin, UA: NEGATIVE
Glucose, UA: NEGATIVE
KETONES UA: NEGATIVE
Nitrite, UA: NEGATIVE
PH UA: 6.5 (ref 5.0–8.0)
PROTEIN UA: NEGATIVE
SPEC GRAV UA: 1.01 (ref 1.010–1.025)
Urobilinogen, UA: 1 E.U./dL

## 2017-10-16 NOTE — Progress Notes (Signed)
Patient Triage Assessment Form Todays Date: 10/16/2017 Name: Katelyn Hardin DOB: 1971/02/03 Reason for walkin: check for UTI When did your symptoms start? Last night Please list symptoms: odor and cloudy urine Are you having pain: yes- has chronic back pain  Denies dysuria, urgency, frequency, fever or polyuria.  Plan Pt has not taken Flagyl that was sent to pharmacy 10/11/2017. She states she remember calling for medication but did not know it was at the pharmacy.  Encouraged patient to take medication as prescribed.  Instructed to call for OV if sx's persist.

## 2017-10-17 ENCOUNTER — Other Ambulatory Visit: Payer: Self-pay | Admitting: *Deleted

## 2017-10-17 DIAGNOSIS — R300 Dysuria: Secondary | ICD-10-CM

## 2017-10-17 LAB — POCT URINALYSIS DIPSTICK
BILIRUBIN UA: NEGATIVE
GLUCOSE UA: NEGATIVE
KETONES UA: NEGATIVE
Nitrite, UA: NEGATIVE
Protein, UA: 100
SPEC GRAV UA: 1.025 (ref 1.010–1.025)
Urobilinogen, UA: 1 E.U./dL
pH, UA: 6.5 (ref 5.0–8.0)

## 2017-10-17 MED ORDER — SULFAMETHOXAZOLE-TRIMETHOPRIM 800-160 MG PO TABS: 1.0000 | ORAL_TABLET | Freq: Two times a day (BID) | ORAL | 0 refills | Status: DC

## 2017-10-26 ENCOUNTER — Other Ambulatory Visit: Payer: Self-pay | Admitting: Internal Medicine

## 2017-10-26 DIAGNOSIS — N760 Acute vaginitis: Principal | ICD-10-CM

## 2017-10-26 DIAGNOSIS — B9689 Other specified bacterial agents as the cause of diseases classified elsewhere: Secondary | ICD-10-CM

## 2017-10-26 MED ORDER — METRONIDAZOLE 500 MG PO TABS: 500.0000 mg | ORAL_TABLET | Freq: Two times a day (BID) | ORAL | 0 refills | Status: DC

## 2017-10-27 ENCOUNTER — Encounter: Payer: Self-pay | Admitting: Internal Medicine

## 2018-01-10 ENCOUNTER — Other Ambulatory Visit: Payer: Self-pay | Admitting: Internal Medicine

## 2018-01-10 DIAGNOSIS — I10 Essential (primary) hypertension: Secondary | ICD-10-CM

## 2018-02-12 ENCOUNTER — Other Ambulatory Visit: Payer: Self-pay | Admitting: Physician Assistant

## 2018-02-12 ENCOUNTER — Other Ambulatory Visit: Payer: Self-pay | Admitting: Internal Medicine

## 2018-02-12 DIAGNOSIS — M26609 Unspecified temporomandibular joint disorder, unspecified side: Secondary | ICD-10-CM

## 2018-02-12 DIAGNOSIS — I10 Essential (primary) hypertension: Secondary | ICD-10-CM

## 2018-02-12 MED ORDER — LISINOPRIL 10 MG PO TABS: 10.0000 mg | ORAL_TABLET | Freq: Every day | ORAL | 0 refills | Status: DC

## 2018-02-12 MED ORDER — HYDROCHLOROTHIAZIDE 25 MG PO TABS: 25.0000 mg | ORAL_TABLET | Freq: Every day | ORAL | 0 refills | Status: DC

## 2018-02-12 NOTE — Telephone Encounter (Signed)
Refill request

## 2018-02-15 ENCOUNTER — Telehealth: Payer: Self-pay | Admitting: Internal Medicine

## 2018-02-15 NOTE — Telephone Encounter (Signed)
Pt came in to schedule an appt with her new provider, she would like a call back to say her goodbyes to her nurse. Please call her back

## 2018-03-21 ENCOUNTER — Other Ambulatory Visit: Payer: Self-pay | Admitting: Internal Medicine

## 2018-03-21 DIAGNOSIS — I10 Essential (primary) hypertension: Secondary | ICD-10-CM

## 2018-03-22 ENCOUNTER — Ambulatory Visit: Payer: Self-pay | Attending: Family Medicine | Admitting: Family Medicine

## 2018-03-22 ENCOUNTER — Encounter: Payer: Self-pay | Admitting: Family Medicine

## 2018-03-22 VITALS — BP 111/74 | HR 65 | Temp 97.9°F | Ht 66.0 in | Wt 195.4 lb

## 2018-03-22 DIAGNOSIS — R1906 Epigastric swelling, mass or lump: Secondary | ICD-10-CM | POA: Insufficient documentation

## 2018-03-22 DIAGNOSIS — H8110 Benign paroxysmal vertigo, unspecified ear: Secondary | ICD-10-CM | POA: Insufficient documentation

## 2018-03-22 DIAGNOSIS — Z79899 Other long term (current) drug therapy: Secondary | ICD-10-CM | POA: Insufficient documentation

## 2018-03-22 DIAGNOSIS — R5383 Other fatigue: Secondary | ICD-10-CM | POA: Insufficient documentation

## 2018-03-22 DIAGNOSIS — Z9851 Tubal ligation status: Secondary | ICD-10-CM | POA: Insufficient documentation

## 2018-03-22 DIAGNOSIS — I10 Essential (primary) hypertension: Secondary | ICD-10-CM | POA: Insufficient documentation

## 2018-03-22 DIAGNOSIS — H8113 Benign paroxysmal vertigo, bilateral: Secondary | ICD-10-CM

## 2018-03-22 DIAGNOSIS — N3000 Acute cystitis without hematuria: Secondary | ICD-10-CM | POA: Insufficient documentation

## 2018-03-22 DIAGNOSIS — N951 Menopausal and female climacteric states: Secondary | ICD-10-CM | POA: Insufficient documentation

## 2018-03-22 DIAGNOSIS — M5431 Sciatica, right side: Secondary | ICD-10-CM | POA: Insufficient documentation

## 2018-03-22 DIAGNOSIS — M5432 Sciatica, left side: Secondary | ICD-10-CM | POA: Insufficient documentation

## 2018-03-22 DIAGNOSIS — Z885 Allergy status to narcotic agent status: Secondary | ICD-10-CM | POA: Insufficient documentation

## 2018-03-22 DIAGNOSIS — Z886 Allergy status to analgesic agent status: Secondary | ICD-10-CM | POA: Insufficient documentation

## 2018-03-22 LAB — POCT URINALYSIS DIPSTICK
Bilirubin, UA: NEGATIVE
GLUCOSE UA: NEGATIVE
Ketones, UA: NEGATIVE
NITRITE UA: NEGATIVE
SPEC GRAV UA: 1.02 (ref 1.010–1.025)
Urobilinogen, UA: 1 E.U./dL
pH, UA: 7 (ref 5.0–8.0)

## 2018-03-22 MED ORDER — NAPROXEN 500 MG PO TABS: 500.0000 mg | ORAL_TABLET | Freq: Two times a day (BID) | ORAL | 0 refills | Status: DC

## 2018-03-22 MED ORDER — HYDROCHLOROTHIAZIDE 12.5 MG PO TABS: 12.5000 mg | ORAL_TABLET | Freq: Every day | ORAL | 6 refills | Status: DC

## 2018-03-22 MED ORDER — MECLIZINE HCL 25 MG PO TABS: 25.0000 mg | ORAL_TABLET | Freq: Three times a day (TID) | ORAL | 3 refills | Status: DC | PRN

## 2018-03-22 MED ORDER — CETIRIZINE HCL 10 MG PO TABS: 10.0000 mg | ORAL_TABLET | Freq: Every day | ORAL | 3 refills | Status: DC

## 2018-03-22 MED ORDER — CLONIDINE HCL 0.1 MG PO TABS: 0.1000 mg | ORAL_TABLET | Freq: Every day | ORAL | 3 refills | Status: DC

## 2018-03-22 MED ORDER — SULFAMETHOXAZOLE-TRIMETHOPRIM 800-160 MG PO TABS: 1.0000 | ORAL_TABLET | Freq: Two times a day (BID) | ORAL | 0 refills | Status: DC

## 2018-03-22 MED ORDER — LISINOPRIL 10 MG PO TABS: 10.0000 mg | ORAL_TABLET | Freq: Every day | ORAL | 6 refills | Status: DC

## 2018-03-22 NOTE — Progress Notes (Signed)
Subjective:  Patient ID: Katelyn Hardin, female    DOB: 09-Feb-1971  Age: 47 y.o. MRN: [id number]  CC: Establish Care; Hot Flashes; and Fatigue   HPI Katelyn Hardin is a 47 year old female with a history of hypertension, vertigo, sciatica who presents today to establish care with me. She complains of worsening of her sciatica which wakes her up at night and radiates down her bilateral lower extremities alternating and uses Robaxin intermittently; pain is absent at this time.  Denies numbness in extremities or loss of sphincter function. Her hot flashes have also worsened and occur on most nights of the week.  She has had hot flashes for the last 10 years and attained menopause 1-1/2 years ago.  Previously prescribed Estrace cream for vaginal dryness by her PCP but discontinued this as it made her vagina  messy when she attempted sexual intercourse. She complains of dysuria and use it AZO pills with no improvement in symptoms; has also noted urinary frequency but no flank pain, abdominal pain or nausea. She complains of fatigue even on mild exertion and states her sleep is interrupted due to her sciatica but then her fatigue is overwhelming with little energy to do things.   Past Medical History:  Diagnosis Date  . Anemia   . Bacterial vaginosis   . Fibroids   . Fibromyalgia   . Hypertension   . IBS (irritable bowel syndrome)     Past Surgical History:  Procedure Laterality Date  . TUBAL LIGATION    . TUBAL LIGATION      Allergies  Allergen Reactions  . Acetaminophen Other (See Comments)    "Bothers her- jittery and nausea."  . Aspirin Nausea And Vomiting  . Codeine     Upset stomach  . Oxycodone-Acetaminophen     Upset stomach  . Vicodin [Hydrocodone-Acetaminophen] Nausea Only     Outpatient Medications Prior to Visit  Medication Sig Dispense Refill  . clotrimazole-betamethasone (LOTRISONE) cream Apply 1 application topically 2 (two) times daily. 45 g 3  .  methocarbamol (ROBAXIN) 500 MG tablet Take 1 tablet (500 mg total) 3 (three) times daily by mouth. X 10 days then prn 120 tablet 0  . cetirizine (ZYRTEC) 10 MG tablet Take 1 tablet (10 mg total) by mouth daily. 30 tablet 11  . hydrochlorothiazide (HYDRODIURIL) 25 MG tablet Take 1 tablet (25 mg total) by mouth daily. 30 tablet 0  . lisinopril (PRINIVIL,ZESTRIL) 10 MG tablet Take 1 tablet (10 mg total) by mouth daily. 30 tablet 0  . meclizine (ANTIVERT) 25 MG tablet Take 1 tablet (25 mg total) by mouth 3 (three) times daily as needed for dizziness. 60 tablet 0  . naproxen (NAPROSYN) 500 MG tablet Take 1 tablet (500 mg total) 2 (two) times daily with a meal by mouth. X 10 days then prn pain 60 tablet 0  . fluticasone (FLONASE) 50 MCG/ACT nasal spray Place 2 sprays into both nostrils daily. (Patient not taking: Reported on 03/22/2018) 16 g 2  . gabapentin (NEURONTIN) 100 MG capsule Take 1 capsule (100 mg total) by mouth 3 (three) times daily. (Patient not taking: Reported on 10/03/2017) 90 capsule 3  . Multiple Vitamin (MULTIVITAMIN WITH MINERALS) TABS tablet Take 1 tablet by mouth daily. (Patient not taking: Reported on 03/22/2018) 90 tablet 3  . escitalopram (LEXAPRO) 10 MG tablet TAKE 1 TABLET BY MOUTH DAILY. (Patient not taking: Reported on 10/03/2017) 30 tablet 3  . estradiol (ESTRACE) 0.1 MG/GM vaginal cream Place 1 Applicatorful vaginally at bedtime. (Patient  not taking: Reported on 03/22/2018) 42.5 g 12  . metroNIDAZOLE (FLAGYL) 500 MG tablet Take 1 tablet (500 mg total) by mouth 2 (two) times daily. (Patient not taking: Reported on 03/22/2018) 14 tablet 0  . sulfamethoxazole-trimethoprim (BACTRIM DS,SEPTRA DS) 800-160 MG tablet Take 1 tablet by mouth 2 (two) times daily. (Patient not taking: Reported on 03/22/2018) 6 tablet 0   No facility-administered medications prior to visit.     ROS Review of Systems  Constitutional: Positive for fatigue. Negative for activity change and appetite change.  HENT:  Negative for congestion, sinus pressure and sore throat.   Eyes: Negative for visual disturbance.  Respiratory: Negative for cough, chest tightness, shortness of breath and wheezing.   Cardiovascular: Negative for chest pain and palpitations.  Gastrointestinal: Negative for abdominal distention, abdominal pain and constipation.  Endocrine: Negative for polydipsia.  Genitourinary: Positive for dysuria. Negative for frequency.  Musculoskeletal: Positive for back pain. Negative for arthralgias.  Skin: Negative for rash.  Neurological: Negative for tremors, light-headedness and numbness.  Hematological: Does not bruise/bleed easily.  Psychiatric/Behavioral: Positive for sleep disturbance. Negative for agitation and behavioral problems.    Objective:  BP 111/74   Pulse 65   Temp 97.9 F (36.6 C) (Oral)   Ht _0  (1.676 m)   Wt 195 lb 6.4 oz (88.6 kg)   SpO2 97%   BMI 31.54 kg/m   BP/Weight 03/22/2018 10/03/2017 7/86/7672  Systolic BP 094 709 628  Diastolic BP 74 86 77  Wt. (Lbs) 195.4 198 203.8  BMI 31.54 31.96 32.89      Physical Exam  Constitutional: She is oriented to person, place, and time. She appears well-developed and well-nourished.  Cardiovascular: Normal rate, normal heart sounds and intact distal pulses.  No murmur heard. Pulmonary/Chest: Effort normal and breath sounds normal. She has no wheezes. She has no rales. She exhibits no tenderness.  Abdominal: Soft. Bowel sounds are normal. She exhibits no distension and no mass. There is no tenderness.  Musculoskeletal: Normal range of motion. She exhibits no tenderness.  Negative straight leg raise  Neurological: She is alert and oriented to person, place, and time.  Skin: Skin is warm and dry.  Psychiatric: She has a normal mood and affect.     CMP Latest Ref Rng & Units 09/15/2016 08/03/2016 07/07/2014  Glucose 65 - 99 mg/dL 82 100(H) 92  BUN 7 - 25 mg/dL _1 Creatinine 0.50 - 1.10 mg/dL 0.90 0.97 0.63    Sodium 135 - 146 mmol/L 139 139 140  Potassium 3.5 - 5.3 mmol/L 3.9 3.3(L) 4.3  Chloride 98 - 110 mmol/L 105 106 108  CO2 20 - 31 mmol/L _2 Calcium 8.6 - 10.2 mg/dL 11.5(H) 11.8(H) 11.2(H)  Total Protein 6.5 - 8.1 g/dL - 8.4(H) 7.0  Total Bilirubin 0.3 - 1.2 mg/dL - 0.4 0.3  Alkaline Phos 38 - 126 U/L - 86 77  AST 15 - 41 U/L - 30 29  ALT 14 - 54 U/L - 37 35       Assessment & Plan:   1. Essential hypertension, benign Controlled Decrease dose of hydrochlorothiazide given I am adding clonidine to her regimen - lisinopril (PRINIVIL,ZESTRIL) 10 MG tablet; Take 1 tablet (10 mg total) by mouth daily.  Dispense: 30 tablet; Refill: 6 - hydrochlorothiazide (HYDRODIURIL) 12.5 MG tablet; Take 1 tablet (12.5 mg total) by mouth daily.  Dispense: 30 tablet; Refill: 6 - CMP14+EGFR - Lipid panel - cetirizine (ZYRTEC) 10 MG tablet; Take  1 tablet (10 mg total) by mouth daily.  Dispense: 30 tablet; Refill: 3  2. Bilateral sciatica Uncontrolled Will refer to PT - naproxen (NAPROSYN) 500 MG tablet; Take 1 tablet (500 mg total) by mouth 2 (two) times daily with a meal. X 10 days then prn pain  Dispense: 60 tablet; Refill: 0 - Ambulatory referral to Physical Therapy  3. Benign paroxysmal positional vertigo, bilateral Stable - meclizine (ANTIVERT) 25 MG tablet; Take 1 tablet (25 mg total) by mouth 3 (three) times daily as needed for dizziness.  Dispense: 60 tablet; Refill: 3  4. Hot flashes due to menopause Previously on Estrace for vaginal dryness from her previous PCP but discontinued this as she did not like it. - cloNIDine (CATAPRES) 0.1 MG tablet; Take 1 tablet (0.1 mg total) by mouth daily. For hot flashes  Dispense: 30 tablet; Refill: 3  5. Other fatigue - VITAMIN D 25 Hydroxy (Vit-D Deficiency, Fractures) - TSH - CBC with Differential/Platelet  6. Acute cystitis without hematuria Urinalysis positive for UTI - sulfamethoxazole-trimethoprim (BACTRIM DS,SEPTRA DS) 800-160 MG  tablet; Take 1 tablet by mouth 2 (two) times daily.  Dispense: 6 tablet; Refill: 0  7. Epigastric mass Epigastric mass could be hiatal hernia versus constipation Increase fiber intake - DG Abd 2 Views; Future   Meds ordered this encounter  Medications  . lisinopril (PRINIVIL,ZESTRIL) 10 MG tablet    Sig: Take 1 tablet (10 mg total) by mouth daily.    Dispense:  30 tablet    Refill:  6    Discontinue previous dose  . hydrochlorothiazide (HYDRODIURIL) 12.5 MG tablet    Sig: Take 1 tablet (12.5 mg total) by mouth daily.    Dispense:  30 tablet    Refill:  6    Discontinue previous dose  . cloNIDine (CATAPRES) 0.1 MG tablet    Sig: Take 1 tablet (0.1 mg total) by mouth daily. For hot flashes    Dispense:  30 tablet    Refill:  3  . cetirizine (ZYRTEC) 10 MG tablet    Sig: Take 1 tablet (10 mg total) by mouth daily.    Dispense:  30 tablet    Refill:  3  . meclizine (ANTIVERT) 25 MG tablet    Sig: Take 1 tablet (25 mg total) by mouth 3 (three) times daily as needed for dizziness.    Dispense:  60 tablet    Refill:  3  . naproxen (NAPROSYN) 500 MG tablet    Sig: Take 1 tablet (500 mg total) by mouth 2 (two) times daily with a meal. X 10 days then prn pain    Dispense:  60 tablet    Refill:  0  . sulfamethoxazole-trimethoprim (BACTRIM DS,SEPTRA DS) 800-160 MG tablet    Sig: Take 1 tablet by mouth 2 (two) times daily.    Dispense:  6 tablet    Refill:  0    Follow-up: Return in about 3 months (around 06/22/2018) for Follow-up of chronic medical conditions.   Charlott Rakes MD

## 2018-03-22 NOTE — Patient Instructions (Signed)
Menopause Menopause is the normal time of life when menstrual periods stop completely. Menopause is complete when you have missed 12 consecutive menstrual periods. It usually occurs between the ages of 48 years and 55 years. Very rarely does a woman develop menopause before the age of 40 years. At menopause, your ovaries stop producing the female hormones estrogen and progesterone. This can cause undesirable symptoms and also affect your health. Sometimes the symptoms may occur 4-5 years before the menopause begins. There is no relationship between menopause and:  Oral contraceptives.  Number of children you had.  Race.  The age your menstrual periods started (menarche).  Heavy smokers and very thin women may develop menopause earlier in life. What are the causes?  The ovaries stop producing the female hormones estrogen and progesterone. Other causes include:  Surgery to remove both ovaries.  The ovaries stop functioning for no known reason.  Tumors of the pituitary gland in the brain.  Medical disease that affects the ovaries and hormone production.  Radiation treatment to the abdomen or pelvis.  Chemotherapy that affects the ovaries.  What are the signs or symptoms?  Hot flashes.  Night sweats.  Decrease in sex drive.  Vaginal dryness and thinning of the vagina causing painful intercourse.  Dryness of the skin and developing wrinkles.  Headaches.  Tiredness.  Irritability.  Memory problems.  Weight gain.  Bladder infections.  Hair growth of the face and chest.  Infertility. More serious symptoms include:  Loss of bone (osteoporosis) causing breaks (fractures).  Depression.  Hardening and narrowing of the arteries (atherosclerosis) causing heart attacks and strokes.  How is this diagnosed?  When the menstrual periods have stopped for 12 straight months.  Physical exam.  Hormone studies of the blood. How is this treated? There are many treatment  choices and nearly as many questions about them. The decisions to treat or not to treat menopausal changes is an individual choice made with your health care provider. Your health care provider can discuss the treatments with you. Together, you can decide which treatment will work best for you. Your treatment choices may include:  Hormone therapy (estrogen and progesterone).  Non-hormonal medicines.  Treating the individual symptoms with medicine (for example antidepressants for depression).  Herbal medicines that may help specific symptoms.  Counseling by a psychiatrist or psychologist.  Group therapy.  Lifestyle changes including: ? Eating healthy. ? Regular exercise. ? Limiting caffeine and alcohol. ? Stress management and meditation.  No treatment.  Follow these instructions at home:  Take the medicine your health care provider gives you as directed.  Get plenty of sleep and rest.  Exercise regularly.  Eat a diet that contains calcium (good for the bones) and soy products (acts like estrogen hormone).  Avoid alcoholic beverages.  Do not smoke.  If you have hot flashes, dress in layers.  Take supplements, calcium, and vitamin D to strengthen bones.  You can use over-the-counter lubricants or moisturizers for vaginal dryness.  Group therapy is sometimes very helpful.  Acupuncture may be helpful in some cases. Contact a health care provider if:  You are not sure you are in menopause.  You are having menopausal symptoms and need advice and treatment.  You are still having menstrual periods after age 55 years.  You have pain with intercourse.  Menopause is complete (no menstrual period for 12 months) and you develop vaginal bleeding.  You need a referral to a specialist (gynecologist, psychiatrist, or psychologist) for treatment. Get help right   away if:  You have severe depression.  You have excessive vaginal bleeding.  You fell and think you have a  broken bone.  You have pain when you urinate.  You develop leg or chest pain.  You have a fast pounding heart beat (palpitations).  You have severe headaches.  You develop vision problems.  You feel a lump in your breast.  You have abdominal pain or severe indigestion. This information is not intended to replace advice given to you by your health care provider. Make sure you discuss any questions you have with your health care provider. Document Released: 01/28/2004 Document Revised: 04/14/2016 Document Reviewed: 06/06/2013 Elsevier Interactive Patient Education  2017 [employer]  

## 2018-03-23 ENCOUNTER — Telehealth: Payer: Self-pay

## 2018-03-23 ENCOUNTER — Encounter: Payer: Self-pay | Admitting: Family Medicine

## 2018-03-23 LAB — CBC WITH DIFFERENTIAL/PLATELET
BASOS: 0 %
Basophils Absolute: 0 10*3/uL (ref 0.0–0.2)
EOS (ABSOLUTE): 0 10*3/uL (ref 0.0–0.4)
EOS: 1 %
HEMATOCRIT: 40.3 % (ref 34.0–46.6)
HEMOGLOBIN: 12.6 g/dL (ref 11.1–15.9)
IMMATURE GRANULOCYTES: 0 %
Immature Grans (Abs): 0 10*3/uL (ref 0.0–0.1)
LYMPHS ABS: 2.7 10*3/uL (ref 0.7–3.1)
Lymphs: 49 %
MCH: 30.3 pg (ref 26.6–33.0)
MCHC: 31.3 g/dL — AB (ref 31.5–35.7)
MCV: 97 fL (ref 79–97)
MONOCYTES: 9 %
MONOS ABS: 0.5 10*3/uL (ref 0.1–0.9)
Neutrophils Absolute: 2.3 10*3/uL (ref 1.4–7.0)
Neutrophils: 41 %
Platelets: 334 10*3/uL (ref 150–379)
RBC: 4.16 x10E6/uL (ref 3.77–5.28)
RDW: 13.9 % (ref 12.3–15.4)
WBC: 5.5 10*3/uL (ref 3.4–10.8)

## 2018-03-23 LAB — CMP14+EGFR
ALT: 34 IU/L — ABNORMAL HIGH (ref 0–32)
AST: 27 IU/L (ref 0–40)
Albumin/Globulin Ratio: 1.4 (ref 1.2–2.2)
Albumin: 4.4 g/dL (ref 3.5–5.5)
Alkaline Phosphatase: 104 IU/L (ref 39–117)
BUN/Creatinine Ratio: 15 (ref 9–23)
BUN: 15 mg/dL (ref 6–24)
Bilirubin Total: 0.3 mg/dL (ref 0.0–1.2)
CO2: 24 mmol/L (ref 20–29)
Calcium: 12.1 mg/dL — ABNORMAL HIGH (ref 8.7–10.2)
Chloride: 106 mmol/L (ref 96–106)
Creatinine, Ser: 0.97 mg/dL (ref 0.57–1.00)
GFR calc Af Amer: 80 mL/min/1.73
GFR calc non Af Amer: 69 mL/min/1.73
Globulin, Total: 3.2 g/dL (ref 1.5–4.5)
Glucose: 87 mg/dL (ref 65–99)
Potassium: 3.9 mmol/L (ref 3.5–5.2)
Sodium: 144 mmol/L (ref 134–144)
Total Protein: 7.6 g/dL (ref 6.0–8.5)

## 2018-03-23 LAB — TSH: TSH: 0.767 u[IU]/mL (ref 0.450–4.500)

## 2018-03-23 LAB — LIPID PANEL
Chol/HDL Ratio: 4.8 ratio — ABNORMAL HIGH (ref 0.0–4.4)
Cholesterol, Total: 200 mg/dL — ABNORMAL HIGH (ref 100–199)
HDL: 42 mg/dL
LDL Calculated: 135 mg/dL — ABNORMAL HIGH (ref 0–99)
Triglycerides: 115 mg/dL (ref 0–149)
VLDL Cholesterol Cal: 23 mg/dL (ref 5–40)

## 2018-03-23 LAB — VITAMIN D 25 HYDROXY (VIT D DEFICIENCY, FRACTURES): Vit D, 25-Hydroxy: 32.2 ng/mL (ref 30.0–100.0)

## 2018-03-23 MED ORDER — POTASSIUM CHLORIDE ER 10 MEQ PO TBCR: 10.0000 meq | EXTENDED_RELEASE_TABLET | Freq: Every day | ORAL | 0 refills | Status: DC

## 2018-03-23 MED ORDER — ERGOCALCIFEROL 1.25 MG (50000 UT) PO CAPS: 50000.0000 [IU] | ORAL_CAPSULE | ORAL | 1 refills | Status: DC

## 2018-03-23 NOTE — Telephone Encounter (Signed)
Patient was called and informed of repeat lab needed in 1 week.

## 2018-03-26 ENCOUNTER — Ambulatory Visit (HOSPITAL_COMMUNITY)
Admission: RE | Admit: 2018-03-26 | Discharge: 2018-03-26 | Disposition: A | Discharge status: Home / Self Care | Payer: Self-pay | Source: Ambulatory Visit | Attending: Family Medicine | Admitting: Family Medicine

## 2018-03-26 DIAGNOSIS — R1906 Epigastric swelling, mass or lump: Secondary | ICD-10-CM | POA: Insufficient documentation

## 2018-03-30 ENCOUNTER — Ambulatory Visit: Payer: Self-pay | Attending: Family Medicine

## 2018-03-30 DIAGNOSIS — R5383 Other fatigue: Secondary | ICD-10-CM | POA: Insufficient documentation

## 2018-03-30 NOTE — Progress Notes (Signed)
Patient here for lab visit only 

## 2018-03-31 LAB — CMP14+EGFR
A/G RATIO: 1.6 (ref 1.2–2.2)
ALT: 39 IU/L — ABNORMAL HIGH (ref 0–32)
AST: 32 IU/L (ref 0–40)
Albumin: 4.1 g/dL (ref 3.5–5.5)
Alkaline Phosphatase: 101 IU/L (ref 39–117)
BUN/Creatinine Ratio: 16 (ref 9–23)
BUN: 12 mg/dL (ref 6–24)
Bilirubin Total: 0.2 mg/dL (ref 0.0–1.2)
CALCIUM: 10.8 mg/dL — AB (ref 8.7–10.2)
CHLORIDE: 109 mmol/L — AB (ref 96–106)
CO2: 21 mmol/L (ref 20–29)
Creatinine, Ser: 0.76 mg/dL (ref 0.57–1.00)
GFR calc Af Amer: 107 mL/min/{1.73_m2} (ref >59)
GFR, EST NON AFRICAN AMERICAN: 93 mL/min/{1.73_m2} (ref >59)
GLOBULIN, TOTAL: 2.6 g/dL (ref 1.5–4.5)
Glucose: 100 mg/dL — ABNORMAL HIGH (ref 65–99)
POTASSIUM: 4.3 mmol/L (ref 3.5–5.2)
SODIUM: 140 mmol/L (ref 134–144)
TOTAL PROTEIN: 6.7 g/dL (ref 6.0–8.5)

## 2018-03-31 LAB — PTH, INTACT AND CALCIUM: PTH: 91 pg/mL — ABNORMAL HIGH (ref 15–65)

## 2018-04-02 ENCOUNTER — Ambulatory Visit: Payer: Self-pay

## 2018-04-02 ENCOUNTER — Other Ambulatory Visit: Payer: Self-pay | Admitting: Family Medicine

## 2018-04-02 DIAGNOSIS — E213 Hyperparathyroidism, unspecified: Secondary | ICD-10-CM

## 2018-04-03 ENCOUNTER — Ambulatory Visit: Payer: Self-pay | Attending: Family Medicine

## 2018-04-09 ENCOUNTER — Ambulatory Visit: Payer: Self-pay | Admitting: Physical Therapy

## 2018-04-09 ENCOUNTER — Ambulatory Visit: Payer: Self-pay

## 2018-06-25 ENCOUNTER — Ambulatory Visit: Payer: Self-pay | Attending: Family Medicine | Admitting: Family Medicine

## 2018-06-25 ENCOUNTER — Encounter: Payer: Self-pay | Admitting: Family Medicine

## 2018-06-25 DIAGNOSIS — Z9889 Other specified postprocedural states: Secondary | ICD-10-CM | POA: Insufficient documentation

## 2018-06-25 DIAGNOSIS — Z09 Encounter for follow-up examination after completed treatment for conditions other than malignant neoplasm: Secondary | ICD-10-CM | POA: Insufficient documentation

## 2018-06-25 DIAGNOSIS — Z8049 Family history of malignant neoplasm of other genital organs: Secondary | ICD-10-CM | POA: Insufficient documentation

## 2018-06-25 DIAGNOSIS — M5430 Sciatica, unspecified side: Secondary | ICD-10-CM | POA: Insufficient documentation

## 2018-06-25 DIAGNOSIS — M797 Fibromyalgia: Secondary | ICD-10-CM | POA: Insufficient documentation

## 2018-06-25 DIAGNOSIS — N951 Menopausal and female climacteric states: Secondary | ICD-10-CM | POA: Insufficient documentation

## 2018-06-25 DIAGNOSIS — Z885 Allergy status to narcotic agent status: Secondary | ICD-10-CM | POA: Insufficient documentation

## 2018-06-25 DIAGNOSIS — R5383 Other fatigue: Secondary | ICD-10-CM | POA: Insufficient documentation

## 2018-06-25 DIAGNOSIS — Z79899 Other long term (current) drug therapy: Secondary | ICD-10-CM | POA: Insufficient documentation

## 2018-06-25 DIAGNOSIS — Z803 Family history of malignant neoplasm of breast: Secondary | ICD-10-CM | POA: Insufficient documentation

## 2018-06-25 DIAGNOSIS — Z791 Long term (current) use of non-steroidal anti-inflammatories (NSAID): Secondary | ICD-10-CM | POA: Insufficient documentation

## 2018-06-25 DIAGNOSIS — Z886 Allergy status to analgesic agent status: Secondary | ICD-10-CM | POA: Insufficient documentation

## 2018-06-25 DIAGNOSIS — I10 Essential (primary) hypertension: Secondary | ICD-10-CM | POA: Insufficient documentation

## 2018-06-25 MED ORDER — CLONIDINE HCL 0.1 MG PO TABS: 0.1000 mg | ORAL_TABLET | Freq: Every day | ORAL | 3 refills | Status: DC

## 2018-06-25 NOTE — Patient Instructions (Signed)

## 2018-06-25 NOTE — Progress Notes (Signed)
Subjective:  Patient ID: Katelyn Hardin, female    DOB: 04-Apr-1971  Age: 47 y.o. MRN: [id number]  CC: Hypertension   HPI Katelyn Hardin is a 47 year old female with a history of hypertension, vertigo, sciatica who presents today for a follow-up visit. She was commenced on clonidine at her last visit for treatment of hot flashes but has not been taking it (she misplaced it) and continues to experience hot flashes at night with insomnia and she suffers from daytime fatigue.  She had received Estrace cream from her previous PCP which made her vagina messy and she discontinued it.  She does have a history of breast cancer in her sister and cervical cancer in her mother. Vitamin D levels and TSH from last month were normal. She suffers from sciatica and has been using Robaxin which does not control her symptoms but does not take gabapentin because she is concerned about the side effects. With regards to her hypertension she has been compliant with her antihypertensive and a low-sodium diet but does not exercise regularly.  Past Medical History:  Diagnosis Date  . Anemia   . Bacterial vaginosis   . Fibroids   . Fibromyalgia   . Hypertension   . IBS (irritable bowel syndrome)     Past Surgical History:  Procedure Laterality Date  . TUBAL LIGATION    . TUBAL LIGATION      Allergies  Allergen Reactions  . Acetaminophen Other (See Comments)    "Bothers her- jittery and nausea."  . Aspirin Nausea And Vomiting  . Codeine     Upset stomach  . Oxycodone-Acetaminophen     Upset stomach  . Vicodin [Hydrocodone-Acetaminophen] Nausea Only     Outpatient Medications Prior to Visit  Medication Sig Dispense Refill  . cetirizine (ZYRTEC) 10 MG tablet Take 1 tablet (10 mg total) by mouth daily. 30 tablet 3  . fluticasone (FLONASE) 50 MCG/ACT nasal spray Place 2 sprays into both nostrils daily. 16 g 2  . lisinopril (PRINIVIL,ZESTRIL) 10 MG tablet Take 1 tablet (10 mg total) by mouth daily.  30 tablet 6  . meclizine (ANTIVERT) 25 MG tablet Take 1 tablet (25 mg total) by mouth 3 (three) times daily as needed for dizziness. 60 tablet 3  . methocarbamol (ROBAXIN) 500 MG tablet Take 1 tablet (500 mg total) 3 (three) times daily by mouth. X 10 days then prn 120 tablet 0  . naproxen (NAPROSYN) 500 MG tablet Take 1 tablet (500 mg total) by mouth 2 (two) times daily with a meal. X 10 days then prn pain 60 tablet 0  . cloNIDine (CATAPRES) 0.1 MG tablet Take 1 tablet (0.1 mg total) by mouth daily. For hot flashes 30 tablet 3  . clotrimazole-betamethasone (LOTRISONE) cream Apply 1 application topically 2 (two) times daily. (Patient not taking: Reported on 06/25/2018) 45 g 3  . gabapentin (NEURONTIN) 100 MG capsule Take 1 capsule (100 mg total) by mouth 3 (three) times daily. (Patient not taking: Reported on 10/03/2017) 90 capsule 3  . Multiple Vitamin (MULTIVITAMIN WITH MINERALS) TABS tablet Take 1 tablet by mouth daily. (Patient not taking: Reported on 03/22/2018) 90 tablet 3  . potassium chloride (KLOR-CON 10) 10 MEQ tablet Take 1 tablet (10 mEq total) by mouth daily. (Patient not taking: Reported on 06/25/2018) 30 tablet 0  . sulfamethoxazole-trimethoprim (BACTRIM DS,SEPTRA DS) 800-160 MG tablet Take 1 tablet by mouth 2 (two) times daily. (Patient not taking: Reported on 06/25/2018) 6 tablet 0  . ergocalciferol (DRISDOL) 50000 units capsule  Take 1 capsule (50,000 Units total) by mouth once a week. (Patient not taking: Reported on 06/25/2018) 9 capsule 1   No facility-administered medications prior to visit.     ROS Review of Systems  Constitutional: Positive for fatigue. Negative for activity change and appetite change.  HENT: Negative for congestion, sinus pressure and sore throat.   Eyes: Negative for visual disturbance.  Respiratory: Negative for cough, chest tightness, shortness of breath and wheezing.   Cardiovascular: Negative for chest pain and palpitations.  Gastrointestinal: Negative for  abdominal distention, abdominal pain and constipation.  Endocrine: Negative for polydipsia.  Genitourinary: Negative for dysuria and frequency.  Musculoskeletal: Negative for arthralgias and back pain.  Skin: Negative for rash.  Neurological: Negative for tremors, light-headedness and numbness.  Hematological: Does not bruise/bleed easily.  Psychiatric/Behavioral: Negative for agitation and behavioral problems.    Objective:  BP (!) 146/85   Pulse 69   Temp 98.4 F (36.9 C) (Oral)   Ht 5\' 6"  (1.676 m)   Wt 194 lb 9.6 oz (88.3 kg)   SpO2 99%   BMI 31.41 kg/m   BP/Weight 06/25/2018 03/22/2018 32/95/1884  Systolic BP 166 063 016  Diastolic BP 85 74 86  Wt. (Lbs) 194.6 195.4 198  BMI 31.41 31.54 31.96     Physical Exam  Constitutional: She is oriented to person, place, and time. She appears well-developed and well-nourished.  Cardiovascular: Normal rate, normal heart sounds and intact distal pulses.  No murmur heard. Pulmonary/Chest: Effort normal and breath sounds normal. She has no wheezes. She has no rales. She exhibits no tenderness.  Abdominal: Soft. Bowel sounds are normal. She exhibits no distension and no mass. There is no tenderness.  Musculoskeletal: Normal range of motion. She exhibits no edema or tenderness.  Negative straight leg raise bilaterally  Neurological: She is alert and oriented to person, place, and time.  Skin: Skin is warm and dry.  Psychiatric: She has a normal mood and affect.     CMP Latest Ref Rng & Units 03/30/2018 03/22/2018 09/15/2016  Glucose 65 - 99 mg/dL 100(H) 87 82  BUN 6 - 24 mg/dL 12 15 14   Creatinine 0.57 - 1.00 mg/dL 0.76 0.97 0.90  Sodium 134 - 144 mmol/L 140 144 139  Potassium 3.5 - 5.2 mmol/L 4.3 3.9 3.9  Chloride 96 - 106 mmol/L 109(H) 106 105  CO2 20 - 29 mmol/L 21 24 26   Calcium 8.7 - 10.2 mg/dL 10.8(H) 12.1(H) 11.5(H)  Total Protein 6.0 - 8.5 g/dL 6.7 7.6 -  Total Bilirubin 0.0 - 1.2 mg/dL 0.2 0.3 -  Alkaline Phos 39 - 117  IU/L 101 104 -  AST 0 - 40 IU/L 32 27 -  ALT 0 - 32 IU/L 39(H) 34(H) -    Lab Results  Component Value Date   PTH 91 (H) 03/30/2018   PTH Comment 03/30/2018   CALCIUM 10.8 (H) 03/30/2018    Assessment & Plan:   1. Hot flashes due to menopause Still symptomatic due to not taking clonidine which she promises to resume Advised she can also use OTC Estroven We have discussed risks and benefits of HRT but she is not interested in this time given her family history of breast cancer in her sister and cervical cancer in her mother - cloNIDine (CATAPRES) 0.1 MG tablet; Take 1 tablet (0.1 mg total) by mouth daily. For hot flashes  Dispense: 30 tablet; Refill: 3  2. Hypercalcemia Hydrochlorothiazide had been discontinued and she had been referred to endocrine  but never followed through We will repeat levels and refer if indicated - PTH, Intact and Calcium  3. HYPERTENSION, BENIGN ESSENTIAL Slightly elevated No regimen change today as clonidine resumption could help with optimization of blood pressure Counseled on blood pressure goal of less than 130/80, low-sodium, DASH diet, medication compliance, 150 minutes of moderate intensity exercise per week. Discussed medication compliance, adverse effects.  4. Other fatigue Discussed water aerobics and working on improved sleep by means of sleep hygiene and using of clonidine hopefully this will help with daytime fatigue Vitamin D and TSH previously ordered with normal  5. Sciatica, unspecified laterality Uncontrolled She currently uses Robaxin but is weary of gabapentin due to adverse effects Discussed exercises   Meds ordered this encounter  Medications  . cloNIDine (CATAPRES) 0.1 MG tablet    Sig: Take 1 tablet (0.1 mg total) by mouth daily. For hot flashes    Dispense:  30 tablet    Refill:  3    Follow-up: Return in about 3 months (around 09/25/2018) for Follow-up of chronic medical conditions.   Charlott Rakes MD

## 2018-06-26 ENCOUNTER — Other Ambulatory Visit: Payer: Self-pay | Admitting: Family Medicine

## 2018-06-26 DIAGNOSIS — E213 Hyperparathyroidism, unspecified: Secondary | ICD-10-CM

## 2018-06-26 LAB — PTH, INTACT AND CALCIUM
Calcium: 11.9 mg/dL — ABNORMAL HIGH (ref 8.7–10.2)
PTH: 94 pg/mL — AB (ref 15–65)

## 2018-06-28 ENCOUNTER — Ambulatory Visit: Payer: Self-pay | Admitting: Endocrinology

## 2018-07-02 ENCOUNTER — Ambulatory Visit: Payer: Self-pay | Admitting: Endocrinology

## 2018-07-02 DIAGNOSIS — Z0289 Encounter for other administrative examinations: Secondary | ICD-10-CM

## 2018-07-02 NOTE — Progress Notes (Deleted)
Patient ID: Katelyn Hardin, female   DOB: [birth year], 47 y.o.   MRN: [id number]          Chief complaint: High calcium  History of Present Illness:  Referring physician:   Review of records show that she has had a high calcium since  Lab Results  Component Value Date   CALCIUM 11.9 (H) 06/25/2018   CALCIUM 10.8 (H) 03/30/2018   CALCIUM 12.1 (H) 03/22/2018   CALCIUM 11.5 (H) 09/15/2016   CALCIUM 11.8 (H) 08/03/2016   CALCIUM 11.2 (H) 07/07/2014   CALCIUM 11.0 (H) 06/08/2014   CALCIUM 11.6 (H) 02/07/2014   CALCIUM 11.1 (H) 04/04/2013    The hypercalcemia is not associated with any pathologic fractures, renal insufficiency, nephrolithiasis, sarcoidosis, known carcinoma, hyperthyroidism.   Prior serologic and radiologic studies have included: Lab Results  Component Value Date   PTH 94 (H) 06/25/2018   PTH Comment 06/25/2018   CALCIUM 11.9 (H) 06/25/2018      25 (OH) Vitamin D level    Lab Results  Component Value Date   VD25OH 32.2 03/22/2018   VD25OH 27 (L) 09/15/2016    Prior treatment has included  Allergies as of 07/02/2018      Reactions   Acetaminophen Other (See Comments)   "Bothers her- jittery and nausea."   Aspirin Nausea And Vomiting   Codeine    Upset stomach   Oxycodone-acetaminophen    Upset stomach   Vicodin [hydrocodone-acetaminophen] Nausea Only      Medication List        Accurate as of 07/02/18  8:07 AM. Always use your most recent med list.          cetirizine 10 MG tablet Commonly known as:  ZYRTEC Take 1 tablet (10 mg total) by mouth daily.   cloNIDine 0.1 MG tablet Commonly known as:  CATAPRES Take 1 tablet (0.1 mg total) by mouth daily. For hot flashes   clotrimazole-betamethasone cream Commonly known as:  LOTRISONE Apply 1 application topically 2 (two) times daily.   fluticasone 50 MCG/ACT nasal spray Commonly known as:  FLONASE Place 2 sprays into both nostrils daily.   gabapentin 100 MG capsule Commonly known as:   NEURONTIN Take 1 capsule (100 mg total) by mouth 3 (three) times daily.   lisinopril 10 MG tablet Commonly known as:  PRINIVIL,ZESTRIL Take 1 tablet (10 mg total) by mouth daily.   meclizine 25 MG tablet Commonly known as:  ANTIVERT Take 1 tablet (25 mg total) by mouth 3 (three) times daily as needed for dizziness.   methocarbamol 500 MG tablet Commonly known as:  ROBAXIN Take 1 tablet (500 mg total) 3 (three) times daily by mouth. X 10 days then prn   multivitamin with minerals Tabs tablet Take 1 tablet by mouth daily.   naproxen 500 MG tablet Commonly known as:  NAPROSYN Take 1 tablet (500 mg total) by mouth 2 (two) times daily with a meal. X 10 days then prn pain   potassium chloride 10 MEQ tablet Commonly known as:  K-DUR Take 1 tablet (10 mEq total) by mouth daily.   sulfamethoxazole-trimethoprim 800-160 MG tablet Commonly known as:  BACTRIM DS,SEPTRA DS Take 1 tablet by mouth 2 (two) times daily.       Allergies:  Allergies  Allergen Reactions  . Acetaminophen Other (See Comments)    "Bothers her- jittery and nausea."  . Aspirin Nausea And Vomiting  . Codeine     Upset stomach  . Oxycodone-Acetaminophen  Upset stomach  . Vicodin [Hydrocodone-Acetaminophen] Nausea Only    Past Medical History:  Diagnosis Date  . Anemia   . Bacterial vaginosis   . Fibroids   . Fibromyalgia   . Hypertension   . IBS (irritable bowel syndrome)     Past Surgical History:  Procedure Laterality Date  . TUBAL LIGATION    . TUBAL LIGATION      Family History  Problem Relation Age of Onset  . Thyroid disease Sister   . Cancer Maternal Aunt        breast    Social History:  reports that she has never smoked. She has never used smokeless tobacco. She reports that she drinks about 6.0 standard drinks of alcohol per week. She reports that she does not use drugs.  Review of Systems   EXAM:  There were no vitals taken for this visit.  GENERAL: Averagely built and  nourished  No pallor, clubbing, lymphadenopathy or edema.  Skin:  no rash or pigmentation.  EYES:  Externally normal.  Fundii:  normal discs and vessels.  ENT: Oral mucosa and tongue normal.  THYROID:  Not palpable.   HEART:  Normal  S1 and S2; no murmur or click.  CHEST:  Normal shape Lungs:   Vescicular breath sounds heard equally.  No crepitations/ wheeze.  ABDOMEN:  No distention.  Liver and spleen not palpable.  No other mass or tenderness.  NEUROLOGICAL: .Reflexes are normal bilaterally at biceps.  SPINE AND JOINTS:  Normal.  Assessment/Plan:   HYPERCALCEMIA:  Discussed the nature of primary hyperparathyroidism as well as normal role of the parathyroid glands. Discussed potential  effects of hyperparathyroidism long-term on bone health, kidney stones and kidney function Explained to patient that surgery is indicated only there are symptoms of high calcium, calcium level over 1 point above the normal range or known osteoporosis.  Explained that if surgery is indicated this would be done after doing a parathyroid scan and most likely if the patient has single adenoma will need minimally invasive surgery   Elayne Snare 07/02/2018, 8:07 AM

## 2018-07-30 ENCOUNTER — Encounter: Payer: Self-pay | Admitting: Family Medicine

## 2018-08-19 NOTE — Progress Notes (Signed)
Patient ID: Katelyn Hardin, female   DOB: [birth year], 47 y.o.   MRN: [id number]          Chief complaint: High calcium  History of Present Illness:  Referring physician:   Review of records show that she has had a high calcium since 2011 Calcium has been at least over 11 since 2014 and highest of 12.1 done in 5/19  She says she has been made aware of her high calcium this year Previously had been told to stop taking HCTZ when her calcium was higher Most recent calcium is still persistently high  She does think that she feels tired all the time but this is not new and also is having some issues with sleep and hot flashes   Lab Results  Component Value Date   CALCIUM 11.9 (H) 06/25/2018   CALCIUM 10.8 (H) 03/30/2018   CALCIUM 12.1 (H) 03/22/2018   CALCIUM 11.5 (H) 09/15/2016   CALCIUM 11.8 (H) 08/03/2016   CALCIUM 11.2 (H) 07/07/2014   CALCIUM 11.0 (H) 06/08/2014   CALCIUM 11.6 (H) 02/07/2014   CALCIUM 11.1 (H) 04/04/2013    She does have a history of kidney stone diagnosed in 2017 but no further episodes of pain No further radiological studies are available for stone assessment She has had hypertension for several years Has not had any issues with thyroid problems or known sarcoidosis  Prior serologic and radiologic studies have included:  Lab Results  Component Value Date   PTH 94 (H) 06/25/2018   PTH Comment 06/25/2018   CALCIUM 11.9 (H) 06/25/2018      25 (OH) Vitamin D levels as follows, has been previously given prescription for vitamin D supplements  Lab Results  Component Value Date   VD25OH 32.2 03/22/2018   VD25OH 27 (L) 09/15/2016    Prior treatment has included  Allergies as of 08/20/2018      Reactions   Acetaminophen Other (See Comments)   "Bothers her- jittery and nausea."   Aspirin Nausea And Vomiting   Codeine    Upset stomach   Oxycodone-acetaminophen    Upset stomach   Vicodin [hydrocodone-acetaminophen] Nausea Only      Medication  List        Accurate as of 08/20/18 11:28 AM. Always use your most recent med list.          cetirizine 10 MG tablet Commonly known as:  ZYRTEC Take 1 tablet (10 mg total) by mouth daily.   cloNIDine 0.1 MG tablet Commonly known as:  CATAPRES Take 1 tablet (0.1 mg total) by mouth daily. For hot flashes   clotrimazole-betamethasone cream Commonly known as:  LOTRISONE Apply 1 application topically 2 (two) times daily.   fluticasone 50 MCG/ACT nasal spray Commonly known as:  FLONASE Place 2 sprays into both nostrils daily.   gabapentin 100 MG capsule Commonly known as:  NEURONTIN Take 1 capsule (100 mg total) by mouth 3 (three) times daily.   lisinopril 10 MG tablet Commonly known as:  PRINIVIL,ZESTRIL Take 1 tablet (10 mg total) by mouth daily.   meclizine 25 MG tablet Commonly known as:  ANTIVERT Take 1 tablet (25 mg total) by mouth 3 (three) times daily as needed for dizziness.   methocarbamol 500 MG tablet Commonly known as:  ROBAXIN Take 1 tablet (500 mg total) 3 (three) times daily by mouth. X 10 days then prn   multivitamin with minerals Tabs tablet Take 1 tablet by mouth daily.   naproxen 500 MG tablet Commonly known  as:  NAPROSYN Take 1 tablet (500 mg total) by mouth 2 (two) times daily with a meal. X 10 days then prn pain   potassium chloride 10 MEQ tablet Commonly known as:  K-DUR Take 1 tablet (10 mEq total) by mouth daily.       Allergies:  Allergies  Allergen Reactions  . Acetaminophen Other (See Comments)    "Bothers her- jittery and nausea."  . Aspirin Nausea And Vomiting  . Codeine     Upset stomach  . Oxycodone-Acetaminophen     Upset stomach  . Vicodin [Hydrocodone-Acetaminophen] Nausea Only    Past Medical History:  Diagnosis Date  . Anemia   . Bacterial vaginosis   . Fibroids   . Fibromyalgia   . Hypertension   . IBS (irritable bowel syndrome)     Past Surgical History:  Procedure Laterality Date  . TUBAL LIGATION    .  TUBAL LIGATION      Family History  Problem Relation Age of Onset  . Thyroid disease Sister   . Cancer Maternal Aunt        breast    Social History:  reports that she has never smoked. She has never used smokeless tobacco. She reports that she drinks about 6.0 standard drinks of alcohol per week. She reports that she does not use drugs.  Review of Systems  Constitutional: Positive for weight gain. Negative for reduced appetite.  HENT: Negative for trouble swallowing.   Respiratory: Negative for daytime sleepiness.   Cardiovascular: Negative for palpitations.  Gastrointestinal: Negative for nausea.  Endocrine: Positive for fatigue and heat intolerance.       She has had decreased menstrual cycles since her late 4s and none since 12/17.  Getting hot flashes  Musculoskeletal: Positive for back pain.       Has lower back pain with sciatica at times  Allergic/Immunologic:       Has had allergic rhinitis  Psychiatric/Behavioral: Positive for insomnia.    EXAM:  BP 132/84   Pulse 62   Ht 5\' 6"  (1.676 m)   Wt 190 lb (86.2 kg)   SpO2 99%   BMI 30.67 kg/m   GENERAL: She has mild generalized obesity, some abdominal obesity also present  No pallor, clubbing, lymphadenopathy or edema.  Skin:  no rash or pigmentation.  EYES:  Externally normal.  ENT: Oral mucosa and tongue normal.  THYROID:  Not palpable.  No other mass palpable in the neck   HEART:  Normal  S1 and S2; no murmur or click.  CHEST:  Normal shape Lungs:   Vescicular breath sounds heard equally.  No crepitations/ wheeze.  ABDOMEN:  No distention.  Liver and spleen not palpable.  No other mass or tenderness.  NEUROLOGICAL: .Reflexes are normal bilaterally at biceps.  SPINE AND JOINTS:  Normal shape of spine, no tenderness and no swelling of peripheral joints.   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 Discussed potential  effects of hyperparathyroidism long-term on bone health, kidney stones and kidney function  Explained to patient that parathyroid surgery is indicated since she is under 67 years of age, has calcium  level over 1 point above the normal range and has had a kidney stone at least once and may be at risk for osteopenia Surgery would be done after doing a parathyroid scan and most likely if the patient has single adenoma will need minimally invasive surgery  Patient understands the discussion and is agreeable to going to Dr. Harlow Asa for consultation Parathyroid scan will be ordered also  Katelyn Hardin 08/20/2018, 11:28 AM

## 2018-08-20 ENCOUNTER — Ambulatory Visit (INDEPENDENT_AMBULATORY_CARE_PROVIDER_SITE_OTHER): Payer: Self-pay | Admitting: Endocrinology

## 2018-08-20 ENCOUNTER — Encounter: Payer: Self-pay | Admitting: Endocrinology

## 2018-08-20 ENCOUNTER — Encounter: Payer: Self-pay | Admitting: Family Medicine

## 2018-08-20 ENCOUNTER — Other Ambulatory Visit: Payer: Self-pay | Admitting: Family Medicine

## 2018-08-20 VITALS — BP 132/84 | HR 62 | Ht 66.0 in | Wt 190.0 lb

## 2018-08-20 DIAGNOSIS — M5432 Sciatica, left side: Principal | ICD-10-CM

## 2018-08-20 DIAGNOSIS — Z23 Encounter for immunization: Secondary | ICD-10-CM

## 2018-08-20 DIAGNOSIS — M5431 Sciatica, right side: Secondary | ICD-10-CM

## 2018-08-20 DIAGNOSIS — E213 Hyperparathyroidism, unspecified: Secondary | ICD-10-CM

## 2018-08-21 MED ORDER — NAPROXEN 500 MG PO TABS: 500.0000 mg | ORAL_TABLET | Freq: Two times a day (BID) | ORAL | 0 refills | Status: DC

## 2018-09-05 ENCOUNTER — Encounter: Payer: Self-pay | Admitting: Family Medicine

## 2018-09-05 ENCOUNTER — Other Ambulatory Visit: Payer: Self-pay | Admitting: Internal Medicine

## 2018-09-05 DIAGNOSIS — Z1231 Encounter for screening mammogram for malignant neoplasm of breast: Secondary | ICD-10-CM

## 2018-09-05 DIAGNOSIS — I10 Essential (primary) hypertension: Secondary | ICD-10-CM

## 2018-09-05 DIAGNOSIS — N951 Menopausal and female climacteric states: Secondary | ICD-10-CM

## 2018-09-05 MED ORDER — CLONIDINE HCL 0.1 MG PO TABS: 0.1000 mg | ORAL_TABLET | Freq: Every day | ORAL | 2 refills | Status: DC

## 2018-09-05 MED ORDER — LISINOPRIL 10 MG PO TABS: 10.0000 mg | ORAL_TABLET | Freq: Every day | ORAL | 2 refills | Status: DC

## 2018-09-07 ENCOUNTER — Encounter (HOSPITAL_COMMUNITY)
Admission: RE | Admit: 2018-09-07 | Discharge: 2018-09-07 | Disposition: A | Discharge status: Home / Self Care | Payer: Self-pay | Source: Ambulatory Visit | Attending: Endocrinology | Admitting: Endocrinology

## 2018-09-07 DIAGNOSIS — E213 Hyperparathyroidism, unspecified: Secondary | ICD-10-CM | POA: Insufficient documentation

## 2018-09-07 MED ORDER — TECHNETIUM TC 99M SESTAMIBI - CARDIOLITE
25.0000 | Freq: Once | INTRAVENOUS | Status: AC | PRN
  Administered 2018-09-07: 25 via INTRAVENOUS

## 2018-09-12 ENCOUNTER — Encounter: Payer: Self-pay | Admitting: Family Medicine

## 2018-09-12 ENCOUNTER — Other Ambulatory Visit: Payer: Self-pay | Admitting: Family Medicine

## 2018-09-17 ENCOUNTER — Other Ambulatory Visit (HOSPITAL_COMMUNITY): Payer: Self-pay | Admitting: *Deleted

## 2018-09-17 DIAGNOSIS — Z1231 Encounter for screening mammogram for malignant neoplasm of breast: Secondary | ICD-10-CM

## 2018-11-10 ENCOUNTER — Encounter: Payer: Self-pay | Admitting: Family Medicine

## 2018-11-20 ENCOUNTER — Encounter: Payer: Self-pay | Admitting: Nurse Practitioner

## 2018-11-20 ENCOUNTER — Ambulatory Visit: Payer: Self-pay | Attending: Nurse Practitioner | Admitting: Nurse Practitioner

## 2018-11-20 DIAGNOSIS — M5431 Sciatica, right side: Secondary | ICD-10-CM | POA: Insufficient documentation

## 2018-11-20 DIAGNOSIS — K589 Irritable bowel syndrome without diarrhea: Secondary | ICD-10-CM | POA: Insufficient documentation

## 2018-11-20 DIAGNOSIS — I10 Essential (primary) hypertension: Secondary | ICD-10-CM | POA: Insufficient documentation

## 2018-11-20 DIAGNOSIS — M5432 Sciatica, left side: Secondary | ICD-10-CM | POA: Insufficient documentation

## 2018-11-20 DIAGNOSIS — H8113 Benign paroxysmal vertigo, bilateral: Secondary | ICD-10-CM | POA: Insufficient documentation

## 2018-11-20 DIAGNOSIS — Z79899 Other long term (current) drug therapy: Secondary | ICD-10-CM | POA: Insufficient documentation

## 2018-11-20 DIAGNOSIS — Z7951 Long term (current) use of inhaled steroids: Secondary | ICD-10-CM | POA: Insufficient documentation

## 2018-11-20 DIAGNOSIS — Z8349 Family history of other endocrine, nutritional and metabolic diseases: Secondary | ICD-10-CM | POA: Insufficient documentation

## 2018-11-20 DIAGNOSIS — Z886 Allergy status to analgesic agent status: Secondary | ICD-10-CM | POA: Insufficient documentation

## 2018-11-20 DIAGNOSIS — N951 Menopausal and female climacteric states: Secondary | ICD-10-CM | POA: Insufficient documentation

## 2018-11-20 DIAGNOSIS — H9203 Otalgia, bilateral: Secondary | ICD-10-CM | POA: Insufficient documentation

## 2018-11-20 DIAGNOSIS — R232 Flushing: Secondary | ICD-10-CM | POA: Insufficient documentation

## 2018-11-20 DIAGNOSIS — H65193 Other acute nonsuppurative otitis media, bilateral: Secondary | ICD-10-CM

## 2018-11-20 DIAGNOSIS — M797 Fibromyalgia: Secondary | ICD-10-CM | POA: Insufficient documentation

## 2018-11-20 DIAGNOSIS — Z885 Allergy status to narcotic agent status: Secondary | ICD-10-CM | POA: Insufficient documentation

## 2018-11-20 MED ORDER — CLONIDINE HCL 0.1 MG PO TABS: 0.1000 mg | ORAL_TABLET | Freq: Every day | ORAL | 2 refills | Status: DC

## 2018-11-20 MED ORDER — MECLIZINE HCL 25 MG PO TABS: 25.0000 mg | ORAL_TABLET | Freq: Three times a day (TID) | ORAL | 3 refills | Status: DC | PRN

## 2018-11-20 MED ORDER — PSEUDOEPHEDRINE HCL 60 MG PO TABS: 60.0000 mg | ORAL_TABLET | Freq: Four times a day (QID) | ORAL | 0 refills | Status: AC | PRN

## 2018-11-20 MED ORDER — CLOTRIMAZOLE-BETAMETHASONE 1-0.05 % EX CREA
1.0000 | TOPICAL_CREAM | Freq: Two times a day (BID) | CUTANEOUS | 3 refills | Status: DC
Start: 2018-11-20 — End: 2019-05-20

## 2018-11-20 MED ORDER — FLUTICASONE PROPIONATE 50 MCG/ACT NA SUSP: 1.0000 | Freq: Every day | NASAL | 2 refills | Status: DC

## 2018-11-20 MED ORDER — GABAPENTIN 100 MG PO CAPS: 100.0000 mg | ORAL_CAPSULE | Freq: Three times a day (TID) | ORAL | 3 refills | Status: DC

## 2018-11-20 NOTE — Progress Notes (Signed)
PATIENT STATES THAT SHE FEEL LIKE SHE IS ON AN AIRPLANE.

## 2018-11-20 NOTE — Progress Notes (Signed)
Assessment & Plan:  Katelyn Hardin was seen today for ear pain.  Diagnoses and all orders for this visit:  Acute MEE (middle ear effusion), bilateral -     fluticasone (FLONASE) 50 MCG/ACT nasal spray; Place 1 spray into both nostrils daily. -     pseudoephedrine (SUDAFED) 60 MG tablet; Take 1 tablet (60 mg total) by mouth every 6 (six) hours as needed for up to 7 days for congestion.  Hot flashes due to menopause -     cloNIDine (CATAPRES) 0.1 MG tablet; Take 1 tablet (0.1 mg total) by mouth daily. For hot flashes Chronic. Stable.   Bilateral sciatica -     gabapentin (NEURONTIN) 100 MG capsule; Take 1 capsule (100 mg total) by mouth 3 (three) times daily. Chronic. Stable.   Benign paroxysmal positional vertigo, bilateral -     meclizine (ANTIVERT) 25 MG tablet; Take 1 tablet (25 mg total) by mouth 3 (three) times daily as needed for dizziness.  Other orders -     clotrimazole-betamethasone (LOTRISONE) cream; Apply 1 application topically 2 (two) times daily.  Patient has been counseled on age-appropriate routine health concerns for screening and prevention. These are reviewed and up-to-date. Referrals have been placed accordingly. Immunizations are up-to-date or declined.    Subjective:   Chief Complaint  Patient presents with  . Ear Pain   HPI Katelyn Hardin 47 y.o. female presents to office today with complaints of bilateral ear pain and nasal congestion.    Upper Respiratory Infection: Patient complains of symptoms of a URI. Symptoms include bilateral ear pain, congestion, cough and plugged sensation in both ears. Onset of symptoms was a few weeks ago, unchanged since that time. She denies facial pain, low grade fever, purulent nasal discharge, shortness of breath and sinus pressure.  She is drinking plenty of fluids. Evaluation to date: none. Treatment to date: sudafed (tried for only 1 day), ibuprofen, aleve, flonase (only tried for 2 days).  Review of Systems    Constitutional: Negative for fever, malaise/fatigue and weight loss.  HENT: Positive for congestion and ear pain. Negative for nosebleeds.   Eyes: Negative.  Negative for blurred vision, double vision and photophobia.  Respiratory: Positive for cough. Negative for shortness of breath.   Cardiovascular: Negative.  Negative for chest pain, palpitations and leg swelling.  Gastrointestinal: Negative.  Negative for heartburn, nausea and vomiting.  Musculoskeletal: Positive for back pain. Negative for myalgias.  Neurological: Positive for dizziness. Negative for focal weakness, seizures and headaches.  Psychiatric/Behavioral: Negative.  Negative for suicidal ideas.    Past Medical History:  Diagnosis Date  . Anemia   . Bacterial vaginosis   . Fibroids   . Fibromyalgia   . Hypertension   . IBS (irritable bowel syndrome)     Past Surgical History:  Procedure Laterality Date  . TUBAL LIGATION    . TUBAL LIGATION      Family History  Problem Relation Age of Onset  . Thyroid disease Sister   . Cancer Maternal Aunt        breast  . Diabetes Neg Hx     Social History Reviewed with no changes to be made today.   Outpatient Medications Prior to Visit  Medication Sig Dispense Refill  . cetirizine (ZYRTEC) 10 MG tablet Take 1 tablet (10 mg total) by mouth daily. 30 tablet 3  . lisinopril (PRINIVIL,ZESTRIL) 10 MG tablet Take 1 tablet (10 mg total) by mouth daily. 30 tablet 2  . methocarbamol (ROBAXIN) 500 MG tablet  Take 1 tablet (500 mg total) 3 (three) times daily by mouth. X 10 days then prn 120 tablet 0  . Multiple Vitamin (MULTIVITAMIN WITH MINERALS) TABS tablet Take 1 tablet by mouth daily. 90 tablet 3  . naproxen (NAPROSYN) 500 MG tablet Take 1 tablet (500 mg total) by mouth 2 (two) times daily with a meal. X 10 days then prn pain 60 tablet 0  . potassium chloride (KLOR-CON 10) 10 MEQ tablet Take 1 tablet (10 mEq total) by mouth daily. 30 tablet 0  . cloNIDine (CATAPRES) 0.1 MG  tablet Take 1 tablet (0.1 mg total) by mouth daily. For hot flashes 30 tablet 2  . clotrimazole-betamethasone (LOTRISONE) cream Apply 1 application topically 2 (two) times daily. 45 g 3  . fluticasone (FLONASE) 50 MCG/ACT nasal spray Place 2 sprays into both nostrils daily. 16 g 2  . gabapentin (NEURONTIN) 100 MG capsule Take 1 capsule (100 mg total) by mouth 3 (three) times daily. 90 capsule 3  . meclizine (ANTIVERT) 25 MG tablet Take 1 tablet (25 mg total) by mouth 3 (three) times daily as needed for dizziness. 60 tablet 3   No facility-administered medications prior to visit.     Allergies  Allergen Reactions  . Acetaminophen Other (See Comments)    "Bothers her- jittery and nausea."  . Aspirin Nausea And Vomiting  . Codeine     Upset stomach  . Oxycodone-Acetaminophen     Upset stomach  . Vicodin [Hydrocodone-Acetaminophen] Nausea Only       Objective:    BP (!) 161/97   Pulse 60   Temp 98.2 F (36.8 C) (Oral)   Ht 5\' 6"  (1.676 m)   Wt 194 lb 9.6 oz (88.3 kg)   SpO2 100%   BMI 31.41 kg/m  Wt Readings from Last 3 Encounters:  11/20/18 194 lb 9.6 oz (88.3 kg)  08/20/18 190 lb (86.2 kg)  06/25/18 194 lb 9.6 oz (88.3 kg)    Physical Exam Constitutional:      General: She is not in acute distress.    Appearance: She is well-developed. She is not ill-appearing or toxic-appearing.  HENT:     Head: Normocephalic.     Right Ear: Ear canal and external ear normal. Decreased hearing noted. A middle ear effusion is present.     Left Ear: Ear canal and external ear normal. Decreased hearing noted. A middle ear effusion is present.     Nose: Mucosal edema and rhinorrhea present.     Right Turbinates: Swollen and pale.     Left Turbinates: Swollen and pale.     Right Sinus: No maxillary sinus tenderness or frontal sinus tenderness.     Left Sinus: No maxillary sinus tenderness or frontal sinus tenderness.     Mouth/Throat:     Pharynx: Posterior oropharyngeal erythema  present. No oropharyngeal exudate.     Tonsils: No tonsillar exudate or tonsillar abscesses.  Neck:     Musculoskeletal: Normal range of motion.     Thyroid: No thyromegaly.  Cardiovascular:     Rate and Rhythm: Normal rate and regular rhythm.     Heart sounds: No murmur. No friction rub. No gallop.   Pulmonary:     Effort: Pulmonary effort is normal. No respiratory distress.     Breath sounds: Normal breath sounds. No wheezing or rales.  Chest:     Chest wall: No tenderness.  Abdominal:     General: Bowel sounds are normal.     Palpations: Abdomen  is soft.  Musculoskeletal: Normal range of motion.  Lymphadenopathy:     Cervical: No cervical adenopathy.  Skin:    General: Skin is warm and dry.  Neurological:     Mental Status: She is alert and oriented to person, place, and time.  Psychiatric:        Behavior: Behavior normal.        Thought Content: Thought content normal.        Judgment: Judgment normal.          Patient has been counseled extensively about nutrition and exercise as well as the importance of adherence with medications and regular follow-up. The patient was given clear instructions to go to ER or return to medical center if symptoms don't improve, worsen or new problems develop. The patient verbalized understanding.   Follow-up: Return if symptoms worsen or fail to improve.   Gildardo Pounds, FNP-BC [facility] [place], [place]   11/20/2018, 10:51 AM

## 2018-12-03 ENCOUNTER — Encounter: Payer: Self-pay | Admitting: Nurse Practitioner

## 2018-12-03 NOTE — Telephone Encounter (Signed)
Mychart request

## 2018-12-04 ENCOUNTER — Other Ambulatory Visit: Payer: Self-pay | Admitting: Nurse Practitioner

## 2018-12-04 DIAGNOSIS — H6590 Unspecified nonsuppurative otitis media, unspecified ear: Secondary | ICD-10-CM

## 2018-12-05 ENCOUNTER — Other Ambulatory Visit: Payer: Self-pay | Admitting: Nurse Practitioner

## 2018-12-05 NOTE — Telephone Encounter (Signed)
Patient would like ENT referral.

## 2018-12-11 ENCOUNTER — Ambulatory Visit (HOSPITAL_COMMUNITY): Payer: Self-pay

## 2018-12-21 ENCOUNTER — Other Ambulatory Visit: Payer: Self-pay | Admitting: Internal Medicine

## 2018-12-21 DIAGNOSIS — I10 Essential (primary) hypertension: Secondary | ICD-10-CM

## 2018-12-24 ENCOUNTER — Ambulatory Visit (INDEPENDENT_AMBULATORY_CARE_PROVIDER_SITE_OTHER): Payer: Self-pay | Admitting: Endocrinology

## 2018-12-24 ENCOUNTER — Encounter: Payer: Self-pay | Admitting: Endocrinology

## 2018-12-24 VITALS — BP 142/88 | HR 68 | Ht 66.0 in | Wt 195.6 lb

## 2018-12-24 DIAGNOSIS — E213 Hyperparathyroidism, unspecified: Secondary | ICD-10-CM

## 2018-12-24 LAB — BASIC METABOLIC PANEL
BUN: 13 mg/dL (ref 6–23)
CALCIUM: 11.8 mg/dL — AB (ref 8.4–10.5)
CO2: 27 mEq/L (ref 19–32)
Chloride: 107 mEq/L (ref 96–112)
Creatinine, Ser: 0.74 mg/dL (ref 0.40–1.20)
GFR: 101.03 mL/min (ref >60.00)
GLUCOSE: 86 mg/dL (ref 70–99)
Potassium: 4.6 mEq/L (ref 3.5–5.1)
Sodium: 140 mEq/L (ref 135–145)

## 2018-12-24 NOTE — Telephone Encounter (Signed)
Received refill request for lisinopril 10 mg but BP was poorly controlled at her appointment today with Dr. Dwyane Dee. Will refill for a 30-day supply only as patient has appointment with PCP next week. Lisinopril dose may need to be adjusted.

## 2018-12-24 NOTE — Progress Notes (Signed)
Patient ID: Katelyn Hardin, female   DOB: 10-Nov-1971, 48 y.o.   MRN: [id number]          Chief complaint: High calcium  History of Present Illness:  Referring physician:   Review of records show that she has had a high calcium since 2011 Calcium has been at least over 11 since 2014 and highest of 12.1 done in 5/19 Previously had been told to stop taking HCTZ when her calcium was higher  She has history of kidney stones She had a high PTH level documented However parathyroid scan did not show a localized parathyroid activity  She was supposed to see the surgeon for consultation but she is not clear why this appointment was not made even though referral was done and faxed to the surgeon's office  She thinks that she is having problems with fatigue and weight gain which is persistent  No recent labs available   Lab Results  Component Value Date   CALCIUM 11.9 (H) 06/25/2018   CALCIUM 10.8 (H) 03/30/2018   CALCIUM 12.1 (H) 03/22/2018   CALCIUM 11.5 (H) 09/15/2016   CALCIUM 11.8 (H) 08/03/2016   CALCIUM 11.2 (H) 07/07/2014   CALCIUM 11.0 (H) 06/08/2014   CALCIUM 11.6 (H) 02/07/2014   CALCIUM 11.1 (H) 04/04/2013    She does have a history of kidney stone diagnosed in 2017 but no further episodes of pain Previously seen by urologist  No further radiological studies are available for stone assessment  Prior serologic and radiologic studies have included:  Lab Results  Component Value Date   PTH 94 (H) 06/25/2018   PTH Comment 06/25/2018   CALCIUM 11.9 (H) 06/25/2018      25 (OH) Vitamin D levels as follows, has been previously given prescription for vitamin D supplements  Lab Results  Component Value Date   VD25OH 32.2 03/22/2018   VD25OH 27 (L) 09/15/2016    Parathyroid scan was negative although showed activity in the left upper pole of the thyroid within the thyroid    Allergies as of 12/24/2018      Reactions   Acetaminophen Other (See Comments)   "Bothers  her- jittery and nausea."   Aspirin Nausea And Vomiting   Codeine    Upset stomach   Oxycodone-acetaminophen    Upset stomach   Vicodin [hydrocodone-acetaminophen] Nausea Only      Medication List       Accurate as of December 24, 2018 10:10 AM. Always use your most recent med list.        cetirizine 10 MG tablet Commonly known as:  ZYRTEC Take 1 tablet (10 mg total) by mouth daily.   cloNIDine 0.1 MG tablet Commonly known as:  CATAPRES Take 1 tablet (0.1 mg total) by mouth daily. For hot flashes   clotrimazole-betamethasone cream Commonly known as:  LOTRISONE Apply 1 application topically 2 (two) times daily.   fluticasone 50 MCG/ACT nasal spray Commonly known as:  FLONASE Place 1 spray into both nostrils daily.   gabapentin 100 MG capsule Commonly known as:  NEURONTIN Take 1 capsule (100 mg total) by mouth 3 (three) times daily.   lisinopril 10 MG tablet Commonly known as:  PRINIVIL,ZESTRIL Take 1 tablet (10 mg total) by mouth daily.   meclizine 25 MG tablet Commonly known as:  ANTIVERT Take 1 tablet (25 mg total) by mouth 3 (three) times daily as needed for dizziness.   methocarbamol 500 MG tablet Commonly known as:  ROBAXIN Take 1 tablet (500 mg total) 3 (  three) times daily by mouth. X 10 days then prn       Allergies:  Allergies  Allergen Reactions  . Acetaminophen Other (See Comments)    "Bothers her- jittery and nausea."  . Aspirin Nausea And Vomiting  . Codeine     Upset stomach  . Oxycodone-Acetaminophen     Upset stomach  . Vicodin [Hydrocodone-Acetaminophen] Nausea Only    Past Medical History:  Diagnosis Date  . Anemia   . Bacterial vaginosis   . Fibroids   . Fibromyalgia   . Hypertension   . IBS (irritable bowel syndrome)     Past Surgical History:  Procedure Laterality Date  . TUBAL LIGATION    . TUBAL LIGATION      Family History  Problem Relation Age of Onset  . Thyroid disease Sister   . Cancer Maternal Aunt         breast  . Diabetes Neg Hx     Social History:  reports that she has never smoked. She has never used smokeless tobacco. She reports current alcohol use of about 6.0 standard drinks of alcohol per week. She reports that she does not use drugs.  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:  She has had persistent hypercalcemia since at least 2014 Calcium levels have been frequently above 11.5 and with a history of kidney stones she has been recommended parathyroid surgery  Her consultation was somehow not completed last year She is coming back for follow-up  We need to recheck her calcium today Also order MRI to evaluate location of the parathyroid in the neck, since there was activity in the left upper pole of the thyroid the parathyroid gland may be embedded in the thyroid or ectopic  She will also collect urine for 24 calcium Surgical consultation will be requested again  Elayne Snare 12/24/2018, 10:10 AM

## 2018-12-26 ENCOUNTER — Other Ambulatory Visit: Payer: Self-pay

## 2018-12-26 DIAGNOSIS — E213 Hyperparathyroidism, unspecified: Secondary | ICD-10-CM

## 2018-12-27 LAB — CREATININE, URINE, 24 HOUR
CREATININE, UR: 67.4 mg/dL
Creatinine, 24H Ur: 1449 mg/24 hr (ref 800–1800)

## 2018-12-27 LAB — CALCIUM, URINE, 24 HOUR
Calcium, 24H Urine: 239 mg/24 hr (ref 47–462)
Calcium, Urine: 11.1 mg/dL

## 2019-01-02 ENCOUNTER — Ambulatory Visit: Payer: Self-pay | Attending: Family Medicine | Admitting: Family Medicine

## 2019-01-02 ENCOUNTER — Encounter: Payer: Self-pay | Admitting: Family Medicine

## 2019-01-02 VITALS — BP 159/91 | HR 64 | Temp 97.7°F | Ht 66.0 in | Wt 198.6 lb

## 2019-01-02 DIAGNOSIS — M797 Fibromyalgia: Secondary | ICD-10-CM | POA: Insufficient documentation

## 2019-01-02 DIAGNOSIS — E213 Hyperparathyroidism, unspecified: Secondary | ICD-10-CM | POA: Insufficient documentation

## 2019-01-02 DIAGNOSIS — M5432 Sciatica, left side: Secondary | ICD-10-CM | POA: Insufficient documentation

## 2019-01-02 DIAGNOSIS — N951 Menopausal and female climacteric states: Secondary | ICD-10-CM | POA: Insufficient documentation

## 2019-01-02 DIAGNOSIS — Z803 Family history of malignant neoplasm of breast: Secondary | ICD-10-CM | POA: Insufficient documentation

## 2019-01-02 DIAGNOSIS — Z885 Allergy status to narcotic agent status: Secondary | ICD-10-CM | POA: Insufficient documentation

## 2019-01-02 DIAGNOSIS — M5431 Sciatica, right side: Secondary | ICD-10-CM | POA: Insufficient documentation

## 2019-01-02 DIAGNOSIS — Z79899 Other long term (current) drug therapy: Secondary | ICD-10-CM | POA: Insufficient documentation

## 2019-01-02 DIAGNOSIS — I10 Essential (primary) hypertension: Secondary | ICD-10-CM | POA: Insufficient documentation

## 2019-01-02 DIAGNOSIS — Z886 Allergy status to analgesic agent status: Secondary | ICD-10-CM | POA: Insufficient documentation

## 2019-01-02 DIAGNOSIS — R232 Flushing: Secondary | ICD-10-CM | POA: Insufficient documentation

## 2019-01-02 MED ORDER — CLONIDINE HCL 0.1 MG PO TABS: 0.1000 mg | ORAL_TABLET | Freq: Every day | ORAL | 1 refills | Status: DC

## 2019-01-02 MED ORDER — GABAPENTIN 300 MG PO CAPS: 300.0000 mg | ORAL_CAPSULE | Freq: Three times a day (TID) | ORAL | 1 refills | Status: DC

## 2019-01-02 MED ORDER — LISINOPRIL-HYDROCHLOROTHIAZIDE 20-25 MG PO TABS: 1.0000 | ORAL_TABLET | Freq: Every day | ORAL | 1 refills | Status: DC

## 2019-01-02 NOTE — Progress Notes (Signed)
Subjective:  Patient ID: Katelyn Hardin, female    DOB: 21-Dec-1970  Age: 48 y.o. MRN: [id number]  CC: Hypertension   HPI Katelyn Hardin is a 48 year old female with a history of hypertension, hyperparathyroidism, vertigo, sciatica who presents today for a follow-up visit. She complains of hot flashes, decreased libido which have been ongoing for the last 4 years.  Hot flashes occur every night and she was prescribed clonidine which she does not take consistently. We had discussed hormone replacement therapy and given her strong family history of breast cancer in her mom and several maternal aunts she had decided against it but is wondering what else can be done.  Received Estrace cream by PCP but complained that this made her vagina messy.  She currently sees endocrine for hyperparathyroidism; last visit on 12/24/2018 at which time MRI of the parathyroid was ordered and she was referred to general surgery. Her blood pressure is elevated and she has been compliant with lisinopril.  Was previously on hydrochlorothiazide which has been discontinued due to hypercalcemia.  Past Medical History:  Diagnosis Date  . Anemia   . Bacterial vaginosis   . Fibroids   . Fibromyalgia   . Hypertension   . IBS (irritable bowel syndrome)     Past Surgical History:  Procedure Laterality Date  . TUBAL LIGATION    . TUBAL LIGATION      Family History  Problem Relation Age of Onset  . Thyroid disease Sister   . Cancer Maternal Aunt        breast  . Diabetes Neg Hx     Allergies  Allergen Reactions  . Acetaminophen Other (See Comments)    "Bothers her- jittery and nausea."  . Aspirin Nausea And Vomiting  . Codeine     Upset stomach  . Oxycodone-Acetaminophen     Upset stomach  . Vicodin [Hydrocodone-Acetaminophen] Nausea Only    Outpatient Medications Prior to Visit  Medication Sig Dispense Refill  . cetirizine (ZYRTEC) 10 MG tablet Take 1 tablet (10 mg total) by mouth daily. 30 tablet  3  . cloNIDine (CATAPRES) 0.1 MG tablet Take 1 tablet (0.1 mg total) by mouth daily. For hot flashes 30 tablet 2  . clotrimazole-betamethasone (LOTRISONE) cream Apply 1 application topically 2 (two) times daily. (Patient taking differently: Apply 1 application topically 2 (two) times daily as needed. ) 45 g 3  . fluticasone (FLONASE) 50 MCG/ACT nasal spray Place 1 spray into both nostrils daily. 16 g 2  . meclizine (ANTIVERT) 25 MG tablet Take 1 tablet (25 mg total) by mouth 3 (three) times daily as needed for dizziness. 60 tablet 3  . methocarbamol (ROBAXIN) 500 MG tablet Take 1 tablet (500 mg total) 3 (three) times daily by mouth. X 10 days then prn 120 tablet 0  . gabapentin (NEURONTIN) 100 MG capsule Take 1 capsule (100 mg total) by mouth 3 (three) times daily. 90 capsule 3  . lisinopril (PRINIVIL,ZESTRIL) 10 MG tablet TAKE 1 TABLET (10 MG TOTAL) BY MOUTH DAILY. 30 tablet 0   No facility-administered medications prior to visit.      ROS Review of Systems General: negative for fever, weight loss, appetite change Eyes: no visual symptoms. ENT: no ear symptoms, no sinus tenderness, no nasal congestion or sore throat. Neck: no pain  Respiratory: no wheezing, shortness of breath, cough Cardiovascular: no chest pain, no dyspnea on exertion, no pedal edema, no orthopnea. Gastrointestinal: no abdominal pain, no diarrhea, no constipation Genito-Urinary: no urinary frequency, no dysuria,  no polyuria. Hematologic: no bruising Endocrine: no cold or heat intolerance Neurological: no headaches, no seizures, no tremors Musculoskeletal: no joint pains, no joint swelling Skin: no pruritus, no rash. Psychological: no depression, no anxiety,    Objective:  BP (!) 159/91   Pulse 64   Temp 97.7 F (36.5 C) (Other (Comment))   Ht 5\' 6"  (1.676 m)   Wt 198 lb 9.6 oz (90.1 kg)   SpO2 100%   BMI 32.05 kg/m   BP/Weight 01/02/2019 12/24/2018 04/54/0981  Systolic BP 191 478 295  Diastolic BP 91 88 97   Wt. (Lbs) 198.6 195.6 194.6  BMI 32.05 31.57 31.41      Physical Exam Constitutional: normal appearing,  Eyes: PERRLA HEENT: Head is atraumatic, normal sinuses, normal oropharynx, normal appearing tonsils and palate, tympanic membrane is normal bilaterally. Neck: normal range of motion, no thyromegaly, no JVD Cardiovascular: normal rate and rhythm, normal heart sounds, no murmurs, rub or gallop, no pedal edema Respiratory: Normal breath sounds, clear to auscultation bilaterally, no wheezes, no rales, no rhonchi Abdomen: soft, not tender to palpation, normal bowel sounds, no enlarged organs Musculoskeletal: Full ROM, no tenderness in joints Skin: warm and dry, no lesions. Neurological: alert, oriented x3, cranial nerves I-XII grossly intact , normal motor strength, normal sensation. Psychological: normal mood.   CMP Latest Ref Rng & Units 12/24/2018 06/25/2018 03/30/2018  Glucose 70 - 99 mg/dL 86 - 100(H)  BUN 6 - 23 mg/dL 13 - 12  Creatinine 0.40 - 1.20 mg/dL 0.74 - 0.76  Sodium 135 - 145 mEq/L 140 - 140  Potassium 3.5 - 5.1 mEq/L 4.6 - 4.3  Chloride 96 - 112 mEq/L 107 - 109(H)  CO2 19 - 32 mEq/L 27 - 21  Calcium 8.4 - 10.5 mg/dL 11.8(H) 11.9(H) 10.8(H)  Total Protein 6.0 - 8.5 g/dL - - 6.7  Total Bilirubin 0.0 - 1.2 mg/dL - - 0.2  Alkaline Phos 39 - 117 IU/L - - 101  AST 0 - 40 IU/L - - 32  ALT 0 - 32 IU/L - - 39(H)    Lipid Panel     Component Value Date/Time   CHOL 200 (H) 03/22/2018 1148   TRIG 115 03/22/2018 1148   HDL 42 03/22/2018 1148   CHOLHDL 4.8 (H) 03/22/2018 1148   CHOLHDL 3.6 04/04/2013 1022   VLDL 20 04/04/2013 1022   LDLCALC 135 (H) 03/22/2018 1148    CBC    Component Value Date/Time   WBC 5.5 03/22/2018 1148   WBC 7.2 08/03/2016 1627   RBC 4.16 03/22/2018 1148   RBC 4.28 08/03/2016 1627   HGB 12.6 03/22/2018 1148   HCT 40.3 03/22/2018 1148   PLT 334 03/22/2018 1148   MCV 97 03/22/2018 1148   MCH 30.3 03/22/2018 1148   MCH 31.3 08/03/2016 1627     MCHC 31.3 (L) 03/22/2018 1148   MCHC 33.0 08/03/2016 1627   RDW 13.9 03/22/2018 1148   LYMPHSABS 2.7 03/22/2018 1148   MONOABS 0.5 02/07/2014 1711   EOSABS 0.0 03/22/2018 1148   BASOSABS 0.0 03/22/2018 1148    Lab Results  Component Value Date   HGBA1C 5.10 12/04/2014    Assessment & Plan:   1. Bilateral sciatica Stable We will substitute 100 mg 3 times daily with 300 mg at night hopefully this will help with her hot flashes. - gabapentin (NEURONTIN) 300 MG capsule; Take 1 capsule (300 mg total) by mouth 3 (three) times daily.  Dispense: 90 capsule; Refill: 1  2.  Essential hypertension, benign Uncontrolled Switch from lisinopril to lisinopril/HCTZ Counseled on blood pressure goal of less than 130/80, low-sodium, DASH diet, medication compliance, 150 minutes of moderate intensity exercise per week. Discussed medication compliance, adverse effects.  3. Hyperparathyroidism ([place]) Uncontrolled Scheduled for MRI of the parathyroid and has been referred to surgery Follow-up with endocrine  4. Hot flashes due to menopause Continue with clonidine We have discussed risks and benefits of HRT and she decides against this given her positive family history of breast cancer in her mom and several maternal aunts   Meds ordered this encounter  Medications  . gabapentin (NEURONTIN) 300 MG capsule    Sig: Take 1 capsule (300 mg total) by mouth 3 (three) times daily.    Dispense:  90 capsule    Refill:  1  . lisinopril-hydrochlorothiazide (PRINZIDE,ZESTORETIC) 20-25 MG tablet    Sig: Take 1 tablet by mouth daily.    Dispense:  90 tablet    Refill:  1    Follow-up: Return in about 3 months (around 04/02/2019) for Follow-up of chronic medical conditions.       Charlott Rakes, MD, FAAFP. [facility] [place], [place]   01/02/2019, 11:23 AM

## 2019-01-09 ENCOUNTER — Telehealth: Payer: Self-pay | Admitting: Endocrinology

## 2019-01-09 ENCOUNTER — Other Ambulatory Visit: Payer: Self-pay | Admitting: Family Medicine

## 2019-01-09 ENCOUNTER — Ambulatory Visit: Payer: Self-pay | Attending: Family Medicine

## 2019-01-09 ENCOUNTER — Encounter: Payer: Self-pay | Admitting: Family Medicine

## 2019-01-09 DIAGNOSIS — R399 Unspecified symptoms and signs involving the genitourinary system: Secondary | ICD-10-CM

## 2019-01-09 LAB — POCT URINALYSIS DIP (CLINITEK)
Bilirubin, UA: NEGATIVE
Glucose, UA: NEGATIVE mg/dL
Ketones, POC UA: NEGATIVE mg/dL
NITRITE UA: NEGATIVE
SPEC GRAV UA: 1.015 (ref 1.010–1.025)
UROBILINOGEN UA: 2 U/dL — AB
pH, UA: 6.5 (ref 5.0–8.0)

## 2019-01-09 MED ORDER — SULFAMETHOXAZOLE-TRIMETHOPRIM 800-160 MG PO TABS: 1.0000 | ORAL_TABLET | Freq: Two times a day (BID) | ORAL | 0 refills | Status: DC

## 2019-01-09 NOTE — Telephone Encounter (Signed)
Pt scheduled and made aware. She verbalized understanding.

## 2019-01-09 NOTE — Telephone Encounter (Signed)
Patient Stated she was suppose to have a procedure done at the hospital and has not heard from anyone.  Would like to get an update or a phone number she could call to have this scheduled.   Please advise

## 2019-01-09 NOTE — Progress Notes (Signed)
Patient dropped off a urine specimen due to UTI symptoms.  UA positive for leukocytes.  Bactrim sent to pharmacy and she will be notified via [facility].  Could you please notify her as well in the event that she does not receive my message?  Thank you

## 2019-01-10 NOTE — Progress Notes (Signed)
Patient arrived to clinic to drop off a urine sample. Urine was ran and PCP was informed or results.

## 2019-01-13 ENCOUNTER — Ambulatory Visit (HOSPITAL_COMMUNITY): Admission: RE | Admit: 2019-01-13 | Payer: Self-pay | Source: Ambulatory Visit

## 2019-01-16 ENCOUNTER — Telehealth: Payer: Self-pay | Admitting: Endocrinology

## 2019-01-16 NOTE — Telephone Encounter (Signed)
Pt was calling about urine sample that was resulted by her PCP. Pt was advised to call her PCP.

## 2019-01-16 NOTE — Telephone Encounter (Signed)
Patient is requesting a call back to discuss the lab results she has received,   Please advise

## 2019-01-26 ENCOUNTER — Ambulatory Visit (HOSPITAL_COMMUNITY)
Admission: RE | Admit: 2019-01-26 | Discharge: 2019-01-26 | Disposition: A | Discharge status: Home / Self Care | Payer: Self-pay | Source: Ambulatory Visit | Attending: Endocrinology | Admitting: Endocrinology

## 2019-01-26 DIAGNOSIS — E213 Hyperparathyroidism, unspecified: Secondary | ICD-10-CM | POA: Insufficient documentation

## 2019-01-26 MED ORDER — GADOBUTROL 1 MMOL/ML IV SOLN
9.0000 mL | Freq: Once | INTRAVENOUS | Status: AC | PRN
  Administered 2019-01-26: 9 mL via INTRAVENOUS

## 2019-01-28 ENCOUNTER — Ambulatory Visit: Payer: Self-pay | Admitting: Surgery

## 2019-01-31 ENCOUNTER — Other Ambulatory Visit: Payer: Self-pay | Admitting: Surgery

## 2019-01-31 DIAGNOSIS — E210 Primary hyperparathyroidism: Secondary | ICD-10-CM

## 2019-02-07 ENCOUNTER — Other Ambulatory Visit: Payer: Self-pay

## 2019-02-07 ENCOUNTER — Ambulatory Visit
Admission: RE | Admit: 2019-02-07 | Discharge: 2019-02-07 | Disposition: A | Discharge status: Home / Self Care | Payer: No Typology Code available for payment source | Source: Ambulatory Visit | Attending: Surgery | Admitting: Surgery

## 2019-02-07 DIAGNOSIS — E210 Primary hyperparathyroidism: Secondary | ICD-10-CM

## 2019-02-14 ENCOUNTER — Other Ambulatory Visit: Payer: Self-pay | Admitting: Surgery

## 2019-02-14 DIAGNOSIS — E041 Nontoxic single thyroid nodule: Secondary | ICD-10-CM

## 2019-02-22 ENCOUNTER — Other Ambulatory Visit (HOSPITAL_COMMUNITY): Payer: Self-pay

## 2019-02-25 ENCOUNTER — Telehealth (HOSPITAL_COMMUNITY): Payer: Self-pay

## 2019-02-25 NOTE — Telephone Encounter (Signed)
Left message with patient about [facility] appointment that will need to be rescheduled due to [place]. Left name and number for patient to call back.

## 2019-02-28 ENCOUNTER — Encounter (HOSPITAL_COMMUNITY): Admission: RE | Payer: Self-pay | Source: Home / Self Care

## 2019-02-28 ENCOUNTER — Ambulatory Visit (HOSPITAL_COMMUNITY): Admission: RE | Admit: 2019-02-28 | Payer: Self-pay | Source: Home / Self Care | Admitting: Surgery

## 2019-02-28 SURGERY — PARATHYROIDECTOMY
Anesthesia: General

## 2019-03-07 ENCOUNTER — Telehealth (HOSPITAL_COMMUNITY): Payer: Self-pay

## 2019-03-07 NOTE — Telephone Encounter (Signed)
Left message with patient about [facility] appointment that will need to be rescheduled due to [place]. Left name and number for patient to call back.

## 2019-03-19 ENCOUNTER — Ambulatory Visit (HOSPITAL_COMMUNITY): Payer: Self-pay

## 2019-04-10 ENCOUNTER — Other Ambulatory Visit (HOSPITAL_COMMUNITY)
Admission: RE | Admit: 2019-04-10 | Discharge: 2019-04-10 | Disposition: A | Discharge status: Home / Self Care | Payer: Self-pay | Source: Ambulatory Visit | Attending: Physician Assistant | Admitting: Physician Assistant

## 2019-04-10 ENCOUNTER — Ambulatory Visit
Admission: RE | Admit: 2019-04-10 | Discharge: 2019-04-10 | Disposition: A | Discharge status: Home / Self Care | Payer: Self-pay | Source: Ambulatory Visit | Attending: Surgery | Admitting: Surgery

## 2019-04-10 DIAGNOSIS — E041 Nontoxic single thyroid nodule: Secondary | ICD-10-CM

## 2019-04-10 NOTE — Procedures (Signed)
PROCEDURE SUMMARY:  Using direct ultrasound guidance, 5 passes were made using 25 g needles into the nodule within the left lobe of the thyroid.   Ultrasound was used to confirm needle placements on all occasions.   EBL = trace  Specimens were sent to Pathology for analysis.  See procedure note under Imaging tab in Epic for full procedure details.  Lukus Binion S Lovena Kluck PA-C 04/10/2019 3:41 PM

## 2019-04-11 ENCOUNTER — Other Ambulatory Visit: Payer: Self-pay

## 2019-04-16 ENCOUNTER — Ambulatory Visit: Payer: Self-pay | Admitting: Surgery

## 2019-04-16 ENCOUNTER — Encounter (HOSPITAL_COMMUNITY): Payer: Self-pay | Admitting: Surgery

## 2019-04-16 DIAGNOSIS — E210 Primary hyperparathyroidism: Secondary | ICD-10-CM | POA: Diagnosis present

## 2019-04-16 DIAGNOSIS — E042 Nontoxic multinodular goiter: Secondary | ICD-10-CM | POA: Diagnosis present

## 2019-04-16 NOTE — H&P (Signed)
General Surgery [facility]  Katelyn Hardin DOB: 1971-11-14 Married / Language: English / Race: Black or African American Female   History of Present Illness  The patient is a 48 year old female who presents with primary hyperparathyroidism.  CHIEF COMPLAINT: primary hyperparathyroidism  Patient is referred by Dr. Elayne Snare for surgical evaluation and management of primary hyperparathyroidism. Patient had been noted by her primary care physician to have an elevated serum calcium level. At the time she was taking hydrochlorothiazide which was discontinued. However, her laboratory studies remained abnormal. Calcium level ranged from 10.8-11.9. Intact PTH level ranged from 91-94. 24-hour urine collection for calcium was in the upper range of normal at 239. Patient has noted fatigue. She does have a history of nephrolithiasis. She does complain of bone and joint discomfort. She denies any history of osteoporosis. Patient has had no prior head or neck surgery. There is a history of thyroid disease in the patient's sister. There is no other family history of endocrine neoplasms. Patient underwent nuclear medicine parathyroid scan in October 2019. This did not demonstrate a parathyroid adenoma. However, there was subtle uptake at the left superior position. Ultrasound was recommended. Patient then underwent a MRI scan of the neck yesterday. This demonstrated 2 potential candidates for parathyroid adenoma located at the right superior pole and the left inferior pole. This was not felt to be conclusive due to it being a single phase MRI scan and these could possibly represent lymph nodes. Patient presents today to discuss these results and to discuss a strategy for management.   Past Surgical History  No pertinent past surgical history   Diagnostic Studies History Colonoscopy  5-10 years ago Mammogram  1-3 years ago Pap Smear  1-5 years  ago  Allergies Acetaminophen  Aspirin *ANALGESICS - NonNarcotic*  Codeine/Codeine Derivatives  oxyCODONE HCl *ANALGESICS - OPIOID*  HYDROcodone-Acetaminophen *ANALGESICS - OPIOID*   Medication History Lisinopril (10MG  Tablet, Oral) Active. cloNIDine HCl (0.1MG  Tablet, Oral) Active. Antivert (25MG  Tablet, Oral as needed) Active. Flonase (50MCG/DOSE Inhaler, Nasal as needed) Active. ZyrTEC (10MG  Tablet, Oral as needed) Active. Robaxin (500MG  Tablet, Oral as needed) Active. Gabapentin (300MG  Capsule, Oral) Active. Medications Reconciled  Social History  Alcohol use  Moderate alcohol use. Caffeine use  Carbonated beverages. No drug use  Tobacco use  Never smoker.  Family History Breast Cancer  Sister. Cervical Cancer  Mother. Hypertension  Daughter, Mother, Sister. Migraine Headache  Daughter, Sister. Thyroid problems  Sister.  Pregnancy / Birth History Age at menarche  42 years. Age of menopause  57-50 Gravida  3 Irregular periods  Maternal age  28-20 Para  2  Other Problems Back Pain  High blood pressure  Kidney Stone   Review of Systems General Present- Fatigue, Night Sweats and Weight Gain. Not Present- Appetite Loss, Chills, Fever and Weight Loss. Skin Present- Dryness. Not Present- Change in Wart/Mole, Hives, Jaundice, New Lesions, Non-Healing Wounds, Rash and Ulcer. HEENT Present- Ringing in the Ears, Seasonal Allergies and Wears glasses/contact lenses. Not Present- Earache, Hearing Loss, Hoarseness, Nose Bleed, Oral Ulcers, Sinus Pain, Sore Throat, Visual Disturbances and Yellow Eyes. Respiratory Present- Snoring. Not Present- Bloody sputum, Chronic Cough, Difficulty Breathing and Wheezing. Breast Not Present- Breast Mass, Breast Pain, Nipple Discharge and Skin Changes. Cardiovascular Not Present- Chest Pain, Difficulty Breathing Lying Down, Leg Cramps, Palpitations, Rapid Heart Rate, Shortness of Breath and Swelling of  Extremities. Gastrointestinal Present- Abdominal Pain, Bloating, Constipation, Excessive gas and Nausea. Not Present- Bloody Stool, Change in  Bowel Habits, Chronic diarrhea, Difficulty Swallowing, Gets full quickly at meals, Hemorrhoids, Indigestion, Rectal Pain and Vomiting. Female Genitourinary Not Present- Frequency, Nocturia, Painful Urination, Pelvic Pain and Urgency. Musculoskeletal Present- Back Pain, Joint Pain and Joint Stiffness. Not Present- Muscle Pain, Muscle Weakness and Swelling of Extremities. Neurological Not Present- Decreased Memory, Fainting, Headaches, Numbness, Seizures, Tingling, Tremor, Trouble walking and Weakness. Psychiatric Not Present- Anxiety, Bipolar, Change in Sleep Pattern, Depression, Fearful and Frequent crying. Endocrine Present- Excessive Hunger, Hair Changes and Hot flashes. Not Present- Cold Intolerance, Heat Intolerance and New Diabetes. Hematology Not Present- Blood Thinners, Easy Bruising, Excessive bleeding, Gland problems, HIV and Persistent Infections.  Vitals Weight: 194 lb Height: 66in Body Surface Area: 1.97 m Body Mass Index: 31.31 kg/m  Temp.: 97.55F(Temporal)  Pulse: 94 (Regular)  BP: 128/74 (Sitting, Left Arm, Standard)   Physical Exam  See vital signs recorded above  GENERAL APPEARANCE Development: normal Nutritional status: normal Gross deformities: none  SKIN Rash, lesions, ulcers: none Induration, erythema: none Nodules: none palpable  EYES Conjunctiva and lids: normal Pupils: equal and reactive Iris: normal bilaterally  EARS, NOSE, MOUTH, THROAT External ears: no lesion or deformity External nose: no lesion or deformity Hearing: grossly normal Lips: no lesion or deformity Dentition: normal for age Oral mucosa: moist  NECK Symmetric: yes Trachea: midline Thyroid: no palpable nodules in the thyroid bed  CHEST Respiratory effort: normal Retraction or accessory muscle use: no Breath sounds: normal  bilaterally Rales, rhonchi, wheeze: none  CARDIOVASCULAR Auscultation: regular rhythm, normal rate Murmurs: none Pulses: carotid and radial pulse 2+ palpable Lower extremity edema: none Lower extremity varicosities: none  MUSCULOSKELETAL Station and gait: normal Digits and nails: no clubbing or cyanosis Muscle strength: grossly normal all extremities Range of motion: grossly normal all extremities Deformity: none  LYMPHATIC Cervical: none palpable Supraclavicular: none palpable  PSYCHIATRIC Oriented to person, place, and time: yes Mood and affect: normal for situation Judgment and insight: appropriate for situation    Assessment & Plan   PRIMARY HYPERPARATHYROIDISM (E21.0)  Pt Education - Pamphlet Given - The Parathyroid Surgery Book: discussed with patient and provided information.  Patient presents on referral from her endocrinologist for evaluation for surgical management of primary hyperparathyroidism. She is provided with written literature on parathyroid disease to review at home.  Patient has biochemical evidence of primary hyperparathyroidism. She has had 2 imaging studies which indicate the potential for one or more parathyroid adenomas. I would like to obtain an ultrasound examination of the neck to evaluate the thyroid and to hopefully narrow the field to 1 or 2 possible parathyroid adenomas. We will obtain this study prior to any surgical procedure.  The patient and I discussed surgical intervention. We discussed single gland exploration versus exploring all 4 parathyroid glands. We discussed the location of the surgical incisions. We discussed potential complications including recurrent laryngeal nerve injury and multi-gland disease. We discussed the hospital stay which might be required. We discussed her postoperative recovery and return to work and activities. The patient understands and wishes to proceed.  The risks and benefits of the procedure have  been discussed at length with the patient. The patient understands the proposed procedure, potential alternative treatments, and the course of recovery to be expected. All of the patient's questions have been answered at this time. The patient wishes to proceed with surgery.  ADDENDUM USN exam with multiple thyroid nodules noted.  FNA biopsy of suspicious nodule performed with benign cytopathology.  Will plan to proceed with neck exploration and parathyroidectomy (  possibly multiple gland disease).  Armandina Gemma, [facility] Office: [phone]

## 2019-05-01 NOTE — Patient Instructions (Addendum)
Katelyn Hardin    Your procedure is scheduled on: 05-09-2019  Report to [facility] Main  Entrance  Report to admitting at 900 AM   [place] [facility] TEST ON__6-15-20_____ @_300pm______ , THIS TEST MUST BE DONE BEFORE SURGERY, COME TO [place].     Call this number if you have problems the morning of surgery [phone]   Remember: Do not eat food or drink liquids :After Midnight. BRUSH YOUR TEETH MORNING OF SURGERY AND RINSE YOUR MOUTH OUT, NO CHEWING GUM CANDY OR MINTS.     Take these medicines the morning of surgery with A SIP OF WATER: clonidine (catapres)                                You may not have any metal on your body including hair pins and              piercings  Do not wear jewelry, make-up, lotions, powders or perfumes, deodorant             Do not wear nail polish.  Do not shave  48 hours prior to surgery.            Do not bring valuables to the hospital. [place].  Contacts, dentures or bridgework may not be worn into surgery.  Leave suitcase in the car. After surgery it may be brought to your room.     Patients discharged the day of surgery will not be allowed to drive home. IF YOU ARE HAVING SURGERY AND GOING HOME THE SAME DAY, YOU MUST HAVE AN ADULT TO DRIVE YOU HOME AND BE WITH YOU FOR 24 HOURS. YOU MAY GO HOME BY TAXI OR UBER OR ORTHERWISE, BUT AN ADULT MUST ACCOMPANY YOU HOME AND STAY WITH YOU FOR 24 HOURS.  Name and phone number of your driver:  Special Instructions: N/A              Please read over the following fact sheets you were given: _____________________________________________________________________             [facility] - Preparing for Surgery Before surgery, you can play an important role.  Because skin is not sterile, your skin needs to be as free of germs as possible.  You can reduce the number of germs on  your skin by washing with CHG (chlorahexidine gluconate) soap before surgery.  CHG is an antiseptic cleaner which kills germs and bonds with the skin to continue killing germs even after washing. Please DO NOT use if you have an allergy to CHG or antibacterial soaps.  If your skin becomes reddened/irritated stop using the CHG and inform your nurse when you arrive at Short Stay. Do not shave (including legs and underarms) for at least 48 hours prior to the first CHG shower.  You may shave your face/neck. Please follow these instructions carefully:  1.  Shower with CHG Soap the night before surgery and the  morning of Surgery.  2.  If you choose to wash your hair, wash your hair first as usual with your  normal  shampoo.  3.  After you shampoo, rinse your hair and body thoroughly to remove the  shampoo.                           4.  Use CHG as you would any other liquid soap.  You can apply chg directly  to the skin and wash                       Gently with a scrungie or clean washcloth.  5.  Apply the CHG Soap to your body ONLY FROM THE NECK DOWN.   Do not use on face/ open                           Wound or open sores. Avoid contact with eyes, ears mouth and genitals (private parts).                       Wash face,  Genitals (private parts) with your normal soap.             6.  Wash thoroughly, paying special attention to the area where your surgery  will be performed.  7.  Thoroughly rinse your body with warm water from the neck down.  8.  DO NOT shower/wash with your normal soap after using and rinsing off  the CHG Soap.                9.  Pat yourself dry with a clean towel.            10.  Wear clean pajamas.            11.  Place clean sheets on your bed the night of your first shower and do not  sleep with pets. Day of Surgery : Do not apply any lotions/deodorants the morning of surgery.  Please wear clean clothes to the hospital/surgery center.  FAILURE TO FOLLOW THESE INSTRUCTIONS MAY  RESULT IN THE CANCELLATION OF YOUR SURGERY PATIENT SIGNATURE_________________________________  NURSE SIGNATURE__________________________________  ________________________________________________________________________

## 2019-05-02 ENCOUNTER — Encounter (HOSPITAL_COMMUNITY): Payer: Self-pay

## 2019-05-02 ENCOUNTER — Encounter (HOSPITAL_COMMUNITY)
Admission: RE | Admit: 2019-05-02 | Discharge: 2019-05-02 | Disposition: A | Discharge status: Home / Self Care | Payer: Self-pay | Source: Ambulatory Visit | Attending: Surgery | Admitting: Surgery

## 2019-05-02 ENCOUNTER — Other Ambulatory Visit: Payer: Self-pay

## 2019-05-02 DIAGNOSIS — Z01818 Encounter for other preprocedural examination: Secondary | ICD-10-CM | POA: Insufficient documentation

## 2019-05-02 DIAGNOSIS — E210 Primary hyperparathyroidism: Secondary | ICD-10-CM | POA: Insufficient documentation

## 2019-05-02 HISTORY — DX: Other complications of anesthesia, initial encounter: T88.59XA

## 2019-05-02 HISTORY — DX: Personal history of other diseases of the digestive system: Z87.19

## 2019-05-02 HISTORY — DX: Personal history of urinary calculi: Z87.442

## 2019-05-02 LAB — BASIC METABOLIC PANEL
Anion gap: 8 (ref 5–15)
BUN: 17 mg/dL (ref 6–20)
CO2: 25 mmol/L (ref 22–32)
Calcium: 11.1 mg/dL — ABNORMAL HIGH (ref 8.9–10.3)
Chloride: 107 mmol/L (ref 98–111)
Creatinine, Ser: 0.79 mg/dL (ref 0.44–1.00)
GFR calc Af Amer: >60 mL/min (ref >60)
GFR calc non Af Amer: >60 mL/min (ref >60)
Glucose, Bld: 86 mg/dL (ref 70–99)
Potassium: 3.5 mmol/L (ref 3.5–5.1)
Sodium: 140 mmol/L (ref 135–145)

## 2019-05-02 LAB — CBC
HCT: 37.1 % (ref 36.0–46.0)
Hemoglobin: 11.9 g/dL — ABNORMAL LOW (ref 12.0–15.0)
MCH: 31.9 pg (ref 26.0–34.0)
MCHC: 32.1 g/dL (ref 30.0–36.0)
MCV: 99.5 fL (ref 80.0–100.0)
Platelets: 303 10*3/uL (ref 150–400)
RBC: 3.73 MIL/uL — ABNORMAL LOW (ref 3.87–5.11)
RDW: 13.2 % (ref 11.5–15.5)
WBC: 6.5 10*3/uL (ref 4.0–10.5)
nRBC: 0 % (ref 0.0–0.2)

## 2019-05-06 ENCOUNTER — Other Ambulatory Visit (HOSPITAL_COMMUNITY)
Admission: RE | Admit: 2019-05-06 | Discharge: 2019-05-06 | Disposition: A | Discharge status: Home / Self Care | Payer: HRSA Program | Source: Ambulatory Visit | Attending: Surgery | Admitting: Surgery

## 2019-05-06 DIAGNOSIS — Z1159 Encounter for screening for other viral diseases: Secondary | ICD-10-CM | POA: Diagnosis not present

## 2019-05-08 ENCOUNTER — Encounter (HOSPITAL_COMMUNITY): Payer: Self-pay | Admitting: Surgery

## 2019-05-08 LAB — NOVEL CORONAVIRUS, NAA (HOSP ORDER, SEND-OUT TO REF LAB; TAT 18-24 HRS): SARS-CoV-2, NAA: NOT DETECTED

## 2019-05-09 ENCOUNTER — Observation Stay (HOSPITAL_COMMUNITY)
Admission: RE | Admit: 2019-05-09 | Discharge: 2019-05-10 | Disposition: A | Discharge status: Home / Self Care | Payer: Self-pay | Attending: Surgery | Admitting: Surgery

## 2019-05-09 ENCOUNTER — Ambulatory Visit (HOSPITAL_COMMUNITY): Payer: Self-pay | Admitting: Certified Registered Nurse Anesthetist

## 2019-05-09 ENCOUNTER — Other Ambulatory Visit: Payer: Self-pay

## 2019-05-09 ENCOUNTER — Encounter (HOSPITAL_COMMUNITY): Payer: Self-pay | Admitting: Anesthesiology

## 2019-05-09 ENCOUNTER — Ambulatory Visit (HOSPITAL_COMMUNITY): Payer: Self-pay | Admitting: Physician Assistant

## 2019-05-09 ENCOUNTER — Encounter (HOSPITAL_COMMUNITY)
Admission: RE | Disposition: A | Discharge status: Home / Self Care | Payer: Self-pay | Source: Home / Self Care | Attending: Surgery

## 2019-05-09 DIAGNOSIS — E210 Primary hyperparathyroidism: Principal | ICD-10-CM | POA: Diagnosis present

## 2019-05-09 DIAGNOSIS — Z79899 Other long term (current) drug therapy: Secondary | ICD-10-CM | POA: Insufficient documentation

## 2019-05-09 DIAGNOSIS — G4730 Sleep apnea, unspecified: Secondary | ICD-10-CM | POA: Insufficient documentation

## 2019-05-09 DIAGNOSIS — I10 Essential (primary) hypertension: Secondary | ICD-10-CM | POA: Insufficient documentation

## 2019-05-09 DIAGNOSIS — F329 Major depressive disorder, single episode, unspecified: Secondary | ICD-10-CM | POA: Insufficient documentation

## 2019-05-09 DIAGNOSIS — E042 Nontoxic multinodular goiter: Secondary | ICD-10-CM | POA: Diagnosis present

## 2019-05-09 HISTORY — PX: PARATHYROIDECTOMY: SHX19

## 2019-05-09 SURGERY — PARATHYROIDECTOMY
Anesthesia: General

## 2019-05-09 MED ORDER — CEFAZOLIN SODIUM-DEXTROSE 2-4 GM/100ML-% IV SOLN
2.0000 g | Freq: Once | INTRAVENOUS | Status: AC
  Administered 2019-05-09: 2 g via INTRAVENOUS

## 2019-05-09 MED ORDER — HYDROMORPHONE HCL 1 MG/ML IJ SOLN
0.2500 mg | INTRAMUSCULAR | Status: DC | PRN
  Administered 2019-05-09: 0.5 mg via INTRAVENOUS
  Administered 2019-05-09 (×2): 0.25 mg via INTRAVENOUS

## 2019-05-09 MED ORDER — SCOPOLAMINE 1 MG/3DAYS TD PT72
MEDICATED_PATCH | TRANSDERMAL | Status: DC | PRN
  Administered 2019-05-09: 1 via TRANSDERMAL

## 2019-05-09 MED ORDER — PROPOFOL 10 MG/ML IV BOLUS
INTRAVENOUS | Status: AC
  Filled 2019-05-09: qty 20

## 2019-05-09 MED ORDER — ACETAMINOPHEN 650 MG RE SUPP: 650.0000 mg | Freq: Four times a day (QID) | RECTAL | Status: DC | PRN

## 2019-05-09 MED ORDER — LISINOPRIL-HYDROCHLOROTHIAZIDE 20-25 MG PO TABS: 1.0000 | ORAL_TABLET | Freq: Every day | ORAL | Status: DC

## 2019-05-09 MED ORDER — CEFAZOLIN SODIUM-DEXTROSE 2-4 GM/100ML-% IV SOLN
INTRAVENOUS | Status: AC
  Filled 2019-05-09: qty 100

## 2019-05-09 MED ORDER — ONDANSETRON HCL 4 MG/2ML IJ SOLN: 4.0000 mg | Freq: Four times a day (QID) | INTRAMUSCULAR | Status: DC | PRN

## 2019-05-09 MED ORDER — ROCURONIUM BROMIDE 50 MG/5ML IV SOSY
PREFILLED_SYRINGE | INTRAVENOUS | Status: DC | PRN
  Administered 2019-05-09: 50 mg via INTRAVENOUS
  Administered 2019-05-09: 5 mg via INTRAVENOUS

## 2019-05-09 MED ORDER — ACETAMINOPHEN 325 MG PO TABS: 650.0000 mg | ORAL_TABLET | Freq: Four times a day (QID) | ORAL | Status: DC | PRN

## 2019-05-09 MED ORDER — LACTATED RINGERS IV SOLN
INTRAVENOUS | Status: DC
  Administered 2019-05-09 (×2): via INTRAVENOUS

## 2019-05-09 MED ORDER — BUPIVACAINE-EPINEPHRINE (PF) 0.25% -1:200000 IJ SOLN
INTRAMUSCULAR | Status: AC
  Filled 2019-05-09: qty 30

## 2019-05-09 MED ORDER — MIDAZOLAM HCL 5 MG/5ML IJ SOLN
INTRAMUSCULAR | Status: DC | PRN
  Administered 2019-05-09: 2 mg via INTRAVENOUS

## 2019-05-09 MED ORDER — MIDAZOLAM HCL 2 MG/2ML IJ SOLN
INTRAMUSCULAR | Status: AC
  Filled 2019-05-09: qty 2

## 2019-05-09 MED ORDER — LISINOPRIL 20 MG PO TABS
20.0000 mg | ORAL_TABLET | Freq: Every day | ORAL | Status: DC
  Administered 2019-05-09 – 2019-05-10 (×2): 20 mg via ORAL
  Filled 2019-05-09 (×2): qty 1

## 2019-05-09 MED ORDER — ONDANSETRON HCL 4 MG/2ML IJ SOLN
INTRAMUSCULAR | Status: AC
  Filled 2019-05-09: qty 2

## 2019-05-09 MED ORDER — SUCCINYLCHOLINE CHLORIDE 200 MG/10ML IV SOSY
PREFILLED_SYRINGE | INTRAVENOUS | Status: DC | PRN
  Administered 2019-05-09: 120 mg via INTRAVENOUS

## 2019-05-09 MED ORDER — FENTANYL CITRATE (PF) 100 MCG/2ML IJ SOLN
INTRAMUSCULAR | Status: DC | PRN
  Administered 2019-05-09 (×3): 50 ug via INTRAVENOUS
  Administered 2019-05-09: 100 ug via INTRAVENOUS

## 2019-05-09 MED ORDER — HYDROCHLOROTHIAZIDE 25 MG PO TABS
25.0000 mg | ORAL_TABLET | Freq: Every day | ORAL | Status: DC
  Administered 2019-05-09 – 2019-05-10 (×2): 25 mg via ORAL
  Filled 2019-05-09 (×2): qty 1

## 2019-05-09 MED ORDER — LIDOCAINE 2% (20 MG/ML) 5 ML SYRINGE
INTRAMUSCULAR | Status: AC
  Filled 2019-05-09: qty 5

## 2019-05-09 MED ORDER — 0.9 % SODIUM CHLORIDE (POUR BTL) OPTIME
TOPICAL | Status: DC | PRN
  Administered 2019-05-09: 12:00:00 1000 mL

## 2019-05-09 MED ORDER — LIDOCAINE 2% (20 MG/ML) 5 ML SYRINGE
INTRAMUSCULAR | Status: DC | PRN
  Administered 2019-05-09: 60 mg via INTRAVENOUS

## 2019-05-09 MED ORDER — TRAMADOL HCL 50 MG PO TABS
50.0000 mg | ORAL_TABLET | Freq: Four times a day (QID) | ORAL | Status: DC | PRN
  Administered 2019-05-09 – 2019-05-10 (×3): 50 mg via ORAL
  Filled 2019-05-09 (×3): qty 1

## 2019-05-09 MED ORDER — PROMETHAZINE HCL 25 MG/ML IJ SOLN
6.2500 mg | INTRAMUSCULAR | Status: DC | PRN
  Administered 2019-05-09: 6.25 mg via INTRAVENOUS

## 2019-05-09 MED ORDER — MEPERIDINE HCL 50 MG/ML IJ SOLN: 6.2500 mg | INTRAMUSCULAR | Status: DC | PRN

## 2019-05-09 MED ORDER — FENTANYL CITRATE (PF) 250 MCG/5ML IJ SOLN
INTRAMUSCULAR | Status: AC
  Filled 2019-05-09: qty 5

## 2019-05-09 MED ORDER — DEXAMETHASONE SODIUM PHOSPHATE 10 MG/ML IJ SOLN
INTRAMUSCULAR | Status: DC | PRN
  Administered 2019-05-09: 10 mg via INTRAVENOUS

## 2019-05-09 MED ORDER — ESMOLOL HCL 100 MG/10ML IV SOLN
INTRAVENOUS | Status: DC | PRN
  Administered 2019-05-09: 20 mg via INTRAVENOUS

## 2019-05-09 MED ORDER — HYDROCODONE-ACETAMINOPHEN 5-325 MG PO TABS: 1.0000 | ORAL_TABLET | ORAL | Status: DC | PRN

## 2019-05-09 MED ORDER — HYDROMORPHONE HCL 1 MG/ML IJ SOLN
1.0000 mg | INTRAMUSCULAR | Status: DC | PRN
  Administered 2019-05-09: 1 mg via INTRAVENOUS
  Filled 2019-05-09: qty 1

## 2019-05-09 MED ORDER — ONDANSETRON 4 MG PO TBDP: 4.0000 mg | ORAL_TABLET | Freq: Four times a day (QID) | ORAL | Status: DC | PRN

## 2019-05-09 MED ORDER — HYDROMORPHONE HCL 1 MG/ML IJ SOLN
INTRAMUSCULAR | Status: AC
  Administered 2019-05-09: 0.25 mg via INTRAVENOUS
  Filled 2019-05-09: qty 1

## 2019-05-09 MED ORDER — CLONIDINE HCL 0.1 MG PO TABS
0.1000 mg | ORAL_TABLET | Freq: Every day | ORAL | Status: DC
  Administered 2019-05-09: 0.1 mg via ORAL
  Filled 2019-05-09: qty 1

## 2019-05-09 MED ORDER — SCOPOLAMINE 1 MG/3DAYS TD PT72
MEDICATED_PATCH | TRANSDERMAL | Status: AC
  Filled 2019-05-09: qty 1

## 2019-05-09 MED ORDER — PROPOFOL 10 MG/ML IV BOLUS
INTRAVENOUS | Status: DC | PRN
  Administered 2019-05-09: 150 mg via INTRAVENOUS
  Administered 2019-05-09: 50 mg via INTRAVENOUS

## 2019-05-09 MED ORDER — ROCURONIUM BROMIDE 10 MG/ML (PF) SYRINGE
PREFILLED_SYRINGE | INTRAVENOUS | Status: AC
  Filled 2019-05-09: qty 10

## 2019-05-09 MED ORDER — SUGAMMADEX SODIUM 200 MG/2ML IV SOLN
INTRAVENOUS | Status: DC | PRN
  Administered 2019-05-09: 200 mg via INTRAVENOUS

## 2019-05-09 MED ORDER — PROMETHAZINE HCL 25 MG/ML IJ SOLN
INTRAMUSCULAR | Status: AC
  Filled 2019-05-09: qty 1

## 2019-05-09 MED ORDER — ACETAMINOPHEN 325 MG PO TABS: 325.0000 mg | ORAL_TABLET | Freq: Once | ORAL | Status: DC | PRN

## 2019-05-09 MED ORDER — ACETAMINOPHEN 160 MG/5ML PO SOLN: 325.0000 mg | Freq: Once | ORAL | Status: DC | PRN

## 2019-05-09 MED ORDER — LACTATED RINGERS IV SOLN: INTRAVENOUS | Status: DC

## 2019-05-09 MED ORDER — ONDANSETRON HCL 4 MG/2ML IJ SOLN
INTRAMUSCULAR | Status: DC | PRN
  Administered 2019-05-09: 4 mg via INTRAVENOUS

## 2019-05-09 MED ORDER — ACETAMINOPHEN 10 MG/ML IV SOLN: 1000.0000 mg | Freq: Once | INTRAVENOUS | Status: DC | PRN

## 2019-05-09 MED ORDER — DEXAMETHASONE SODIUM PHOSPHATE 10 MG/ML IJ SOLN
INTRAMUSCULAR | Status: AC
  Filled 2019-05-09: qty 1

## 2019-05-09 MED ORDER — BUPIVACAINE HCL 0.25 % IJ SOLN
INTRAMUSCULAR | Status: DC | PRN
  Administered 2019-05-09: 10 mL

## 2019-05-09 MED ORDER — SUCCINYLCHOLINE CHLORIDE 200 MG/10ML IV SOSY
PREFILLED_SYRINGE | INTRAVENOUS | Status: AC
  Filled 2019-05-09: qty 10

## 2019-05-09 MED ORDER — GABAPENTIN 300 MG PO CAPS
900.0000 mg | ORAL_CAPSULE | Freq: Every day | ORAL | Status: DC
  Administered 2019-05-09: 900 mg via ORAL
  Filled 2019-05-09: qty 3

## 2019-05-09 MED ORDER — KCL IN DEXTROSE-NACL 20-5-0.45 MEQ/L-%-% IV SOLN
INTRAVENOUS | Status: DC
  Administered 2019-05-09: 15:00:00 via INTRAVENOUS
  Filled 2019-05-09: qty 1000

## 2019-05-09 SURGICAL SUPPLY — 33 items
ADH SKN CLS APL DERMABOND .7 (GAUZE/BANDAGES/DRESSINGS) ×1
APL PRP STRL LF DISP 70% ISPRP (MISCELLANEOUS) ×1
ATTRACTOMAT 16X20 MAGNETIC DRP (DRAPES) ×3 IMPLANT
BLADE SURG 15 STRL LF DISP TIS (BLADE) ×1 IMPLANT
BLADE SURG 15 STRL SS (BLADE) ×3
CHLORAPREP W/TINT 26 (MISCELLANEOUS) ×3 IMPLANT
CLIP VESOCCLUDE MED 6/CT (CLIP) ×6 IMPLANT
CLIP VESOCCLUDE SM WIDE 6/CT (CLIP) ×6 IMPLANT
COVER SURGICAL LIGHT HANDLE (MISCELLANEOUS) ×3 IMPLANT
COVER WAND RF STERILE (DRAPES) ×3 IMPLANT
DERMABOND ADVANCED (GAUZE/BANDAGES/DRESSINGS) ×2
DERMABOND ADVANCED .7 DNX12 (GAUZE/BANDAGES/DRESSINGS) IMPLANT
DRAPE LAPAROTOMY T 98X78 PEDS (DRAPES) ×3 IMPLANT
ELECT PENCIL ROCKER SW 15FT (MISCELLANEOUS) ×3 IMPLANT
ELECT REM PT RETURN 15FT ADLT (MISCELLANEOUS) ×3 IMPLANT
GAUZE 4X4 16PLY RFD (DISPOSABLE) ×3 IMPLANT
GLOVE SURG ORTHO 8.0 STRL STRW (GLOVE) ×3 IMPLANT
GOWN STRL REUS W/TWL XL LVL3 (GOWN DISPOSABLE) ×9 IMPLANT
HEMOSTAT SURGICEL 2X4 FIBR (HEMOSTASIS) IMPLANT
ILLUMINATOR WAVEGUIDE N/F (MISCELLANEOUS) IMPLANT
KIT BASIN OR (CUSTOM PROCEDURE TRAY) ×3 IMPLANT
KIT TURNOVER KIT A (KITS) IMPLANT
NDL HYPO 25X1 1.5 SAFETY (NEEDLE) ×1 IMPLANT
NEEDLE HYPO 25X1 1.5 SAFETY (NEEDLE) ×3 IMPLANT
PACK BASIC VI WITH GOWN DISP (CUSTOM PROCEDURE TRAY) ×3 IMPLANT
SUT MNCRL AB 4-0 PS2 18 (SUTURE) ×3 IMPLANT
SUT VIC AB 3-0 SH 18 (SUTURE) ×3 IMPLANT
SYR BULB IRRIGATION 50ML (SYRINGE) ×3 IMPLANT
SYR CONTROL 10ML LL (SYRINGE) ×3 IMPLANT
TOWEL OR 17X26 10 PK STRL BLUE (TOWEL DISPOSABLE) ×3 IMPLANT
TOWEL OR NON WOVEN STRL DISP B (DISPOSABLE) ×3 IMPLANT
TUBING CONNECTING 10 (TUBING) ×2 IMPLANT
TUBING CONNECTING 10' (TUBING) ×1

## 2019-05-09 NOTE — Anesthesia Procedure Notes (Signed)
Procedure Name: Intubation Date/Time: 05/09/2019 10:49 AM Performed by: Maxwell Caul, CRNA Pre-anesthesia Checklist: Patient identified, Emergency Drugs available, Suction available and Patient being monitored Patient Re-evaluated:Patient Re-evaluated prior to induction Oxygen Delivery Method: Circle system utilized Preoxygenation: Pre-oxygenation with 100% oxygen Induction Type: IV induction Ventilation: Mask ventilation without difficulty Laryngoscope Size: Glidescope and 4 Grade View: Grade I Tube type: Oral Tube size: 7.5 mm Number of attempts: 2 Airway Equipment and Method: Stylet Placement Confirmation: ETT inserted through vocal cords under direct vision,  positive ETCO2 and breath sounds checked- equal and bilateral Secured at: 21 cm Tube secured with: Tape Dental Injury: Teeth and Oropharynx as per pre-operative assessment  Difficulty Due To: Difficulty was unanticipated, Difficult Airway- due to limited oral opening and Difficult Airway- due to anterior larynx Future Recommendations: Recommend- induction with short-acting agent, and alternative techniques readily available Comments: DL with MAC 4 and Grade II-III view, unable to place ETT. Glidescope 4 utilized and ETT 7.5 placed.

## 2019-05-09 NOTE — Interval H&P Note (Signed)
History and Physical Interval Note:  05/09/2019 10:18 AM  Katelyn Hardin  has presented today for surgery, with the diagnosis of primary hyperparathyroidism.  The various methods of treatment have been discussed with the patient and family. After consideration of risks, benefits and other options for treatment, the patient has consented to    Procedure(s): PARATHYROIDECTOMY (N/A) as a surgical intervention.    The patient's history has been reviewed, patient examined, no change in status, stable for surgery.  I have reviewed the patient's chart and labs.  Questions were answered to the patient's satisfaction.    Armandina Gemma, [place] [facility] Office: [place]

## 2019-05-09 NOTE — Transfer of Care (Signed)
Immediate Anesthesia Transfer of Care Note  Patient: Katelyn Hardin  Procedure(s) Performed: PARATHYROIDECTOMY (N/A )  Patient Location: PACU  Anesthesia Type:General  Level of Consciousness: awake, alert  and oriented  Airway & Oxygen Therapy: Patient Spontanous Breathing and Patient connected to face mask oxygen  Post-op Assessment: Report given to RN and Post -op Vital signs reviewed and stable  Post vital signs: Reviewed and stable  Last Vitals:  Vitals Value Taken Time  BP 156/98 05/09/19 1224  Temp    Pulse 88 05/09/19 1227  Resp 19 05/09/19 1227  SpO2 95 % 05/09/19 1227  Vitals shown include unvalidated device data.  Last Pain:  Vitals:   05/09/19 0938  TempSrc:   PainSc: 0-No pain         Complications: No apparent anesthesia complications

## 2019-05-09 NOTE — Anesthesia Postprocedure Evaluation (Signed)
Anesthesia Post Note  Patient: Katelyn Hardin  Procedure(s) Performed: PARATHYROIDECTOMY (N/A )     Patient location during evaluation: PACU Anesthesia Type: General Level of consciousness: awake and alert Pain management: pain level controlled Vital Signs Assessment: post-procedure vital signs reviewed and stable Respiratory status: spontaneous breathing, nonlabored ventilation, respiratory function stable and patient connected to nasal cannula oxygen Cardiovascular status: blood pressure returned to baseline and stable Postop Assessment: no apparent nausea or vomiting Anesthetic complications: no    Last Vitals:  Vitals:   05/09/19 1315 05/09/19 1330  BP: (!) 137/98 (!) 154/84  Pulse: 71 68  Resp: 15 16  Temp:  36.7 C  SpO2: 100% 100%    Last Pain:  Vitals:   05/09/19 1330  TempSrc: Oral  PainSc:                  Effie Berkshire

## 2019-05-09 NOTE — Op Note (Signed)
Operative Note  Pre-operative Diagnosis:  Primary hyperparathyroidism  Post-operative Diagnosis:  same  Surgeon:  Armandina Gemma, MD  Assistant:  none   Procedure:  Neck exploration with right superior parathyroidectomy  Anesthesia:  general  Estimated Blood Loss:  minimal  Drains: none         Specimen: right superior parathyroid to pathology  Indications:  Patient is referred by Dr. Elayne Snare for surgical evaluation and management of primary hyperparathyroidism. Patient had been noted by her primary care physician to have an elevated serum calcium level. At the time she was taking hydrochlorothiazide which was discontinued. However, her laboratory studies remained abnormal. Calcium level ranged from 10.8-11.9. Intact PTH level ranged from 91-94. 24-hour urine collection for calcium was in the upper range of normal at 239. Patient has noted fatigue. She does have a history of nephrolithiasis. She does complain of bone and joint discomfort. She denies any history of osteoporosis. Patient has had no prior head or neck surgery. There is a history of thyroid disease in the patient's sister. There is no other family history of endocrine neoplasms. Patient underwent nuclear medicine parathyroid scan in October 2019. This did not demonstrate a parathyroid adenoma. However, there was subtle uptake at the left superior position. Ultrasound was recommended. Patient then underwent a MRI scan of the neck yesterday. This demonstrated 2 potential candidates for parathyroid adenoma located at the right superior pole and the left inferior pole. This was not felt to be conclusive due to it being a single phase MRI scan and these could possibly represent lymph nodes. Patient presents today to discuss these results and to discuss a strategy for management.  Procedure Details:  The patient was seen in the pre-op holding area. The risks, benefits, complications, treatment options, and expected  outcomes were previously discussed with the patient. The patient agreed with the proposed plan and has signed the informed consent form.  The patient was brought to the operating room by the surgical team, identified as [name] and the procedure verified. A "time out" was completed and the above information confirmed.  Following induction of general endotracheal anesthesia, the patient was positioned and then prepped and draped in the usual aseptic fashion.  After ascertaining that an adequate level of anesthesia been achieved, a small Kocher incision was made in the anterior neck.  Dissection was carried through subcutaneous tissues and platysma.  Hemostasis was achieved with the electrocautery.  Skin flaps were developed cephalad and caudad.  Wheat Lander retractors were placed for exposure.  Strap muscles were incised in the midline.  Dissection was begun on the left side.  Left thyroid lobe was mildly enlarged.  Exploration at the inferior pole failed to reveal any evidence of parathyroid tissue.  Dissection at the superior pole revealed a normal superior parathyroid gland on the lateral and posterior aspect of the superior pole of the thyroid.  Next we explored the right thyroid lobe.  It was fully mobilized.  There was a normal-appearing inferior parathyroid on the surface of the inferior thyroid pole.  This was left in situ.  Mobilization of the right superior pole revealed an enlarged parathyroid gland lateral and posterior to the superior pole vasculature.  This was dissected out.  Vascular pedicle was divided between small ligaclips and the gland was excised.  This was submitted to pathology for frozen section.  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  I returned to the left inferior position for further exploration.  There  was no evidence of enlarged parathyroid tissue.  There was a approximately a 1 cm lymph node just inferior  to the inferior pole of the left lobe of the thyroid.  This appeared benign.  It was left in situ.  Neck was irrigated with warm saline and good hemostasis was noted.  Fibrillar was placed throughout the operative field.  Strap muscles were reapproximated in the midline with interrupted 3-0 Vicryl sutures.  Platysma was closed with interrupted 3-0 Vicryl sutures.  Skin was anesthetized with local anesthetic.  Skin edges were reapproximated with a running 4-0 Monocryl subcuticular suture.  Wound was washed and dried and Dermabond was applied as dressing.  Patient was awakened from anesthesia and brought to the recovery room.  The patient tolerated the procedure well.   Armandina Gemma, MD [facility] Office: [phone]

## 2019-05-09 NOTE — Anesthesia Preprocedure Evaluation (Addendum)
Anesthesia Evaluation  Patient identified by MRN, date of birth, ID band Patient awake    Reviewed: Allergy & Precautions, NPO status , Patient's Chart, lab work & pertinent test results  Airway Mallampati: III  TM Distance: >3 FB Neck ROM: Full    Dental  (+) Dental Advisory Given, Partial Upper   Pulmonary sleep apnea ,    breath sounds clear to auscultation       Cardiovascular hypertension, Pt. on medications  Rhythm:Regular Rate:Normal     Neuro/Psych Depression  Neuromuscular disease    GI/Hepatic Neg liver ROS, hiatal hernia,   Endo/Other  negative endocrine ROS  Renal/GU negative Renal ROS     Musculoskeletal negative musculoskeletal ROS (+)   Abdominal Normal abdominal exam  (+)   Peds  Hematology   Anesthesia Other Findings   Reproductive/Obstetrics                           Lab Results  Component Value Date   WBC 6.5 05/02/2019   HGB 11.9 (L) 05/02/2019   HCT 37.1 05/02/2019   MCV 99.5 05/02/2019   PLT 303 05/02/2019   Lab Results  Component Value Date   CREATININE 0.79 05/02/2019   BUN 17 05/02/2019   NA 140 05/02/2019   K 3.5 05/02/2019   CL 107 05/02/2019   CO2 25 05/02/2019   No results found for: INR, PROTIME  EKG: normal sinus rhythm.  Anesthesia Physical Anesthesia Plan  ASA: II  Anesthesia Plan: General   Post-op Pain Management:    Induction: Intravenous  PONV Risk Score and Plan: 4 or greater and Ondansetron, Dexamethasone, Midazolam and Scopolamine patch - Pre-op  Airway Management Planned: Oral ETT  Additional Equipment: None  Intra-op Plan:   Post-operative Plan: Extubation in OR  Informed Consent: I have reviewed the patients History and Physical, chart, labs and discussed the procedure including the risks, benefits and alternatives for the proposed anesthesia with the patient or authorized representative who has indicated his/her  understanding and acceptance.     Dental advisory given  Plan Discussed with: CRNA  Anesthesia Plan Comments:        Anesthesia Quick Evaluation

## 2019-05-10 ENCOUNTER — Encounter (HOSPITAL_COMMUNITY): Payer: Self-pay | Admitting: Surgery

## 2019-05-10 LAB — BASIC METABOLIC PANEL
Anion gap: 6 (ref 5–15)
BUN: 14 mg/dL (ref 6–20)
CO2: 25 mmol/L (ref 22–32)
Calcium: 9.8 mg/dL (ref 8.9–10.3)
Chloride: 110 mmol/L (ref 98–111)
Creatinine, Ser: 0.74 mg/dL (ref 0.44–1.00)
GFR calc Af Amer: >60 mL/min (ref >60)
GFR calc non Af Amer: >60 mL/min (ref >60)
Glucose, Bld: 124 mg/dL — ABNORMAL HIGH (ref 70–99)
Potassium: 3.7 mmol/L (ref 3.5–5.1)
Sodium: 141 mmol/L (ref 135–145)

## 2019-05-10 MED ORDER — TRAMADOL HCL 50 MG PO TABS: 50.0000 mg | ORAL_TABLET | Freq: Four times a day (QID) | ORAL | 0 refills | Status: DC | PRN

## 2019-05-10 NOTE — Discharge Summary (Signed)
Physician Discharge Summary [facility]  Patient ID: Katelyn Hardin MRN: [id number] DOB/AGE: [birth year] 49 y.o.  Admit date: 05/09/2019 Discharge date: 05/10/2019  Admission Diagnoses:  Primary hyperparathyroidism  Discharge Diagnoses:  Principal Problem:   Hyperparathyroidism, primary ([place]) Active Problems:   Multiple thyroid nodules   Primary hyperparathyroidism [facility])   Discharged Condition: good  Hospital Course: Patient was admitted for observation following parathyroid surgery.  Post op course was uncomplicated.  Pain was well controlled.  Tolerated diet.  Post op calcium level on morning following surgery was 9.8 mg/dl.  Patient was prepared for discharge home on POD#1.  Consults: None  Treatments: surgery: neck exploration with parathyroidectomy  Discharge Exam: Blood pressure 106/75, pulse 64, temperature 98.1 F (36.7 C), temperature source Oral, resp. rate 17, height 5\' 6"  (1.676 m), weight 89.5 kg, last menstrual period 11/19/2016, SpO2 97 %. HEENT - clear Neck - wound dry and intact with Dermabond, mild STS; voice normal Chest - clear bilaterally Cor - RRR   Disposition: Home  Discharge Instructions    Diet - low sodium heart healthy   Complete by: As directed    Discharge instructions   Complete by: As directed    [place]  THYROID & PARATHYROID SURGERY:  POST-OP INSTRUCTIONS  Always review your discharge instruction sheet from the facility where your surgery was performed.  A prescription for pain medication may be given to you upon discharge.  Take your pain medication as prescribed.  If narcotic pain medicine is not needed, then you may take acetaminophen (Tylenol) or ibuprofen (Advil) as needed.  Take your usually prescribed medications unless otherwise directed.  If you need a refill on your pain medication, please contact our office during regular business hours.  Prescriptions cannot be  processed by our office after 5 pm or on weekends.  Start with a light diet upon arrival home, such as soup and crackers or toast.  Be sure to drink plenty of fluids daily.  Resume your normal diet the day after surgery.  Most patients will experience some swelling and bruising on the chest and neck area.  Ice packs will help.  Swelling and bruising can take several days to resolve.   It is common to experience some constipation after surgery.  Increasing fluid intake and taking a stool softener (Colace) will usually help or prevent this problem.  A mild laxative (Milk of Magnesia or Miralax) should be taken according to package directions if there has been no bowel movement after 48 hours.  You have steri-strips and a gauze dressing over your incision.  You may remove the gauze bandage on the second day after surgery, and you may shower at that time.  Leave your steri-strips (small skin tapes) in place directly over the incision.  These strips should remain on the skin for 5-7 days and then be removed.  You may get them wet in the shower and pat them dry.  You may resume regular (light) daily activities beginning the next day (such as daily self-care, walking, climbing stairs) gradually increasing activities as tolerated.  You may have sexual intercourse when it is comfortable.  Refrain from any heavy lifting or straining until approved by your doctor.  You may drive when you no longer are taking prescription pain medication, you can comfortably wear a seatbelt, and you can safely maneuver your car and apply brakes.  You should see your doctor in the office for a follow-up appointment approximately three  weeks after your surgery.  Make sure that you call for this appointment within a day or two after you arrive home to insure a convenient appointment time.  WHEN TO CALL YOUR DOCTOR: -- Fever greater than 101.5 -- Inability to urinate -- Nausea and/or vomiting - persistent -- Extreme swelling or  bruising -- Continued bleeding from incision -- Increased pain, redness, or drainage from the incision -- Difficulty swallowing or breathing -- Muscle cramping or spasms -- Numbness or tingling in hands or around lips  The clinic staff is available to answer your questions during regular business hours.  Please don't hesitate to call and ask to speak to one of the nurses if you have concerns.  Armandina Gemma, MD [facility] Office: [phone]   Increase activity slowly   Complete by: As directed    No dressing needed   Complete by: As directed      Allergies as of 05/10/2019      Reactions   Acetaminophen Other (See Comments)   Jittery and upset stomach   Aspirin Nausea And Vomiting   Codeine    Jittery and upset stomach   Oxycodone-acetaminophen    Jittery and upset stomach   Vicodin [hydrocodone-acetaminophen]    Jittery and upset stomach      Medication List    TAKE these medications   cetirizine 10 MG tablet Commonly known as: ZYRTEC Take 1 tablet (10 mg total) by mouth daily. What changed:   when to take this  reasons to take this   cloNIDine 0.1 MG tablet Commonly known as: CATAPRES Take 1 tablet (0.1 mg total) by mouth daily. For hot flashes   clotrimazole-betamethasone cream Commonly known as: LOTRISONE Apply 1 application topically 2 (two) times daily. What changed:   when to take this  reasons to take this   fluticasone 50 MCG/ACT nasal spray Commonly known as: Flonase Place 1 spray into both nostrils daily. What changed:   when to take this  reasons to take this   gabapentin 300 MG capsule Commonly known as: NEURONTIN Take 1 capsule (300 mg total) by mouth 3 (three) times daily. What changed:   how much to take  when to take this   lisinopril-hydrochlorothiazide 20-25 MG tablet Commonly known as: ZESTORETIC Take 1 tablet by mouth daily.   meclizine 25 MG tablet Commonly known as: ANTIVERT Take 1 tablet (25 mg  total) by mouth 3 (three) times daily as needed for dizziness.   methocarbamol 500 MG tablet Commonly known as: ROBAXIN Take 1 tablet (500 mg total) 3 (three) times daily by mouth. X 10 days then prn What changed:   when to take this  reasons to take this  additional instructions   multivitamin with minerals Tabs tablet Take 1 tablet by mouth daily.   PROBIOTIC PO Take 1 capsule by mouth daily.   traMADol 50 MG tablet Commonly known as: ULTRAM Take 1-2 tablets (50-100 mg total) by mouth every 6 (six) hours as needed.        Earnstine Regal, MD, [facility] Office: [phone]   Signed: Armandina Gemma 05/10/2019, 8:49 AM

## 2019-05-10 NOTE — Progress Notes (Signed)
Discharge and medication instructions reviewed with patient. Questions answered and patient denies further questions. Spouse will come to drive patient home. No prescriptions given. Donne Hazel, RN

## 2019-05-12 ENCOUNTER — Other Ambulatory Visit: Payer: Self-pay

## 2019-05-12 ENCOUNTER — Emergency Department (HOSPITAL_COMMUNITY)
Admission: EM | Admit: 2019-05-12 | Discharge: 2019-05-13 | Disposition: A | Discharge status: Home / Self Care | Payer: Self-pay | Attending: Emergency Medicine | Admitting: Emergency Medicine

## 2019-05-12 ENCOUNTER — Emergency Department (HOSPITAL_COMMUNITY)
Admission: EM | Admit: 2019-05-12 | Discharge: 2019-05-12 | Disposition: A | Discharge status: Home / Self Care | Payer: Self-pay | Attending: Emergency Medicine | Admitting: Emergency Medicine

## 2019-05-12 ENCOUNTER — Encounter (HOSPITAL_COMMUNITY): Payer: Self-pay | Admitting: Emergency Medicine

## 2019-05-12 DIAGNOSIS — I10 Essential (primary) hypertension: Secondary | ICD-10-CM | POA: Insufficient documentation

## 2019-05-12 DIAGNOSIS — R200 Anesthesia of skin: Secondary | ICD-10-CM | POA: Insufficient documentation

## 2019-05-12 DIAGNOSIS — Z79899 Other long term (current) drug therapy: Secondary | ICD-10-CM | POA: Insufficient documentation

## 2019-05-12 DIAGNOSIS — R202 Paresthesia of skin: Secondary | ICD-10-CM | POA: Insufficient documentation

## 2019-05-12 DIAGNOSIS — Z5321 Procedure and treatment not carried out due to patient leaving prior to being seen by health care provider: Secondary | ICD-10-CM | POA: Insufficient documentation

## 2019-05-12 DIAGNOSIS — R42 Dizziness and giddiness: Secondary | ICD-10-CM | POA: Insufficient documentation

## 2019-05-12 LAB — BASIC METABOLIC PANEL
Anion gap: 9 (ref 5–15)
BUN: 14 mg/dL (ref 6–20)
CO2: 27 mmol/L (ref 22–32)
Calcium: 7.8 mg/dL — ABNORMAL LOW (ref 8.9–10.3)
Chloride: 107 mmol/L (ref 98–111)
Creatinine, Ser: 0.9 mg/dL (ref 0.44–1.00)
GFR calc Af Amer: >60 mL/min (ref >60)
GFR calc non Af Amer: >60 mL/min (ref >60)
Glucose, Bld: 112 mg/dL — ABNORMAL HIGH (ref 70–99)
Potassium: 2.9 mmol/L — ABNORMAL LOW (ref 3.5–5.1)
Sodium: 143 mmol/L (ref 135–145)

## 2019-05-12 LAB — CBC
HCT: 33.8 % — ABNORMAL LOW (ref 36.0–46.0)
Hemoglobin: 10.9 g/dL — ABNORMAL LOW (ref 12.0–15.0)
MCH: 31.7 pg (ref 26.0–34.0)
MCHC: 32.2 g/dL (ref 30.0–36.0)
MCV: 98.3 fL (ref 80.0–100.0)
Platelets: 279 10*3/uL (ref 150–400)
RBC: 3.44 MIL/uL — ABNORMAL LOW (ref 3.87–5.11)
RDW: 13.2 % (ref 11.5–15.5)
WBC: 8.2 10*3/uL (ref 4.0–10.5)
nRBC: 0 % (ref 0.0–0.2)

## 2019-05-12 MED ORDER — ONDANSETRON HCL 4 MG/2ML IJ SOLN
INTRAMUSCULAR | Status: AC
  Administered 2019-05-12: 4 mg
  Filled 2019-05-12: qty 2

## 2019-05-12 MED ORDER — SODIUM CHLORIDE 0.9 % IV BOLUS
1000.0000 mL | Freq: Once | INTRAVENOUS | Status: AC
  Administered 2019-05-12: 1000 mL via INTRAVENOUS

## 2019-05-12 MED ORDER — POTASSIUM CHLORIDE 10 MEQ/100ML IV SOLN
10.0000 meq | Freq: Once | INTRAVENOUS | Status: AC
  Administered 2019-05-12: 10 meq via INTRAVENOUS
  Filled 2019-05-12: qty 100

## 2019-05-12 MED ORDER — POTASSIUM CHLORIDE CRYS ER 20 MEQ PO TBCR
20.0000 meq | EXTENDED_RELEASE_TABLET | Freq: Once | ORAL | Status: AC
  Administered 2019-05-12: 20 meq via ORAL
  Filled 2019-05-12: qty 1

## 2019-05-12 NOTE — ED Provider Notes (Signed)
[facility] DEPT Provider Note   CSN: [id number] Arrival date & time: 05/12/19  2056     History   Chief Complaint No chief complaint on file.   HPI Katelyn Hardin is a 48 y.o. female.     Patient to ED with complaint of 24 hours of generalized lightheadedness and weakness, nausea without vomiting. No fever, pain, diarrhea. She was discharged home 2 days ago after hospitalization for parathyroidectomy that was uncomplicated, discharged on POD 1. She denies current sore throat, difficulty swallowing, SOB, cough or congestion. She has been eating and drinking without difficulty. She reports any pain continues to be controlled with ibuprofen. No urinary symptoms.   The history is provided by the patient. No language interpreter was used.    Past Medical History:  Diagnosis Date  . Anemia   . Bacterial vaginosis   . Complication of anesthesia    Slow to wake up  . Fibroids   . History of hiatal hernia   . History of kidney stones   . Hypertension   . IBS (irritable bowel syndrome)     Patient Active Problem List   Diagnosis Date Noted  . Primary hyperparathyroidism ([place]) 05/09/2019  . Hyperparathyroidism, primary ([place]) 04/16/2019  . Multiple thyroid nodules 04/16/2019  . Acute cystitis without hematuria 05/31/2017  . Acute bilateral low back pain with sciatica 03/01/2017  . OSA (obstructive sleep apnea) 01/04/2017  . Vaginal dryness, menopausal 08/11/2016  . Healthcare maintenance 08/11/2016  . Obesity 06/23/2016  . Screening for breast cancer 06/23/2016  . Right ear pain 12/30/2015  . 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 Bad odor of urine 12/04/2014  . Epigastric pain 12/04/2014  . Uncontrolled hypertension 12/04/2014  . Malodorous urine 07/07/2014  . Menopausal hot flushes 07/07/2014  . 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 Insomnia 09/02/2013  . Fibroid 09/02/2013  . 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 Benign paroxysmal positional  vertigo, bilateral 07/02/2013  . Dysmenorrhea 08/17/2012  . Menorrhagia 08/17/2012  . DEPRESSION 07/21/2010  . HYPERTENSION, BENIGN ESSENTIAL 07/21/2010  . UNSPECIFIED VITAMIN D DEFICIENCY 01/05/2010  . OVERWEIGHT 04/29/2008  . ANEMIA 04/29/2008  . Fatigue 04/29/2008  . PSORIASIS 04/04/2008  . IBS 03/10/2008  . Other specified disorders of bladder 03/10/2008  . VAGINITIS, BACTERIAL, RECURRENT 03/10/2008    Past Surgical History:  Procedure Laterality Date  . PARATHYROIDECTOMY N/A 05/09/2019   Procedure: PARATHYROIDECTOMY;  Surgeon: Armandina Gemma, MD;  Location: [facility];  Service: General;  Laterality: N/A;  . TUBAL LIGATION    . TUBAL LIGATION       OB History    Gravida  3   Para  2   Term  2   Preterm      AB  1   Living  2     SAB  1   TAB  0   Ectopic  0   Multiple  0   Live Births               Home Medications    Prior to Admission medications   Medication Sig Start Date End Date Taking? Authorizing Provider  cetirizine (ZYRTEC) 10 MG tablet Take 1 tablet (10 mg total) by mouth daily. Patient taking differently: Take 10 mg by mouth daily as needed for allergies.  03/22/18   Charlott Rakes, MD  cloNIDine (CATAPRES) 0.1 MG tablet Take 1 tablet (0.1 mg total) by mouth daily. For hot flashes 01/02/19   Charlott Rakes, MD  clotrimazole-betamethasone (LOTRISONE) cream Apply 1  application topically 2 (two) times daily. Patient taking differently: Apply 1 application topically 2 (two) times daily as needed (rash).  11/20/18   Gildardo Pounds, NP  fluticasone (FLONASE) 50 MCG/ACT nasal spray Place 1 spray into both nostrils daily. Patient taking differently: Place 1 spray into both nostrils daily as needed for allergies.  11/20/18   Gildardo Pounds, NP  gabapentin (NEURONTIN) 300 MG capsule Take 1 capsule (300 mg total) by mouth 3 (three) times daily. Patient taking differently: Take 900 mg by mouth at bedtime.  01/02/19   Charlott Rakes, MD   lisinopril-hydrochlorothiazide (PRINZIDE,ZESTORETIC) 20-25 MG tablet Take 1 tablet by mouth daily. 01/02/19   Charlott Rakes, MD  meclizine (ANTIVERT) 25 MG tablet Take 1 tablet (25 mg total) by mouth 3 (three) times daily as needed for dizziness. 11/20/18   Gildardo Pounds, NP  methocarbamol (ROBAXIN) 500 MG tablet Take 1 tablet (500 mg total) 3 (three) times daily by mouth. X 10 days then prn Patient taking differently: Take 500 mg by mouth every 8 (eight) hours as needed for muscle spasms.  10/03/17   Argentina Donovan, PA-C  Multiple Vitamin (MULTIVITAMIN WITH MINERALS) TABS tablet Take 1 tablet by mouth daily.    [provider]  Probiotic Product (PROBIOTIC PO) Take 1 capsule by mouth daily.    [provider]  traMADol (ULTRAM) 50 MG tablet Take 1-2 tablets (50-100 mg total) by mouth every 6 (six) hours as needed. 05/10/19   Armandina Gemma, MD    Family History Family History  Problem Relation Age of Onset  . Thyroid disease Sister   . Cancer Maternal Aunt        breast  . Diabetes Neg Hx     Social History Social History   Tobacco Use  . Smoking status: Never Smoker  . Smokeless tobacco: Never Used  Substance Use Topics  . Alcohol use: Yes    Alcohol/week: 6.0 standard drinks    Types: 2 Glasses of wine, 4 Standard drinks or equivalent per week    Comment: 2 glasses of wine daily  . Drug use: No     Allergies   Acetaminophen, Aspirin, Codeine, Oxycodone-acetaminophen, and Vicodin [hydrocodone-acetaminophen]   Review of Systems Review of Systems  Constitutional: Positive for fatigue. Negative for chills and fever.  HENT: Negative.  Negative for congestion, sore throat and trouble swallowing.   Respiratory: Negative.  Negative for cough and shortness of breath.   Cardiovascular: Negative.  Negative for chest pain.  Gastrointestinal: Positive for nausea. Negative for abdominal pain, diarrhea and vomiting.  Genitourinary: Negative for dysuria and  frequency.  Musculoskeletal: Negative.  Negative for myalgias and neck pain.  Skin: Negative.   Neurological: Positive for weakness and light-headedness. Negative for syncope.     Physical Exam Updated Vital Signs BP 105/66 (BP Location: Right Arm)   Pulse (!) 57   Temp 98.1 F (36.7 C) (Oral)   Resp 18   Ht 5\' 6"  (1.676 m)   Wt 89.4 kg   LMP 11/19/2016   SpO2 99%   BMI 31.80 kg/m   Physical Exam Vitals signs and nursing note reviewed.  Constitutional:      Appearance: Normal appearance. She is not toxic-appearing.  HENT:     Head: Normocephalic.     Mouth/Throat:     Mouth: Mucous membranes are moist.  Eyes:     Conjunctiva/sclera: Conjunctivae normal.  Neck:     Musculoskeletal: Normal range of motion and neck supple.  Comments: Anterior incision without redness or associated swelling. No wound dehiscence or drainage.  Cardiovascular:     Rate and Rhythm: Normal rate.  Pulmonary:     Effort: Pulmonary effort is normal.     Breath sounds: No wheezing, rhonchi or rales.  Abdominal:     General: There is no distension.  Musculoskeletal: Normal range of motion.  Skin:    General: Skin is warm and dry.  Neurological:     General: No focal deficit present.     Mental Status: She is alert and oriented to person, place, and time.      ED Treatments / Results  Labs (all labs ordered are listed, but only abnormal results are displayed) Labs Reviewed  CBC - Abnormal; Notable for the following components:      Result Value   RBC 3.44 (*)    Hemoglobin 10.9 (*)    HCT 33.8 (*)    All other components within normal limits  BASIC METABOLIC PANEL - Abnormal; Notable for the following components:   Potassium 2.9 (*)    Glucose, Bld 112 (*)    Calcium 7.8 (*)    All other components within normal limits    EKG    Radiology No results found.  Procedures Procedures (including critical care time)  Medications Ordered in ED Medications  potassium chloride  10 mEq in 100 mL IVPB (10 mEq Intravenous New Bag/Given 05/12/19 2310)  sodium chloride 0.9 % bolus 1,000 mL (1,000 mLs Intravenous New Bag/Given 05/12/19 2312)  potassium chloride SA (K-DUR) CR tablet 20 mEq (20 mEq Oral Given 05/12/19 2313)  ondansetron (ZOFRAN) 4 MG/2ML injection (4 mg  Given 05/12/19 2306)     Initial Impression / Assessment and Plan / ED Course  I have reviewed the triage vital signs and the nursing notes.  Pertinent labs & imaging results that were available during my care of the patient were reviewed by me and considered in my medical decision making (see chart for details).        1:00 - Patient feeling no better after fluids. She reports nausea persists after Zofran but no vomiting. Up to the bathroom without assistance but still feels weak and lightheaded.   3:00 - Calcium supplement provided. PHenergan for persistent nausea. Nausea improved, but she states she still have generalized weakness, lightheadedness, and diplopia, which she did not mention initially.   Discussed with Dr. Betsey Holiday. Surgical notes reviewed which states one parathyroid gland was removed, others remain. Do not feel current symptoms are related to recent surgical procedure. Head CT ordered for full evaluation. Doubt CVA, bleed.  Head CT negative. Discussed work up with the patient and offered reassurance. She is felt appropriate for discharge home with plan to contact PCP, surgeon or both today for recheck. Discussed return precautions.   Final Clinical Impressions(s) / ED Diagnoses   Final diagnoses:  None   1. Lightheadedness 2. Paresthesias  ED Discharge Orders    None       Charlann Lange, Hershal Coria 05/13/19 0630    Hayden Rasmussen, MD 05/13/19 [phone]

## 2019-05-12 NOTE — ED Triage Notes (Signed)
Pt reports she had parathyroid surgery here on Thursday. Reports that she had facial numbness and fingers tingling that started about an hour ago. Also having right arm pains. Has no neuro deficits in triage

## 2019-05-12 NOTE — ED Triage Notes (Signed)
Arrived by [facility] from home. Patient c/o blurred vision, facial numbness, generalized weakness, and chills that started after taking BP medication this morning. GCS 15. NAD

## 2019-05-12 NOTE — ED Notes (Signed)
Bed: WA04 Expected date:  Expected time:  Means of arrival:  Comments: F/Parathyroid sx chills/dizziness

## 2019-05-13 ENCOUNTER — Emergency Department (HOSPITAL_COMMUNITY): Payer: Self-pay

## 2019-05-13 LAB — URINALYSIS, ROUTINE W REFLEX MICROSCOPIC
Bilirubin Urine: NEGATIVE
Glucose, UA: NEGATIVE mg/dL
Hgb urine dipstick: NEGATIVE
Ketones, ur: NEGATIVE mg/dL
Leukocytes,Ua: NEGATIVE
Nitrite: NEGATIVE
Protein, ur: NEGATIVE mg/dL
Specific Gravity, Urine: 1.011 (ref 1.005–1.030)
pH: 5 (ref 5.0–8.0)

## 2019-05-13 LAB — I-STAT CHEM 8, ED
BUN: 12 mg/dL (ref 6–20)
Calcium, Ion: 0.97 mmol/L — ABNORMAL LOW (ref 1.15–1.40)
Chloride: 107 mmol/L (ref 98–111)
Creatinine, Ser: 0.8 mg/dL (ref 0.44–1.00)
Glucose, Bld: 96 mg/dL (ref 70–99)
HCT: 29 % — ABNORMAL LOW (ref 36.0–46.0)
Hemoglobin: 9.9 g/dL — ABNORMAL LOW (ref 12.0–15.0)
Potassium: 3.2 mmol/L — ABNORMAL LOW (ref 3.5–5.1)
Sodium: 143 mmol/L (ref 135–145)
TCO2: 25 mmol/L (ref 22–32)

## 2019-05-13 MED ORDER — SODIUM CHLORIDE 0.9 % IV SOLN
1.0000 g | Freq: Once | INTRAVENOUS | Status: AC
  Administered 2019-05-13: 1 g via INTRAVENOUS
  Filled 2019-05-13: qty 10

## 2019-05-13 MED ORDER — PROMETHAZINE HCL 25 MG/ML IJ SOLN
12.5000 mg | Freq: Once | INTRAMUSCULAR | Status: AC
  Administered 2019-05-13: 12.5 mg via INTRAVENOUS
  Filled 2019-05-13: qty 1

## 2019-05-13 NOTE — ED Notes (Signed)
Bed: KN39 Expected date:  Expected time:  Means of arrival:  Comments: Mare Ferrari

## 2019-05-13 NOTE — Discharge Instructions (Addendum)
Follow up with primary care and/or with Dr. Harlow Asa later today to advise them of your symptoms and your work up in the ED.   Please return if you have any new or concerning symptoms.

## 2019-05-16 ENCOUNTER — Telehealth (HOSPITAL_BASED_OUTPATIENT_CLINIC_OR_DEPARTMENT_OTHER): Payer: Self-pay | Admitting: Physician Assistant

## 2019-05-16 DIAGNOSIS — J329 Chronic sinusitis, unspecified: Secondary | ICD-10-CM

## 2019-05-16 DIAGNOSIS — K5900 Constipation, unspecified: Secondary | ICD-10-CM

## 2019-05-16 MED ORDER — FLUCONAZOLE 150 MG PO TABS: 150.0000 mg | ORAL_TABLET | Freq: Once | ORAL | 0 refills | Status: AC

## 2019-05-16 MED ORDER — AMOXICILLIN 500 MG PO CAPS: 1000.0000 mg | ORAL_CAPSULE | Freq: Two times a day (BID) | ORAL | 0 refills | Status: DC

## 2019-05-16 MED ORDER — POLYETHYLENE GLYCOL 3350 17 GM/SCOOP PO POWD: 17.0000 g | Freq: Two times a day (BID) | ORAL | 1 refills | Status: DC | PRN

## 2019-05-16 NOTE — Progress Notes (Signed)
Patient ID: Katelyn Hardin, female   DOB: 06-16-1971, 48 y.o.   MRN: [id number] Virtual Visit via Video Note  I connected with Katelyn Hardin on 05/16/19 at  9:10 AM EDT by a video enabled telemedicine application and verified that I am speaking with the correct person using two identifiers.   I discussed the limitations of evaluation and management by telemedicine and the availability of in person appointments. The patient expressed understanding and agreed to proceed.  Patient location: home My Location:  [facility] office Persons on the video: me and the patient.  History of Present Illness: Patient had a parathyroidectomy earlier this month.  Has bloodwork follow-up today.    She has been having sinus pain and congestion for about 1 week.  + sinus pain and pressure with HA.  Took zyrtec which helped a little.  No using flonase.  No fever.  No cough.    Seen in ED 05/12/2019 for dizziness and light-headedness with diplopia.  Those symptoms have resolved.  She still has mild intermittent nausea and c/o constipation.  Constipation is an ongoing problem for her.  No melena/hematochezia   Observations/Objective: A&Ox3.  Assessment and Plan: 1. Sinusitis, unspecified chronicity, unspecified location -resume flonase - amoxicillin (AMOXIL) 500 MG capsule; Take 2 capsules (1,000 mg total) by mouth 2 (two) times daily.  Dispense: 40 capsule; Refill: 0 - fluconazole (DIFLUCAN) 150 MG tablet; Take 1 tablet (150 mg total) by mouth once for 1 dose.  Dispense: 1 tablet; Refill: 0  2. Constipation, unspecified constipation type Drink ~80 ounces water daily - polyethylene glycol powder (GLYCOLAX/MIRALAX) 17 GM/SCOOP powder; Take 17 g by mouth 2 (two) times daily as needed.  Dispense: 3350 g; Refill: 1   Follow Up Instructions: See PCP 6 weeks   I discussed the assessment and treatment plan with the patient. The patient was provided an opportunity to ask questions and all were answered.  The patient agreed with the plan and demonstrated an understanding of the instructions.   The patient was advised to call back or seek an in-person evaluation if the symptoms worsen or if the condition fails to improve as anticipated.  I provided 16 minutes of non-face-to-face time during this encounter.   Freeman Caldron, PA-C

## 2019-05-19 ENCOUNTER — Encounter: Payer: Self-pay | Admitting: Family Medicine

## 2019-05-20 ENCOUNTER — Other Ambulatory Visit: Payer: Self-pay | Admitting: Family Medicine

## 2019-05-20 MED ORDER — CLOTRIMAZOLE-BETAMETHASONE 1-0.05 % EX CREA: 1.0000 "application " | TOPICAL_CREAM | Freq: Two times a day (BID) | CUTANEOUS | 1 refills | Status: DC | PRN

## 2019-07-04 ENCOUNTER — Encounter: Payer: Self-pay | Admitting: Family Medicine

## 2019-07-04 ENCOUNTER — Encounter (HOSPITAL_COMMUNITY): Payer: Self-pay | Admitting: Emergency Medicine

## 2019-07-04 ENCOUNTER — Other Ambulatory Visit: Payer: Self-pay

## 2019-07-04 ENCOUNTER — Ambulatory Visit (HOSPITAL_COMMUNITY)
Admission: EM | Admit: 2019-07-04 | Discharge: 2019-07-04 | Disposition: A | Discharge status: Home / Self Care | Payer: Self-pay | Attending: Family Medicine | Admitting: Family Medicine

## 2019-07-04 DIAGNOSIS — S81812A Laceration without foreign body, left lower leg, initial encounter: Secondary | ICD-10-CM

## 2019-07-04 MED ORDER — TETANUS-DIPHTH-ACELL PERTUSSIS 5-2.5-18.5 LF-MCG/0.5 IM SUSP
INTRAMUSCULAR | Status: AC
  Filled 2019-07-04: qty 0.5

## 2019-07-04 MED ORDER — TETANUS-DIPHTH-ACELL PERTUSSIS 5-2.5-18.5 LF-MCG/0.5 IM SUSP
0.5000 mL | Freq: Once | INTRAMUSCULAR | Status: AC
  Administered 2019-07-04: 0.5 mL via INTRAMUSCULAR

## 2019-07-04 NOTE — Discharge Instructions (Addendum)
We updated your tetanus today. Please keep wound clean and dry, wash at least twice daily with warm soapy water and dry well afterward.  If developing signs of infection including increased redness swelling pain or drainage please follow-up.  Wound should gradually heal over time Please limit use of antibiotic ointments to once a day and a thin amount to avoid too much moisture on wound as it is healing  Please follow-up if developing any concerns regarding your wound

## 2019-07-04 NOTE — ED Triage Notes (Signed)
Pt here for laceration to left ankle from knife 2 days ago

## 2019-07-05 NOTE — ED Provider Notes (Signed)
[facility]    CSN: [id number] Arrival date & time: 07/04/19  1518      History   Chief Complaint Chief Complaint  Patient presents with  . Laceration    HPI Katelyn Hardin is a 48 y.o. female presenting today for evaluation of a laceration.  Patient sustained a laceration to her Left ankle 2 days ago.  She accidentally dropped a knife and it cut her ankle.  She is been trying to keep it clean, applying antibiotic ointment, attempted using Steri-Strips and various bandages to bring wound edges together, but she has not had success.  She is presenting today for evaluation of this.  Her last tetanus was in 2009.  Denies difficulty bending at the ankle.  Denies numbness or tingling.  HPI  Past Medical History:  Diagnosis Date  . Anemia   . Bacterial vaginosis   . Complication of anesthesia    Slow to wake up  . Fibroids   . History of hiatal hernia   . History of kidney stones   . Hypertension   . IBS (irritable bowel syndrome)     Patient Active Problem List   Diagnosis Date Noted  . Primary hyperparathyroidism ([place]) 05/09/2019  . Hyperparathyroidism, primary ([place]) 04/16/2019  . Multiple thyroid nodules 04/16/2019  . Acute cystitis without hematuria 05/31/2017  . Acute bilateral low back pain with sciatica 03/01/2017  . OSA (obstructive sleep apnea) 01/04/2017  . Vaginal dryness, menopausal 08/11/2016  . Healthcare maintenance 08/11/2016  . Obesity 06/23/2016  . Screening for breast cancer 06/23/2016  . Right ear pain 12/30/2015  . 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 Bad odor of urine 12/04/2014  . Epigastric pain 12/04/2014  . Uncontrolled hypertension 12/04/2014  . Malodorous urine 07/07/2014  . Menopausal hot flushes 07/07/2014  . 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 Insomnia 09/02/2013  . Fibroid 09/02/2013  . 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 Benign paroxysmal positional vertigo, bilateral 07/02/2013  . Dysmenorrhea 08/17/2012  . Menorrhagia  08/17/2012  . DEPRESSION 07/21/2010  . HYPERTENSION, BENIGN ESSENTIAL 07/21/2010  . UNSPECIFIED VITAMIN D DEFICIENCY 01/05/2010  . OVERWEIGHT 04/29/2008  . ANEMIA 04/29/2008  . Fatigue 04/29/2008  . PSORIASIS 04/04/2008  . IBS 03/10/2008  . Other specified disorders of bladder 03/10/2008  . VAGINITIS, BACTERIAL, RECURRENT 03/10/2008    Past Surgical History:  Procedure Laterality Date  . PARATHYROIDECTOMY N/A 05/09/2019   Procedure: PARATHYROIDECTOMY;  Surgeon: Armandina Gemma, MD;  Location: [facility];  Service: General;  Laterality: N/A;  . TUBAL LIGATION    . TUBAL LIGATION      OB History    Gravida  3   Para  2   Term  2   Preterm      AB  1   Living  2     SAB  1   TAB  0   Ectopic  0   Multiple  0   Live Births               Home Medications    Prior to Admission medications   Medication Sig Start Date End Date Taking? Authorizing Provider  cetirizine (ZYRTEC) 10 MG tablet Take 1 tablet (10 mg total) by mouth daily. Patient taking differently: Take 10 mg by mouth daily as needed for allergies.  03/22/18   Charlott Rakes, MD  cloNIDine (CATAPRES) 0.1 MG tablet Take 1 tablet (0.1 mg total) by mouth daily. For hot flashes 01/02/19   Charlott Rakes, MD  clotrimazole-betamethasone (LOTRISONE) cream Apply  1 application topically 2 (two) times daily as needed (rash). 05/20/19   Charlott Rakes, MD  fluticasone (FLONASE) 50 MCG/ACT nasal spray Place 1 spray into both nostrils daily. Patient taking differently: Place 1 spray into both nostrils daily as needed for allergies.  11/20/18   Gildardo Pounds, NP  gabapentin (NEURONTIN) 300 MG capsule Take 1 capsule (300 mg total) by mouth 3 (three) times daily. Patient taking differently: Take 900 mg by mouth at bedtime.  01/02/19   Charlott Rakes, MD  lisinopril-hydrochlorothiazide (PRINZIDE,ZESTORETIC) 20-25 MG tablet Take 1 tablet by mouth daily. 01/02/19   Charlott Rakes, MD  meclizine (ANTIVERT) 25 MG tablet Take 1  tablet (25 mg total) by mouth 3 (three) times daily as needed for dizziness. 11/20/18   Gildardo Pounds, NP  methocarbamol (ROBAXIN) 500 MG tablet Take 1 tablet (500 mg total) 3 (three) times daily by mouth. X 10 days then prn Patient taking differently: Take 500 mg by mouth every 8 (eight) hours as needed for muscle spasms.  10/03/17   Argentina Donovan, PA-C  Multiple Vitamin (MULTIVITAMIN WITH MINERALS) TABS tablet Take 1 tablet by mouth daily.    [provider]  polyethylene glycol powder (GLYCOLAX/MIRALAX) 17 GM/SCOOP powder Take 17 g by mouth 2 (two) times daily as needed. 05/16/19   Argentina Donovan, PA-C  Probiotic Product (PROBIOTIC PO) Take 1 capsule by mouth daily.    [provider]  traMADol (ULTRAM) 50 MG tablet Take 1-2 tablets (50-100 mg total) by mouth every 6 (six) hours as needed. Patient taking differently: Take 50-100 mg by mouth every 6 (six) hours as needed for moderate pain or severe pain.  05/10/19   Armandina Gemma, MD    Family History Family History  Problem Relation Age of Onset  . Thyroid disease Sister   . Cancer Maternal Aunt        breast  . Diabetes Neg Hx     Social History Social History   Tobacco Use  . Smoking status: Never Smoker  . Smokeless tobacco: Never Used  Substance Use Topics  . Alcohol use: Yes    Alcohol/week: 6.0 standard drinks    Types: 2 Glasses of wine, 4 Standard drinks or equivalent per week    Comment: 2 glasses of wine daily  . Drug use: No     Allergies   Acetaminophen, Aspirin, Codeine, Oxycodone-acetaminophen, and Vicodin [hydrocodone-acetaminophen]   Review of Systems Review of Systems  Constitutional: Negative for fatigue and fever.  Eyes: Negative for visual disturbance.  Respiratory: Negative for shortness of breath.   Cardiovascular: Negative for chest pain.  Gastrointestinal: Negative for abdominal pain, nausea and vomiting.  Musculoskeletal: Negative for arthralgias, gait problem and joint  swelling.  Skin: Positive for wound. Negative for color change and rash.  Neurological: Negative for dizziness, weakness, light-headedness and headaches.     Physical Exam Triage Vital Signs ED Triage Vitals  Enc Vitals Group     BP 07/04/19 1547 138/80     Pulse Rate 07/04/19 1547 75     Resp 07/04/19 1547 18     Temp 07/04/19 1547 98.3 F (36.8 C)     Temp Source 07/04/19 1547 Oral     SpO2 07/04/19 1547 99 %     Weight --      Height --      Head Circumference --      Peak Flow --      Pain Score 07/04/19 1548 3  Pain Loc --      Pain Edu? --      Excl. in [place]? --    No data found.  Updated Vital Signs BP 138/80 (BP Location: Right Arm)   Pulse 75   Temp 98.3 F (36.8 C) (Oral)   Resp 18   LMP 11/19/2016   SpO2 99%   Visual Acuity Right Eye Distance:   Left Eye Distance:   Bilateral Distance:    Right Eye Near:   Left Eye Near:    Bilateral Near:     Physical Exam Vitals signs and nursing note reviewed.  Constitutional:      Appearance: She is well-developed.     Comments: No acute distress  HENT:     Head: Normocephalic and atraumatic.     Nose: Nose normal.  Eyes:     Conjunctiva/sclera: Conjunctivae normal.  Neck:     Musculoskeletal: Neck supple.  Cardiovascular:     Rate and Rhythm: Normal rate.  Pulmonary:     Effort: Pulmonary effort is normal. No respiratory distress.  Abdominal:     General: There is no distension.  Musculoskeletal: Normal range of motion.     Comments:  Full active range of motion of left knee and ankle Dorsalis pedis 2+  Skin:    General: Skin is warm and dry.     Comments: 4 cm linear laceration to medial aspect of left ankle, approximately 0.25 cm gaping.  No drainage.  Immediate wound edges appear slightly white, no erythema or warmth  With manipulating wound, edges posteriorly seem attached and is not significantly mobile  Neurological:     Mental Status: She is alert and oriented to person, place, and  time.        UC Treatments / Results  Labs (all labs ordered are listed, but only abnormal results are displayed) Labs Reviewed - No data to display  EKG   Radiology No results found.  Procedures Procedures (including critical care time)  Medications Ordered in UC Medications  Tdap (BOOSTRIX) injection 0.5 mL (0.5 mLs Intramuscular Given 07/04/19 1624)  Tdap (BOOSTRIX) 5-2.5-18.5 LF-MCG/0.5 injection (has no administration in time range)    Initial Impression / Assessment and Plan / UC Course  I have reviewed the triage vital signs and the nursing notes.  Pertinent labs & imaging results that were available during my care of the patient were reviewed by me and considered in my medical decision making (see chart for details).    Wound cleaned with warm soapy water, Shur-Clens and irrigated with sterile water.  Nonadherent dressing applied  77-day-old wound, do not recommend suture repair at this time given wound appears to be starting to heal,.  Would expect wound to still heal well.  Recommended keeping clean and dry.  Avoid antibiotic ointment temporarily to prevent too much moisture.  Tetanus updated.  Does not have any MSK involvement. Final Clinical Impressions(s) / UC Diagnoses   Final diagnoses:  Laceration of left lower extremity, initial encounter     Discharge Instructions     We updated your tetanus today. Please keep wound clean and dry, wash at least twice daily with warm soapy water and dry well afterward.  If developing signs of infection including increased redness swelling pain or drainage please follow-up.  Wound should gradually heal over time Please limit use of antibiotic ointments to once a day and a thin amount to avoid too much moisture on wound as it is healing  Please follow-up if  developing any concerns regarding your wound   ED Prescriptions    None     Controlled Substance Prescriptions [place] Controlled Substance Registry consulted?  Not Applicable   Janith Lima, [place] 07/05/19 1022

## 2019-07-09 ENCOUNTER — Encounter: Payer: Self-pay | Admitting: Family Medicine

## 2019-07-09 ENCOUNTER — Ambulatory Visit: Payer: Self-pay | Attending: Family Medicine | Admitting: Family Medicine

## 2019-07-09 ENCOUNTER — Other Ambulatory Visit: Payer: Self-pay

## 2019-07-09 ENCOUNTER — Ambulatory Visit: Payer: Self-pay

## 2019-07-09 DIAGNOSIS — I10 Essential (primary) hypertension: Secondary | ICD-10-CM

## 2019-07-09 DIAGNOSIS — R200 Anesthesia of skin: Secondary | ICD-10-CM

## 2019-07-09 DIAGNOSIS — R5383 Other fatigue: Secondary | ICD-10-CM

## 2019-07-09 DIAGNOSIS — Z9009 Acquired absence of other part of head and neck: Secondary | ICD-10-CM

## 2019-07-09 DIAGNOSIS — Z1239 Encounter for other screening for malignant neoplasm of breast: Secondary | ICD-10-CM

## 2019-07-09 DIAGNOSIS — E892 Postprocedural hypoparathyroidism: Secondary | ICD-10-CM

## 2019-07-09 DIAGNOSIS — R202 Paresthesia of skin: Secondary | ICD-10-CM

## 2019-07-09 MED ORDER — LISINOPRIL-HYDROCHLOROTHIAZIDE 20-25 MG PO TABS: 1.0000 | ORAL_TABLET | Freq: Every day | ORAL | 1 refills | Status: DC

## 2019-07-09 NOTE — Progress Notes (Signed)
Patient has been called and DOB has been verified. Patient has been screened and transferred to PCP to start phone visit.     

## 2019-07-09 NOTE — Addendum Note (Signed)
Addended byCharlott Rakes on: 07/09/2019 01:26 PM   Modules accepted: Orders

## 2019-07-09 NOTE — Progress Notes (Signed)
Virtual Visit via Telephone Note  I connected with Katelyn Hardin, on 07/09/2019 at 11:34 AM by telephone due to the COVID-19 pandemic and verified that I am speaking with the correct person using two identifiers.   Consent: I discussed the limitations, risks, security and privacy concerns of performing an evaluation and management service by telephone and the availability of in person appointments. I also discussed with the patient that there may be a patient responsible charge related to this service. The patient expressed understanding and agreed to proceed.   Location of Patient: Work  Biomedical scientist of Provider: Clinic   Persons participating in Telemedicine visit: Jaquasha Carnevale Farrington-CMA Dr. Felecia Shelling     History of Present Illness: Katelyn Hardin is a 48 year old female with a history of hypertension, hyperparathyroidism, vertigo, sciatica, hyperparathyroidism status post parathyroidectomy in 04/2019 who presents today for a follow-up visit. She complains of numbness and tingling in her legs and arms and lower extremities ever since her surgery.  She is followed by Dr.Gerkin of general surgery whom she last saw her last month and was informed after her last labs in the past month that she was hypocalcemic and advised to take Tums.  She does not recall her labs by heart. She complains of persisting fatigue as well.  Compliant with her antihypertensive and denies adverse effects. She denies chest pain, dyspnea or other adverse effects of her medications.  Past Medical History:  Diagnosis Date  . Anemia   . Bacterial vaginosis   . Complication of anesthesia    Slow to wake up  . Fibroids   . History of hiatal hernia   . History of kidney stones   . Hypertension   . IBS (irritable bowel syndrome)    Allergies  Allergen Reactions  . Acetaminophen Other (See Comments)    Jittery and upset stomach  . Aspirin Nausea And Vomiting  . Codeine    Jittery and upset stomach  . Oxycodone-Acetaminophen     Jittery and upset stomach  . Vicodin [Hydrocodone-Acetaminophen]     Jittery and upset stomach    Current Outpatient Medications on File Prior to Visit  Medication Sig Dispense Refill  . cetirizine (ZYRTEC) 10 MG tablet Take 1 tablet (10 mg total) by mouth daily. (Patient taking differently: Take 10 mg by mouth daily as needed for allergies. ) 30 tablet 3  . cloNIDine (CATAPRES) 0.1 MG tablet Take 1 tablet (0.1 mg total) by mouth daily. For hot flashes 90 tablet 1  . clotrimazole-betamethasone (LOTRISONE) cream Apply 1 application topically 2 (two) times daily as needed (rash). 45 g 1  . fluticasone (FLONASE) 50 MCG/ACT nasal spray Place 1 spray into both nostrils daily. (Patient taking differently: Place 1 spray into both nostrils daily as needed for allergies. ) 16 g 2  . gabapentin (NEURONTIN) 300 MG capsule Take 1 capsule (300 mg total) by mouth 3 (three) times daily. (Patient taking differently: Take 900 mg by mouth at bedtime. ) 90 capsule 1  . lisinopril-hydrochlorothiazide (PRINZIDE,ZESTORETIC) 20-25 MG tablet Take 1 tablet by mouth daily. 90 tablet 1  . meclizine (ANTIVERT) 25 MG tablet Take 1 tablet (25 mg total) by mouth 3 (three) times daily as needed for dizziness. 60 tablet 3  . methocarbamol (ROBAXIN) 500 MG tablet Take 1 tablet (500 mg total) 3 (three) times daily by mouth. X 10 days then prn (Patient taking differently: Take 500 mg by mouth every 8 (eight) hours as needed for muscle spasms. ) 120 tablet 0  .  Multiple Vitamin (MULTIVITAMIN WITH MINERALS) TABS tablet Take 1 tablet by mouth daily.    . polyethylene glycol powder (GLYCOLAX/MIRALAX) 17 GM/SCOOP powder Take 17 g by mouth 2 (two) times daily as needed. 3350 g 1  . Probiotic Product (PROBIOTIC PO) Take 1 capsule by mouth daily.    . traMADol (ULTRAM) 50 MG tablet Take 1-2 tablets (50-100 mg total) by mouth every 6 (six) hours as needed. (Patient not taking:  Reported on 07/09/2019) 15 tablet 0   No current facility-administered medications on file prior to visit.     Observations/Objective: Awake, alert, oriented x3 Not in acute distress  Assessment and Plan: 1. Numbness and tingling Could be secondary to hypocalcemia from parathyroidectomy We will check labs  2. Essential hypertension, benign Stable Counseled on blood pressure goal of less than 130/80, low-sodium, DASH diet, medication compliance, 150 minutes of moderate intensity exercise per week. Discussed medication compliance, adverse effects. - CMP14+EGFR; Future  3. Screening for breast cancer - MM Digital Screening; Future  4. H/O parathyroidectomy - Calcium, ionized; Future - VITAMIN D 25 Hydroxy (Vit-D Deficiency, Fractures); Future - PTH, Intact and Calcium; Future  5. Other fatigue - CBC with Differential/Platelet; Future - TSH; Future - T4, free; Future   Follow Up Instructions: Return in about 1 month (around 08/09/2019) for PAP smear.    I discussed the assessment and treatment plan with the patient. The patient was provided an opportunity to ask questions and all were answered. The patient agreed with the plan and demonstrated an understanding of the instructions.   The patient was advised to call back or seek an in-person evaluation if the symptoms worsen or if the condition fails to improve as anticipated.     I provided 20 minutes total of non-face-to-face time during this encounter including median intraservice time, reviewing previous notes, labs, imaging, medications, management and patient verbalized understanding.     Charlott Rakes, MD, FAAFP. [facility] and [place], [place]   07/09/2019, 11:34 AM

## 2019-07-10 ENCOUNTER — Other Ambulatory Visit: Payer: Self-pay | Admitting: Family Medicine

## 2019-07-10 LAB — CALCIUM, IONIZED: Calcium, Ion: 4.3 mg/dL — ABNORMAL LOW (ref 4.5–5.6)

## 2019-07-10 LAB — CBC WITH DIFFERENTIAL/PLATELET
Basophils Absolute: 0 10*3/uL (ref 0.0–0.2)
Basos: 1 %
EOS (ABSOLUTE): 0 10*3/uL (ref 0.0–0.4)
Eos: 1 %
Hematocrit: 37.3 % (ref 34.0–46.6)
Hemoglobin: 12 g/dL (ref 11.1–15.9)
Immature Grans (Abs): 0 10*3/uL (ref 0.0–0.1)
Immature Granulocytes: 0 %
Lymphocytes Absolute: 2.1 10*3/uL (ref 0.7–3.1)
Lymphs: 43 %
MCH: 30.7 pg (ref 26.6–33.0)
MCHC: 32.2 g/dL (ref 31.5–35.7)
MCV: 95 fL (ref 79–97)
Monocytes Absolute: 0.3 10*3/uL (ref 0.1–0.9)
Monocytes: 6 %
Neutrophils Absolute: 2.5 10*3/uL (ref 1.4–7.0)
Neutrophils: 49 %
Platelets: 373 10*3/uL (ref 150–450)
RBC: 3.91 x10E6/uL (ref 3.77–5.28)
RDW: 12.8 % (ref 11.7–15.4)
WBC: 4.9 10*3/uL (ref 3.4–10.8)

## 2019-07-10 LAB — CMP14+EGFR
ALT: 23 IU/L (ref 0–32)
AST: 23 IU/L (ref 0–40)
Albumin/Globulin Ratio: 1.2 (ref 1.2–2.2)
Albumin: 4 g/dL (ref 3.8–4.8)
Alkaline Phosphatase: 97 IU/L (ref 39–117)
BUN/Creatinine Ratio: 14 (ref 9–23)
BUN: 13 mg/dL (ref 6–24)
Bilirubin Total: 0.2 mg/dL (ref 0.0–1.2)
CO2: 25 mmol/L (ref 20–29)
Calcium: 8.2 mg/dL — ABNORMAL LOW (ref 8.7–10.2)
Chloride: 101 mmol/L (ref 96–106)
Creatinine, Ser: 0.92 mg/dL (ref 0.57–1.00)
GFR calc Af Amer: 85 mL/min/{1.73_m2} (ref >59)
GFR calc non Af Amer: 73 mL/min/{1.73_m2} (ref >59)
Globulin, Total: 3.3 g/dL (ref 1.5–4.5)
Glucose: 95 mg/dL (ref 65–99)
Potassium: 3.7 mmol/L (ref 3.5–5.2)
Sodium: 142 mmol/L (ref 134–144)
Total Protein: 7.3 g/dL (ref 6.0–8.5)

## 2019-07-10 LAB — VITAMIN D 25 HYDROXY (VIT D DEFICIENCY, FRACTURES): Vit D, 25-Hydroxy: 21.8 ng/mL — ABNORMAL LOW (ref 30.0–100.0)

## 2019-07-10 LAB — PTH, INTACT AND CALCIUM: PTH: 53 pg/mL (ref 15–65)

## 2019-07-10 LAB — MAGNESIUM: Magnesium: 2.3 mg/dL (ref 1.6–2.3)

## 2019-07-10 LAB — TSH: TSH: 0.715 u[IU]/mL (ref 0.450–4.500)

## 2019-07-10 LAB — T4, FREE: Free T4: 1.11 ng/dL (ref 0.82–1.77)

## 2019-07-10 MED ORDER — ERGOCALCIFEROL 1.25 MG (50000 UT) PO CAPS: 50000.0000 [IU] | ORAL_CAPSULE | ORAL | 1 refills | Status: DC

## 2019-07-10 MED ORDER — CALCIUM GLUCONATE 500 MG PO TABS: 1.0000 | ORAL_TABLET | Freq: Two times a day (BID) | ORAL | 2 refills | Status: DC

## 2019-07-11 ENCOUNTER — Encounter: Payer: Self-pay | Admitting: Family Medicine

## 2019-07-12 ENCOUNTER — Encounter: Payer: Self-pay | Admitting: Family Medicine

## 2019-07-16 ENCOUNTER — Encounter (HOSPITAL_COMMUNITY): Payer: Self-pay | Admitting: Emergency Medicine

## 2019-07-16 ENCOUNTER — Ambulatory Visit (HOSPITAL_COMMUNITY)
Admission: EM | Admit: 2019-07-16 | Discharge: 2019-07-16 | Disposition: A | Discharge status: Home / Self Care | Payer: Self-pay

## 2019-07-16 ENCOUNTER — Other Ambulatory Visit: Payer: Self-pay

## 2019-07-16 DIAGNOSIS — Z5189 Encounter for other specified aftercare: Secondary | ICD-10-CM

## 2019-07-16 NOTE — ED Triage Notes (Signed)
Pt here for wound check of her laceration to her L ankle. Pt states she came too late for stitches but it keeps opening up due to her walking a lot. Pt wants to be sure its not infected.

## 2019-07-16 NOTE — Discharge Instructions (Addendum)
Wound is healing. It will take a while to heal based on the area of the wound. Use the ace wrap as needed. Do not clean anymore with peroxide. Normal soap and water is fine.  Follow up as needed for continued or worsening symptoms

## 2019-07-17 NOTE — ED Provider Notes (Signed)
[facility]    CSN: [id number] Arrival date & time: 07/16/19  1421      History   Chief Complaint Chief Complaint  Patient presents with  . Wound Check    HPI Katelyn Hardin is a 48 y.o. female.   Pt is a 48 year old female that presents today for wound recheck. She was seen here on 07/04/19 and the wound was already healing at that point and no indication for suturing. She has been cleaning multiple times a day with peroxide and sts that the wound will scab over and then open back up. She has also been putting neosporin on it. Wearing low cut shoes to avoid friction. Mild swelling near the site but no redness or drainage. No fever.   ROS per HPI      Past Medical History:  Diagnosis Date  . Anemia   . Bacterial vaginosis   . Complication of anesthesia    Slow to wake up  . Fibroids   . History of hiatal hernia   . History of kidney stones   . Hypertension   . IBS (irritable bowel syndrome)     Patient Active Problem List   Diagnosis Date Noted  . Primary hyperparathyroidism ([place]) 05/09/2019  . Hyperparathyroidism, primary ([place]) 04/16/2019  . Multiple thyroid nodules 04/16/2019  . Acute cystitis without hematuria 05/31/2017  . Acute bilateral low back pain with sciatica 03/01/2017  . OSA (obstructive sleep apnea) 01/04/2017  . Vaginal dryness, menopausal 08/11/2016  . Healthcare maintenance 08/11/2016  . Obesity 06/23/2016  . Screening for breast cancer 06/23/2016  . Right ear pain 12/30/2015  . 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 Bad odor of urine 12/04/2014  . Epigastric pain 12/04/2014  . Uncontrolled hypertension 12/04/2014  . Malodorous urine 07/07/2014  . Menopausal hot flushes 07/07/2014  . 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 Insomnia 09/02/2013  . Fibroid 09/02/2013  . 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 Benign paroxysmal positional vertigo, bilateral 07/02/2013  . Dysmenorrhea 08/17/2012  . Menorrhagia 08/17/2012  . DEPRESSION  07/21/2010  . HYPERTENSION, BENIGN ESSENTIAL 07/21/2010  . UNSPECIFIED VITAMIN D DEFICIENCY 01/05/2010  . OVERWEIGHT 04/29/2008  . ANEMIA 04/29/2008  . Fatigue 04/29/2008  . PSORIASIS 04/04/2008  . IBS 03/10/2008  . Other specified disorders of bladder 03/10/2008  . VAGINITIS, BACTERIAL, RECURRENT 03/10/2008    Past Surgical History:  Procedure Laterality Date  . PARATHYROIDECTOMY N/A 05/09/2019   Procedure: PARATHYROIDECTOMY;  Surgeon: Armandina Gemma, MD;  Location: [facility];  Service: General;  Laterality: N/A;  . TUBAL LIGATION    . TUBAL LIGATION      OB History    Gravida  3   Para  2   Term  2   Preterm      AB  1   Living  2     SAB  1   TAB  0   Ectopic  0   Multiple  0   Live Births               Home Medications    Prior to Admission medications   Medication Sig Start Date End Date Taking? Authorizing Provider  calcium gluconate 500 MG tablet Take 1 tablet (500 mg total) by mouth 2 (two) times daily. 07/10/19   Charlott Rakes, MD  cetirizine (ZYRTEC) 10 MG tablet Take 1 tablet (10 mg total) by mouth daily. Patient taking differently: Take 10 mg by mouth daily as needed for allergies.  03/22/18   Newlin, Charlane Ferretti,  MD  cloNIDine (CATAPRES) 0.1 MG tablet Take 1 tablet (0.1 mg total) by mouth daily. For hot flashes 01/02/19   Charlott Rakes, MD  clotrimazole-betamethasone (LOTRISONE) cream Apply 1 application topically 2 (two) times daily as needed (rash). 05/20/19   Charlott Rakes, MD  ergocalciferol (DRISDOL) 1.25 MG (50000 UT) capsule Take 1 capsule (50,000 Units total) by mouth once a week. 07/10/19   Charlott Rakes, MD  fluticasone (FLONASE) 50 MCG/ACT nasal spray Place 1 spray into both nostrils daily. Patient taking differently: Place 1 spray into both nostrils daily as needed for allergies.  11/20/18   Gildardo Pounds, NP  gabapentin (NEURONTIN) 300 MG capsule Take 1 capsule (300 mg total) by mouth 3 (three) times daily. Patient taking  differently: Take 900 mg by mouth at bedtime.  01/02/19   Charlott Rakes, MD  lisinopril-hydrochlorothiazide (ZESTORETIC) 20-25 MG tablet Take 1 tablet by mouth daily. 07/09/19   Charlott Rakes, MD  meclizine (ANTIVERT) 25 MG tablet Take 1 tablet (25 mg total) by mouth 3 (three) times daily as needed for dizziness. 11/20/18   Gildardo Pounds, NP  methocarbamol (ROBAXIN) 500 MG tablet Take 1 tablet (500 mg total) 3 (three) times daily by mouth. X 10 days then prn Patient taking differently: Take 500 mg by mouth every 8 (eight) hours as needed for muscle spasms.  10/03/17   Argentina Donovan, PA-C  Multiple Vitamin (MULTIVITAMIN WITH MINERALS) TABS tablet Take 1 tablet by mouth daily.    [provider]  polyethylene glycol powder (GLYCOLAX/MIRALAX) 17 GM/SCOOP powder Take 17 g by mouth 2 (two) times daily as needed. 05/16/19   Argentina Donovan, PA-C  Probiotic Product (PROBIOTIC PO) Take 1 capsule by mouth daily.    [provider]  traMADol (ULTRAM) 50 MG tablet Take 1-2 tablets (50-100 mg total) by mouth every 6 (six) hours as needed. Patient not taking: Reported on 07/09/2019 05/10/19   Armandina Gemma, MD    Family History Family History  Problem Relation Age of Onset  . Thyroid disease Sister   . Cancer Maternal Aunt        breast  . Diabetes Neg Hx     Social History Social History   Tobacco Use  . Smoking status: Never Smoker  . Smokeless tobacco: Never Used  Substance Use Topics  . Alcohol use: Yes    Alcohol/week: 6.0 standard drinks    Types: 2 Glasses of wine, 4 Standard drinks or equivalent per week    Comment: 2 glasses of wine daily  . Drug use: No     Allergies   Acetaminophen, Aspirin, Codeine, Oxycodone-acetaminophen, and Vicodin [hydrocodone-acetaminophen]   Review of Systems Review of Systems   Physical Exam Triage Vital Signs ED Triage Vitals  Enc Vitals Group     BP 07/16/19 1434 120/76     Pulse Rate 07/16/19 1434 76     Resp  07/16/19 1434 16     Temp 07/16/19 1434 97.7 F (36.5 C)     Temp src --      SpO2 07/16/19 1434 99 %     Weight --      Height --      Head Circumference --      Peak Flow --      Pain Score 07/16/19 1435 0     Pain Loc --      Pain Edu? --      Excl. in [place]? --    No data found.  Updated  Vital Signs BP 120/76   Pulse 76   Temp 97.7 F (36.5 C)   Resp 16   LMP 11/19/2016   SpO2 99%   Visual Acuity Right Eye Distance:   Left Eye Distance:   Bilateral Distance:    Right Eye Near:   Left Eye Near:    Bilateral Near:     Physical Exam Vitals signs and nursing note reviewed.  Constitutional:      Appearance: Normal appearance.  HENT:     Head: Normocephalic and atraumatic.     Nose: Nose normal.  Eyes:     Conjunctiva/sclera: Conjunctivae normal.  Neck:     Musculoskeletal: Normal range of motion.  Pulmonary:     Effort: Pulmonary effort is normal.  Musculoskeletal: Normal range of motion.  Skin:    General: Skin is warm and dry.     Comments: Wound appears to be healing. No signs of infection. No redness. Mild swelling.   Neurological:     Mental Status: She is alert.  Psychiatric:        Mood and Affect: Mood normal.      UC Treatments / Results  Labs (all labs ordered are listed, but only abnormal results are displayed) Labs Reviewed - No data to display  EKG   Radiology No results found.  Procedures Procedures (including critical care time)  Medications Ordered in UC Medications - No data to display  Initial Impression / Assessment and Plan / UC Course  I have reviewed the triage vital signs and the nursing notes.  Pertinent labs & imaging results that were available during my care of the patient were reviewed by me and considered in my medical decision making (see chart for details).     Reassured pt that the wound is healing and not infected.  There is delayed healing due to not getting the wound sutured in appropriate amount of  time. There is scabbing on the wound. Wrapped with ace wrap for swelling.  Follow up as needed for continued or worsening symptoms  Final Clinical Impressions(s) / UC Diagnoses   Final diagnoses:  Encounter for wound re-check     Discharge Instructions     Wound is healing. It will take a while to heal based on the area of the wound. Use the ace wrap as needed. Do not clean anymore with peroxide. Normal soap and water is fine.  Follow up as needed for continued or worsening symptoms     ED Prescriptions    None     Controlled Substance Prescriptions [place] Controlled Substance Registry consulted? Not Applicable   Orvan July, NP 07/17/19 [phone]

## 2019-07-23 ENCOUNTER — Encounter: Payer: Self-pay | Admitting: Family Medicine

## 2019-08-12 ENCOUNTER — Other Ambulatory Visit: Payer: Self-pay | Admitting: Family Medicine

## 2019-08-23 ENCOUNTER — Telehealth: Payer: Self-pay | Admitting: Family Medicine

## 2019-08-23 NOTE — Telephone Encounter (Signed)
Patient called stating she would like to know how every so often she should be getting tb tests. Please follow up

## 2019-08-27 NOTE — Telephone Encounter (Signed)
Patient was called and informed that she has not had a tb skin test since 2017. Patient states that she will make an appointment to get TB skin test.

## 2019-08-27 NOTE — Telephone Encounter (Signed)
New Message  Pt calling to speak with someone regarding her TB concerns she called about on 10/2. Please f/u

## 2019-08-28 ENCOUNTER — Other Ambulatory Visit: Payer: Self-pay

## 2019-08-28 ENCOUNTER — Ambulatory Visit: Payer: Self-pay | Attending: Family Medicine | Admitting: *Deleted

## 2019-08-28 DIAGNOSIS — Z111 Encounter for screening for respiratory tuberculosis: Secondary | ICD-10-CM

## 2019-08-30 LAB — TB SKIN TEST
Induration: 16 mm
TB Skin Test: POSITIVE

## 2019-08-30 NOTE — Progress Notes (Signed)
PPD Reading Note PPD read and results entered in [facility]. Result: 16 mm induration Interpretation: redness with a 16 mm induration.  Patient will go to lab for blood work.

## 2019-09-03 LAB — QUANTIFERON-TB GOLD PLUS
QuantiFERON Mitogen Value: 1.38 IU/mL
QuantiFERON Nil Value: 0.02 IU/mL
QuantiFERON TB1 Ag Value: 0.03 IU/mL
QuantiFERON TB2 Ag Value: 0.01 IU/mL
QuantiFERON-TB Gold Plus: NEGATIVE

## 2019-09-12 ENCOUNTER — Telehealth: Payer: Self-pay | Admitting: Family Medicine

## 2019-09-12 NOTE — Telephone Encounter (Signed)
Patient aware of results.  Scheduled an appt for a flu shot.

## 2019-09-12 NOTE — Telephone Encounter (Signed)
Patient called for TB results

## 2019-09-16 ENCOUNTER — Ambulatory Visit: Payer: Self-pay | Admitting: Pharmacist

## 2019-09-24 ENCOUNTER — Ambulatory Visit: Payer: Self-pay | Admitting: Family Medicine

## 2019-09-26 ENCOUNTER — Ambulatory Visit: Payer: Self-pay | Attending: Family Medicine | Admitting: Pharmacist

## 2019-09-26 ENCOUNTER — Other Ambulatory Visit: Payer: Self-pay

## 2019-09-26 DIAGNOSIS — Z23 Encounter for immunization: Secondary | ICD-10-CM

## 2019-09-26 NOTE — Progress Notes (Signed)
Patient presents for vaccination against influenza per orders of Dr. Newlin. Consent given. Counseling provided. No contraindications exists. Vaccine administered without incident.   

## 2019-10-23 ENCOUNTER — Encounter: Payer: Self-pay | Admitting: Family Medicine

## 2019-10-23 ENCOUNTER — Other Ambulatory Visit: Payer: Self-pay

## 2019-10-23 ENCOUNTER — Ambulatory Visit: Payer: Self-pay | Attending: Family Medicine | Admitting: Family Medicine

## 2019-10-23 VITALS — BP 111/71 | HR 79 | Temp 98.3°F | Ht 66.0 in | Wt 190.0 lb

## 2019-10-23 DIAGNOSIS — Z809 Family history of malignant neoplasm, unspecified: Secondary | ICD-10-CM

## 2019-10-23 DIAGNOSIS — N941 Unspecified dyspareunia: Secondary | ICD-10-CM

## 2019-10-23 DIAGNOSIS — Z124 Encounter for screening for malignant neoplasm of cervix: Secondary | ICD-10-CM

## 2019-10-23 DIAGNOSIS — Z0000 Encounter for general adult medical examination without abnormal findings: Secondary | ICD-10-CM

## 2019-10-23 NOTE — Progress Notes (Signed)
Patient states that she is having pain during sex.

## 2019-10-23 NOTE — Progress Notes (Signed)
Subjective:  Patient ID: Katelyn Hardin, female    DOB: 1971/01/07  Age: 48 y.o. MRN: [id number]  CC: Gynecologic Exam   HPI Katelyn Hardin is a 48 year old female with a history of hypertension, hyperparathyroidism, vertigo, sciatica, hyperparathyroidism status post parathyroidectomy in 04/2019 who presents today for a complete physical exam. She has a positive family history of cancer: Mom - cervix ca; aunts on mom's side with cancer Sister - breast ca diagnosed at 57 yrs.   She complains of vaginal dryness and pain during intercourse and has used lubricants with no improvement.  Past Medical History:  Diagnosis Date  . Anemia   . Bacterial vaginosis   . Complication of anesthesia    Slow to wake up  . Fibroids   . History of hiatal hernia   . History of kidney stones   . Hypertension   . IBS (irritable bowel syndrome)     Past Surgical History:  Procedure Laterality Date  . PARATHYROIDECTOMY N/A 05/09/2019   Procedure: PARATHYROIDECTOMY;  Surgeon: Armandina Gemma, MD;  Location: [facility];  Service: General;  Laterality: N/A;  . TUBAL LIGATION    . TUBAL LIGATION      Family History  Problem Relation Age of Onset  . Thyroid disease Sister   . Cancer Maternal Aunt        breast  . Diabetes Neg Hx     Allergies  Allergen Reactions  . Acetaminophen Other (See Comments)    Jittery and upset stomach  . Aspirin Nausea And Vomiting  . Codeine     Jittery and upset stomach  . Oxycodone-Acetaminophen     Jittery and upset stomach  . Vicodin [Hydrocodone-Acetaminophen]     Jittery and upset stomach    Outpatient Medications Prior to Visit  Medication Sig Dispense Refill  . calcium gluconate 500 MG tablet Take 1 tablet (500 mg total) by mouth 2 (two) times daily. 60 tablet 2  . cetirizine (ZYRTEC) 10 MG tablet Take 1 tablet (10 mg total) by mouth daily. (Patient taking differently: Take 10 mg by mouth daily as needed for allergies. ) 30 tablet 3  .  cloNIDine (CATAPRES) 0.1 MG tablet Take 1 tablet (0.1 mg total) by mouth daily. For hot flashes 90 tablet 1  . clotrimazole-betamethasone (LOTRISONE) cream Apply 1 application topically 2 (two) times daily as needed (rash). 45 g 1  . fluticasone (FLONASE) 50 MCG/ACT nasal spray Place 1 spray into both nostrils daily. (Patient taking differently: Place 1 spray into both nostrils daily as needed for allergies. ) 16 g 2  . lisinopril-hydrochlorothiazide (ZESTORETIC) 20-25 MG tablet Take 1 tablet by mouth daily. 90 tablet 1  . meclizine (ANTIVERT) 25 MG tablet Take 1 tablet (25 mg total) by mouth 3 (three) times daily as needed for dizziness. 60 tablet 3  . methocarbamol (ROBAXIN) 500 MG tablet Take 1 tablet (500 mg total) 3 (three) times daily by mouth. X 10 days then prn (Patient taking differently: Take 500 mg by mouth every 8 (eight) hours as needed for muscle spasms. ) 120 tablet 0  . Multiple Vitamin (MULTIVITAMIN WITH MINERALS) TABS tablet Take 1 tablet by mouth daily.    . polyethylene glycol powder (GLYCOLAX/MIRALAX) 17 GM/SCOOP powder Take 17 g by mouth 2 (two) times daily as needed. 3350 g 1  . Probiotic Product (PROBIOTIC PO) Take 1 capsule by mouth daily.    . ergocalciferol (DRISDOL) 1.25 MG (50000 UT) capsule Take 1 capsule (50,000 Units total) by mouth  once a week. (Patient not taking: Reported on 10/23/2019) 9 capsule 1  . gabapentin (NEURONTIN) 300 MG capsule Take 1 capsule (300 mg total) by mouth 3 (three) times daily. (Patient not taking: Reported on 10/23/2019) 90 capsule 1  . traMADol (ULTRAM) 50 MG tablet Take 1-2 tablets (50-100 mg total) by mouth every 6 (six) hours as needed. (Patient not taking: Reported on 07/09/2019) 15 tablet 0   No facility-administered medications prior to visit.      ROS Review of Systems  Constitutional: Negative for activity change, appetite change and fatigue.  HENT: Negative for congestion, sinus pressure and sore throat.   Eyes: Negative for visual  disturbance.  Respiratory: Negative for cough, chest tightness, shortness of breath and wheezing.   Cardiovascular: Negative for chest pain and palpitations.  Gastrointestinal: Negative for abdominal distention, abdominal pain and constipation.  Endocrine: Negative for polydipsia.  Genitourinary: Negative for dysuria and frequency.  Musculoskeletal: Negative for arthralgias and back pain.  Skin: Negative for rash.  Neurological: Negative for tremors, light-headedness and numbness.  Hematological: Does not bruise/bleed easily.  Psychiatric/Behavioral: Negative for agitation and behavioral problems.    Objective:  BP 111/71   Pulse 79   Temp 98.3 F (36.8 C) (Oral)   Ht '5\' 6"'  (1.676 m)   Wt 190 lb (86.2 kg)   LMP 11/19/2016   SpO2 99%   BMI 30.67 kg/m   BP/Weight 10/23/2019 07/16/2019 0/86/7619  Systolic BP 509 326 712  Diastolic BP 71 76 80  Wt. (Lbs) 190 - -  BMI 30.67 - -      Physical Exam Constitutional:      General: She is not in acute distress.    Appearance: She is well-developed. She is not diaphoretic.  HENT:     Head: Normocephalic.     Right Ear: External ear normal.     Left Ear: External ear normal.     Nose: Nose normal.  Eyes:     Conjunctiva/sclera: Conjunctivae normal.     Pupils: Pupils are equal, round, and reactive to light.  Neck:     Musculoskeletal: Normal range of motion.     Vascular: No JVD.  Cardiovascular:     Rate and Rhythm: Normal rate and regular rhythm.     Heart sounds: Normal heart sounds. No murmur. No gallop.   Pulmonary:     Effort: Pulmonary effort is normal. No respiratory distress.     Breath sounds: Normal breath sounds. No wheezing or rales.  Chest:     Chest wall: No tenderness.     Breasts:        Right: No swelling or mass.        Left: No swelling or mass.  Abdominal:     General: Bowel sounds are normal. There is no distension.     Palpations: Abdomen is soft. There is no mass.     Tenderness: There is no  abdominal tenderness.  Genitourinary:    Comments: External genitalia, vagina, cervix-normal Musculoskeletal: Normal range of motion.        General: No tenderness.  Skin:    General: Skin is warm and dry.  Neurological:     Mental Status: She is alert and oriented to person, place, and time.     Deep Tendon Reflexes: Reflexes are normal and symmetric.     CMP Latest Ref Rng & Units 07/09/2019 05/13/2019 05/12/2019  Glucose 65 - 99 mg/dL 95 96 112(H)  BUN 6 - 24 mg/dL 13 12  14  Creatinine 0.57 - 1.00 mg/dL 0.92 0.80 0.90  Sodium 134 - 144 mmol/L 142 143 143  Potassium 3.5 - 5.2 mmol/L 3.7 3.2(L) 2.9(L)  Chloride 96 - 106 mmol/L 101 107 107  CO2 20 - 29 mmol/L 25 - 27  Calcium 8.7 - 10.2 mg/dL 8.2(L) - 7.8(L)  Total Protein 6.0 - 8.5 g/dL 7.3 - -  Total Bilirubin 0.0 - 1.2 mg/dL 0.2 - -  Alkaline Phos 39 - 117 IU/L 97 - -  AST 0 - 40 IU/L 23 - -  ALT 0 - 32 IU/L 23 - -    Lipid Panel     Component Value Date/Time   CHOL 200 (H) 03/22/2018 1148   TRIG 115 03/22/2018 1148   HDL 42 03/22/2018 1148   CHOLHDL 4.8 (H) 03/22/2018 1148   CHOLHDL 3.6 04/04/2013 1022   VLDL 20 04/04/2013 1022   LDLCALC 135 (H) 03/22/2018 1148    CBC    Component Value Date/Time   WBC 4.9 07/09/2019 1427   WBC 8.2 05/12/2019 2110   RBC 3.91 07/09/2019 1427   RBC 3.44 (L) 05/12/2019 2110   HGB 12.0 07/09/2019 1427   HCT 37.3 07/09/2019 1427   PLT 373 07/09/2019 1427   MCV 95 07/09/2019 1427   MCH 30.7 07/09/2019 1427   MCH 31.7 05/12/2019 2110   MCHC 32.2 07/09/2019 1427   MCHC 32.2 05/12/2019 2110   RDW 12.8 07/09/2019 1427   LYMPHSABS 2.1 07/09/2019 1427   MONOABS 0.5 02/07/2014 1711   EOSABS 0.0 07/09/2019 1427   BASOSABS 0.0 07/09/2019 1427    Lab Results  Component Value Date   HGBA1C 5.10 12/04/2014    Assessment & Plan:   1. Annual physical exam Counseled on 150 minutes of exercise per week, healthy eating (including decreased daily intake of saturated fats, cholesterol,  added sugars, sodium), STI prevention, routine healthcare maintenance. - Complete Metabolic Panel with GFR - Lipid panel - VITAMIN D 25 Hydroxy (Vit-D Deficiency, Fractures)  2. Screening for cervical cancer - Cytology - PAP([place]) - Cervicovaginal ancillary only  3. Dyspareunia, female - Ambulatory referral to Gynecology  4. Family history of cancer Given multiple family history of breast and cervical cancer will refer to genetics for screening for BRCA genes - Ambulatory referral to Genetics    Charlott Rakes, MD, FAAFP. [facility] [place] and [place], [place]   10/23/2019, 10:41 AM

## 2019-10-23 NOTE — Patient Instructions (Signed)
Health Maintenance, Female Adopting a healthy lifestyle and getting preventive care are important in promoting health and wellness. Ask your health care provider about:  The right schedule for you to have regular tests and exams.  Things you can do on your own to prevent diseases and keep yourself healthy. What should I know about diet, weight, and exercise? Eat a healthy diet   Eat a diet that includes plenty of vegetables, fruits, low-fat dairy products, and lean protein.  Do not eat a lot of foods that are high in solid fats, added sugars, or sodium. Maintain a healthy weight Body mass index (BMI) is used to identify weight problems. It estimates body fat based on height and weight. Your health care provider can help determine your BMI and help you achieve or maintain a healthy weight. Get regular exercise Get regular exercise. This is one of the most important things you can do for your health. Most adults should:  Exercise for at least 150 minutes each week. The exercise should increase your heart rate and make you sweat (moderate-intensity exercise).  Do strengthening exercises at least twice a week. This is in addition to the moderate-intensity exercise.  Spend less time sitting. Even light physical activity can be beneficial. Watch cholesterol and blood lipids Have your blood tested for lipids and cholesterol at 49 years of age, then have this test every 5 years. Have your cholesterol levels checked more often if:  Your lipid or cholesterol levels are high.  You are older than 49 years of age.  You are at high risk for heart disease. What should I know about cancer screening? Depending on your health history and family history, you may need to have cancer screening at various ages. This may include screening for:  Breast cancer.  Cervical cancer.  Colorectal cancer.  Skin cancer.  Lung cancer. What should I know about heart disease, diabetes, and high blood  pressure? Blood pressure and heart disease  High blood pressure causes heart disease and increases the risk of stroke. This is more likely to develop in people who have high blood pressure readings, are of African descent, or are overweight.  Have your blood pressure checked: ? Every 3-5 years if you are 18-39 years of age. ? Every year if you are 40 years old or older. Diabetes Have regular diabetes screenings. This checks your fasting blood sugar level. Have the screening done:  Once every three years after age 40 if you are at a normal weight and have a low risk for diabetes.  More often and at a younger age if you are overweight or have a high risk for diabetes. What should I know about preventing infection? Hepatitis B If you have a higher risk for hepatitis B, you should be screened for this virus. Talk with your health care provider to find out if you are at risk for hepatitis B infection. Hepatitis C Testing is recommended for:  Everyone born from 1945 through 1965.  Anyone with known risk factors for hepatitis C. Sexually transmitted infections (STIs)  Get screened for STIs, including gonorrhea and chlamydia, if: ? You are sexually active and are younger than 49 years of age. ? You are older than 49 years of age and your health care provider tells you that you are at risk for this type of infection. ? Your sexual activity has changed since you were last screened, and you are at increased risk for chlamydia or gonorrhea. Ask your health care provider if   you are at risk.  Ask your health care provider about whether you are at high risk for HIV. Your health care provider may recommend a prescription medicine to help prevent HIV infection. If you choose to take medicine to prevent HIV, you should first get tested for HIV. You should then be tested every 3 months for as long as you are taking the medicine. Pregnancy  If you are about to stop having your period (premenopausal) and  you may become pregnant, seek counseling before you get pregnant.  Take 400 to 800 micrograms (mcg) of folic acid every day if you become pregnant.  Ask for birth control (contraception) if you want to prevent pregnancy. Osteoporosis and menopause Osteoporosis is a disease in which the bones lose minerals and strength with aging. This can result in bone fractures. If you are 65 years old or older, or if you are at risk for osteoporosis and fractures, ask your health care provider if you should:  Be screened for bone loss.  Take a calcium or vitamin D supplement to lower your risk of fractures.  Be given hormone replacement therapy (HRT) to treat symptoms of menopause. Follow these instructions at home: Lifestyle  Do not use any products that contain nicotine or tobacco, such as cigarettes, e-cigarettes, and chewing tobacco. If you need help quitting, ask your health care provider.  Do not use street drugs.  Do not share needles.  Ask your health care provider for help if you need support or information about quitting drugs. Alcohol use  Do not drink alcohol if: ? Your health care provider tells you not to drink. ? You are pregnant, may be pregnant, or are planning to become pregnant.  If you drink alcohol: ? Limit how much you use to 0-1 drink a day. ? Limit intake if you are breastfeeding.  Be aware of how much alcohol is in your drink. In the [place] one drink equals one 12 oz bottle of beer (355 mL), one 5 oz glass of wine (148 mL), or one 1 oz glass of hard liquor (44 mL). General instructions  Schedule regular health, dental, and eye exams.  Stay current with your vaccines.  Tell your health care provider if: ? You often feel depressed. ? You have ever been abused or do not feel safe at home. Summary  Adopting a healthy lifestyle and getting preventive care are important in promoting health and wellness.  Follow your health care provider's instructions about healthy  diet, exercising, and getting tested or screened for diseases.  Follow your health care provider's instructions on monitoring your cholesterol and blood pressure. This information is not intended to replace advice given to you by your health care provider. Make sure you discuss any questions you have with your health care provider. Document Released: 05/23/2011 Document Revised: 10/31/2018 Document Reviewed: 10/31/2018 Elsevier Patient Education  2020 Elsevier Inc.  

## 2019-10-24 ENCOUNTER — Telehealth: Payer: Self-pay | Admitting: Genetic Counselor

## 2019-10-24 LAB — CMP14+EGFR
ALT: 30 IU/L (ref 0–32)
AST: 28 IU/L (ref 0–40)
Albumin/Globulin Ratio: 1.5 (ref 1.2–2.2)
Albumin: 4.4 g/dL (ref 3.8–4.8)
Alkaline Phosphatase: 99 IU/L (ref 39–117)
BUN/Creatinine Ratio: 15 (ref 9–23)
BUN: 12 mg/dL (ref 6–24)
Bilirubin Total: 0.4 mg/dL (ref 0.0–1.2)
CO2: 27 mmol/L (ref 20–29)
Calcium: 9.5 mg/dL (ref 8.7–10.2)
Chloride: 103 mmol/L (ref 96–106)
Creatinine, Ser: 0.81 mg/dL (ref 0.57–1.00)
GFR calc Af Amer: 99 mL/min/{1.73_m2} (ref >59)
GFR calc non Af Amer: 86 mL/min/{1.73_m2} (ref >59)
Globulin, Total: 2.9 g/dL (ref 1.5–4.5)
Glucose: 90 mg/dL (ref 65–99)
Potassium: 3.8 mmol/L (ref 3.5–5.2)
Sodium: 142 mmol/L (ref 134–144)
Total Protein: 7.3 g/dL (ref 6.0–8.5)

## 2019-10-24 LAB — LIPID PANEL
Chol/HDL Ratio: 4.4 ratio (ref 0.0–4.4)
Cholesterol, Total: 183 mg/dL (ref 100–199)
HDL: 42 mg/dL (ref >39)
LDL Chol Calc (NIH): 121 mg/dL — ABNORMAL HIGH (ref 0–99)
Triglycerides: 112 mg/dL (ref 0–149)
VLDL Cholesterol Cal: 20 mg/dL (ref 5–40)

## 2019-10-24 LAB — CYTOLOGY - PAP
Comment: NEGATIVE
Diagnosis: NEGATIVE
High risk HPV: NEGATIVE

## 2019-10-24 LAB — VITAMIN D 25 HYDROXY (VIT D DEFICIENCY, FRACTURES): Vit D, 25-Hydroxy: 33.8 ng/mL (ref 30.0–100.0)

## 2019-10-24 NOTE — Telephone Encounter (Signed)
A genetic counseling appt has been scheduled for Katelyn Hardin to see Santiago Glad via video visit on 12/9 at 10am. Pt has been made aware a link will be sent to her to access the appt. Pt voiced understanding.

## 2019-10-29 ENCOUNTER — Other Ambulatory Visit: Payer: Self-pay | Admitting: Family Medicine

## 2019-10-29 ENCOUNTER — Telehealth: Payer: Self-pay | Admitting: Genetic Counselor

## 2019-10-29 ENCOUNTER — Encounter: Payer: Self-pay | Admitting: Genetic Counselor

## 2019-10-29 LAB — CERVICOVAGINAL ANCILLARY ONLY
Bacterial Vaginitis (gardnerella): POSITIVE — AB
Candida Glabrata: NEGATIVE
Candida Vaginitis: NEGATIVE
Chlamydia: NEGATIVE
Comment: NEGATIVE
Comment: NEGATIVE
Comment: NEGATIVE
Comment: NEGATIVE
Comment: NEGATIVE
Comment: NORMAL
Neisseria Gonorrhea: NEGATIVE
Trichomonas: NEGATIVE

## 2019-10-29 MED ORDER — METRONIDAZOLE 0.75 % VA GEL: 1.0000 | Freq: Every day | VAGINAL | 0 refills | Status: DC

## 2019-10-29 NOTE — Telephone Encounter (Signed)
Contacted patient to verify mychart visit for pre reg 

## 2019-10-30 ENCOUNTER — Encounter: Payer: Self-pay | Admitting: Genetic Counselor

## 2019-10-30 ENCOUNTER — Inpatient Hospital Stay: Payer: Self-pay | Attending: Genetic Counselor | Admitting: Genetic Counselor

## 2019-10-30 DIAGNOSIS — E210 Primary hyperparathyroidism: Secondary | ICD-10-CM

## 2019-10-30 DIAGNOSIS — Z803 Family history of malignant neoplasm of breast: Secondary | ICD-10-CM

## 2019-10-30 DIAGNOSIS — E213 Hyperparathyroidism, unspecified: Secondary | ICD-10-CM

## 2019-10-30 NOTE — Progress Notes (Signed)
REFERRING PROVIDER: Charlott Rakes, MD [place],  [place] [address]  PRIMARY PROVIDER:  Charlott Rakes, MD  PRIMARY REASON FOR VISIT:  1. Primary hyperparathyroidism ([place])   2. Hyperparathyroidism ([place])   3. Family history of breast cancer      HISTORY OF PRESENT ILLNESS:  I connected with  [name] on 10/30/2019 at 10 AM EDT by MyChart video conference and verified that I am speaking with the correct person using two identifiers.   Patient location: Home Provider location: [facility]  [name], a 48 y.o. female, was seen for a [facility] cancer genetics consultation at the request of Dr. Margarita Hardin due to a family history of breast cancer.  [name] presents to clinic today to discuss the possibility of a hereditary predisposition to cancer, genetic testing, and to further clarify her future cancer risks, as well as potential cancer risks for family members.   [name] is a 48 y.o. female with no personal history of cancer.  In 2011 it was noted that she had elevated calcium levels.  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  Sugery found one adenoma in one lobe of the parathyroid.  CANCER HISTORY:  Oncology History   No history exists.     RISK FACTORS:  Menarche was at age 57-13.  First live birth at age 48.  Ovaries intact: yes.  Hysterectomy: no.  Menopausal status: postmenopausal.  HRT use: 0 years. Colonoscopy: yes; one polyp. Mammogram within the last year: yes. Number of breast biopsies: 0. Up to date with pelvic exams: yes. Any excessive radiation exposure in the past: no  Past Medical History:  Diagnosis Date  . Anemia   . Bacterial vaginosis   . Complication of anesthesia    Slow to wake up  . Family history of breast cancer   . Fibroids   . History of hiatal hernia   . History of kidney stones   . Hyperparathyroidism ([place])   . Hypertension   . IBS (irritable bowel syndrome)     Past  Surgical History:  Procedure Laterality Date  . PARATHYROIDECTOMY N/A 05/09/2019   Procedure: PARATHYROIDECTOMY;  Surgeon: Armandina Gemma, MD;  Location: [facility];  Service: General;  Laterality: N/A;  . TUBAL LIGATION    . TUBAL LIGATION      Social History   Socioeconomic History  . Marital status: Married    Spouse name: Not on file  . Number of children: Not on file  . Years of education: Not on file  . Highest education level: Not on file  Occupational History  . Not on file  Social Needs  . Financial resource strain: Not on file  . Food insecurity    Worry: Not on file    Inability: Not on file  . Transportation needs    Medical: Not on file    Non-medical: Not on file  Tobacco Use  . Smoking status: Never Smoker  . Smokeless tobacco: Never Used  Substance and Sexual Activity  . Alcohol use: Yes    Alcohol/week: 6.0 standard drinks    Types: 2 Glasses of wine, 4 Standard drinks or equivalent per week    Comment: 2 glasses of wine daily  . Drug use: No  . Sexual activity: Yes    Birth control/protection: Post-menopausal  Lifestyle  . Physical activity    Days per week: Not on file    Minutes per session: Not on file  . Stress: Not  on file  Relationships  . Social Herbalist on phone: Not on file    Gets together: Not on file    Attends religious service: Not on file    Active member of club or organization: Not on file    Attends meetings of clubs or organizations: Not on file    Relationship status: Not on file  Other Topics Concern  . Not on file  Social History Narrative  . Not on file     FAMILY HISTORY:  We obtained a detailed, 4-generation family history.  Significant diagnoses are listed below: Family History  Problem Relation Age of Onset  . Cervical cancer Mother   . Thyroid disease Sister   . Breast cancer Sister 2  . Cancer Maternal Grandfather        unknown  . Breast cancer Other        dx in her 71s; MGM's sister  . Breast  cancer Other        5 of MGM sisters  . Diabetes Neg Hx     The patient has two daughters who are cancer free.  She has two maternal half sisters, one who had breast cancer at age 35 and had reportedly negative genetic testing. Her mother has had cervical cancer and her father is deceased.  The patient's mother had cervical cancer in her 74's.  She had eight brothers and a sister who are reportedly cancer free.  One uncle has a son who had a cancer that was in his neck region.  The maternal grandparents are deceased. The grandfather had cancer, and the grandmother had six sisters with breast cancer.  The patient's father is deceased due to murder. He had many siblings, but the patient is not in contact with his family.  [name] is unaware of previous family history of genetic testing for hereditary cancer risks. Patient's maternal ancestors are of African American descent, and paternal ancestors are of African American descent. There is no reported Ashkenazi Jewish ancestry. There is no known consanguinity.   GENETIC COUNSELING ASSESSMENT: [name] is a 48 y.o. female with a family history of cancer and a personal history of primary hyperparathyroidism which is somewhat suggestive of a hereditary cancer syndrome and predisposition to cancer given the many family members with breast cancer some at young ages. We, therefore, discussed and recommended the following at today's visit.   DISCUSSION: We discussed that 5 - 10% of breast cancer is hereditary, with most cases associated with BRCA mutations.  There are other genes that can be associated with hereditary breast cancer syndromes.  These include ATM, CHEK2 and PALB2.  We discussed that testing is beneficial for several reasons including knowing how to follow individuals after completing their treatment and understand if other family members could be at risk for cancer and allow them to undergo genetic testing.   We discussed that most  cases of PHPT are sporadic.  Features of hereditary PHPT include multi-focal nodules in multiple nodes, tumors in other endocrine glands such as the pituitary or adrenal gland.  [name] PHPT was not multi-focal, and therefore falls more likely into the sporadic category.  However, she had symptoms of PHPT since 2011 when she was 52 and therefore was relatively young at the onset.  We can add on the MEN1 genes and other endocrinology genes to ensure that we are not missing anything.  We reviewed the characteristics, features and inheritance patterns of hereditary cancer syndromes. We  also discussed genetic testing, including the appropriate family members to test, the process of testing, insurance coverage and turn-around-time for results. We discussed the implications of a negative, positive, carrier and/or variant of uncertain significant result. We recommended [name] pursue genetic testing for the multi-cancer gene panel. The Multi-Gene Panel offered by [facility] includes sequencing and/or deletion duplication testing of the following 85 genes: AIP, ALK, APC, ATM, AXIN2,BAP1,  BARD1, BLM, BMPR1A, BRCA1, BRCA2, BRIP1, CASR, CDC73, CDH1, CDK4, CDKN1B, CDKN1C, CDKN2A (p14ARF), CDKN2A (p16INK4a), CEBPA, CHEK2, CTNNA1, DICER1, DIS3L2, EGFR (c.2369C>T, p.Thr790Met variant only), EPCAM (Deletion/duplication testing only), FH, FLCN, GATA2, GPC3, GREM1 (Promoter region deletion/duplication testing only), HOXB13 (c.251G>A, p.Gly84Glu), HRAS, KIT, MAX, MEN1, MET, MITF (c.952G>A, p.Glu318Lys variant only), MLH1, MSH2, MSH3, MSH6, MUTYH, NBN, NF1, NF2, NTHL1, PALB2, PDGFRA, PHOX2B, PMS2, POLD1, POLE, POT1, PRKAR1A, PTCH1, PTEN, RAD50, RAD51C, RAD51D, RB1, RECQL4, RET, RNF43, RUNX1, SDHAF2, SDHA (sequence changes only), SDHB, SDHC, SDHD, SMAD4, SMARCA4, SMARCB1, SMARCE1, STK11, SUFU, TERC, TERT, TMEM127, TP53, TSC1, TSC2, VHL, WRN and WT1.    Based on [name] family history of cancer, she meets medical  criteria for genetic testing. Despite that she meets criteria, she may still have an out of pocket cost. We discussed that if her out of pocket cost for testing is over $100, the laboratory will call and confirm whether she wants to proceed with testing.  If the out of pocket cost of testing is less than $100 she will be billed by the genetic testing laboratory.   PLAN: After considering the risks, benefits, and limitations, [name] provided informed consent to pursue genetic testing.  A saliva kit will be mailed to [name] and she will complete this and send [facility] to [facility] for analysis of the multi-cancer gene panel. Results should be available within approximately 2-3 weeks' time, at which point they will be disclosed by telephone to [name], as will any additional recommendations warranted by these results. [name] will receive a summary of her genetic counseling visit and a copy of her results once available. This information will also be available in Epic.   Lastly, we encouraged [name] to remain in contact with cancer genetics annually so that we can continuously update the family history and inform her of any changes in cancer genetics and testing that may be of benefit for this family.   [name] questions were answered to her satisfaction today. Our contact information was provided should additional questions or concerns arise. Thank you for the referral and allowing [place] to share in the care of your patient.   Kiyla Ringler P. [place], [place], [facility] Licensed, Insurance risk surveyor Santiago [email or web address] phone: [phone]  The patient was seen for a total of 45 minutes in face-to-face genetic counseling.  This patient was discussed with Drs. Magrinat, Lindi Adie and/or Burr Medico who agrees with the above.    _______________________________________________________________________ For Office Staff:  Number of people involved in session: 1 Was an Intern/  student involved with case: no

## 2019-11-29 ENCOUNTER — Encounter: Payer: Self-pay | Admitting: Family Medicine

## 2019-12-03 ENCOUNTER — Telehealth: Payer: Self-pay | Admitting: Genetic Counselor

## 2019-12-03 ENCOUNTER — Encounter: Payer: Self-pay | Admitting: Genetic Counselor

## 2019-12-03 DIAGNOSIS — Z1379 Encounter for other screening for genetic and chromosomal anomalies: Secondary | ICD-10-CM | POA: Insufficient documentation

## 2019-12-03 NOTE — Telephone Encounter (Signed)
LM on VM that results are back and to please call.  Left CB number. 

## 2019-12-04 ENCOUNTER — Ambulatory Visit: Payer: Self-pay | Admitting: Genetic Counselor

## 2019-12-04 DIAGNOSIS — Z1379 Encounter for other screening for genetic and chromosomal anomalies: Secondary | ICD-10-CM

## 2019-12-04 NOTE — Telephone Encounter (Signed)
Revealed negative genetic testing.  Discussed that we do not know why there is cancer in the family. It could be due to a different gene that we are not testing, or maybe our current technology may not be able to pick something up.  It will be important for her to keep in contact with genetics to keep up with whether additional testing may be needed.  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  These will not change medical management.

## 2019-12-04 NOTE — Progress Notes (Signed)
HPI:  Ms. Mare Ferrari was previously seen in the [facility] due to a family history of cancer and concerns regarding a hereditary predisposition to cancer. Please refer to our prior cancer genetics clinic note for more information regarding our discussion, assessment and recommendations, at the time. Ms. Peterson Lombard recent genetic test results were disclosed to her, as were recommendations warranted by these results. These results and recommendations are discussed in more detail below.  CANCER HISTORY:  Oncology History   No history exists.    FAMILY HISTORY:  We obtained a detailed, 4-generation family history.  Significant diagnoses are listed below: Family History  Problem Relation Age of Onset  . Cervical cancer Mother   . Thyroid disease Sister   . Breast cancer Sister 66  . Cancer Maternal Grandfather        unknown  . Breast cancer Other        dx in her 71s; MGM's sister  . Breast cancer Other        5 of MGM sisters  . Diabetes Neg Hx     The patient has two daughters who are cancer free.  She has two maternal half sisters, one who had breast cancer at age 73 and had reportedly negative genetic testing. Her mother has had cervical cancer and her father is deceased.  The patient's mother had cervical cancer in her 45's.  She had eight brothers and a sister who are reportedly cancer free.  One uncle has a son who had a cancer that was in his neck region.  The maternal grandparents are deceased. The grandfather had cancer, and the grandmother had six sisters with breast cancer.  The patient's father is deceased due to murder. He had many siblings, but the patient is not in contact with his family.  Ms. Mare Ferrari is unaware of previous family history of genetic testing for hereditary cancer risks. Patient's maternal ancestors are of African American descent, and paternal ancestors are of African American descent. There is no reported Ashkenazi Jewish ancestry.  There is no known consanguinity.    GENETIC TEST RESULTS: Genetic testing reported out on December 01, 2019 through the multi-cancer panel found no pathogenic mutations. The Multi-Gene Panel offered by [facility] includes sequencing and/or deletion duplication testing of the following 85 genes: AIP, ALK, APC, ATM, AXIN2,BAP1,  BARD1, BLM, BMPR1A, BRCA1, BRCA2, BRIP1, CASR, CDC73, CDH1, CDK4, CDKN1B, CDKN1C, CDKN2A (p14ARF), CDKN2A (p16INK4a), CEBPA, CHEK2, CTNNA1, DICER1, DIS3L2, EGFR (c.2369C>T, p.Thr790Met variant only), EPCAM (Deletion/duplication testing only), FH, FLCN, GATA2, GPC3, GREM1 (Promoter region deletion/duplication testing only), HOXB13 (c.251G>A, p.Gly84Glu), HRAS, KIT, MAX, MEN1, MET, MITF (c.952G>A, p.Glu318Lys variant only), MLH1, MSH2, MSH3, MSH6, MUTYH, NBN, NF1, NF2, NTHL1, PALB2, PDGFRA, PHOX2B, PMS2, POLD1, POLE, POT1, PRKAR1A, PTCH1, PTEN, RAD50, RAD51C, RAD51D, RB1, RECQL4, RET, RNF43, RUNX1, SDHAF2, SDHA (sequence changes only), SDHB, SDHC, SDHD, SMAD4, SMARCA4, SMARCB1, SMARCE1, STK11, SUFU, TERC, TERT, TMEM127, TP53, TSC1, TSC2, VHL, WRN and WT1.. The test report has been scanned into EPIC and is located under the Molecular Pathology section of the Results Review tab.  A portion of the result report is included below for reference.     We discussed with Ms. Mare Ferrari that because current genetic testing is not perfect, it is possible there may be a gene mutation in one of these genes that current testing cannot detect, but that chance is small.  We also discussed, that there could be another gene that has not yet been discovered, or that we have not  yet tested, that is responsible for the cancer diagnoses in the family. It is also possible there is a hereditary cause for the cancer in the family that Ms. Mare Ferrari did not inherit and therefore was not identified in her testing.  Therefore, it is important to remain in touch with cancer genetics in the future so that we can continue to  offer Ms. Mare Ferrari the most up to date genetic testing.   Genetic testing did identify two Variants of uncertain significance (VUS) - one in the BRCA2 gene called c.6125A>C and a second in the MSH2 gene called c.1087G>T.  At this time, it is unknown if these variants are associated with increased cancer risk or if they are normal findings, but most variants such as these get reclassified to being inconsequential. They should not be used to make medical management decisions. With time, we suspect the lab will determine the significance of these variants, if any. If we do learn more about them, we will try to contact Ms. Mare Ferrari to discuss it further. However, it is important to stay in touch with [place] periodically and keep the address and phone number up to date.  ADDITIONAL GENETIC TESTING: We discussed with Ms. Mare Ferrari that her genetic testing was fairly extensive.  If there are genes identified to increase cancer risk that can be analyzed in the future, we would be happy to discuss and coordinate this testing at that time.    CANCER SCREENING RECOMMENDATIONS: Ms. Peterson Lombard test result is considered negative (normal).  This means that we have not identified a hereditary cause for her family history of cancer at this time. Most cancers happen by chance and this negative test suggests that her cancer may fall into this category.    While reassuring, this does not definitively rule out a hereditary predisposition to cancer. It is still possible that there could be genetic mutations that are undetectable by current technology. There could be genetic mutations in genes that have not been tested or identified to increase cancer risk.  Therefore, it is recommended she continue to follow the cancer management and screening guidelines provided by her primary healthcare provider.   An individual's cancer risk and medical management are not determined by genetic test results alone. Overall cancer risk assessment  incorporates additional factors, including personal medical history, family history, and any available genetic information that may result in a personalized plan for cancer prevention and surveillance  RECOMMENDATIONS FOR FAMILY MEMBERS:  Individuals in this family might be at some increased risk of developing cancer, over the general population risk, simply due to the family history of cancer.  We recommended women in this family have a yearly mammogram beginning at age 65, or 34 years younger than the earliest onset of cancer, an annual clinical breast exam, and perform monthly breast self-exams. Women in this family should also have a gynecological exam as recommended by their primary provider. All family members should have a colonoscopy by age 28.  FOLLOW-UP: Lastly, we discussed with Ms. Mare Ferrari that cancer genetics is a rapidly advancing field and it is possible that new genetic tests will be appropriate for her and/or her family members in the future. We encouraged her to remain in contact with cancer genetics on an annual basis so we can update her personal and family histories and let her know of advances in cancer genetics that may benefit this family.   Our contact number was provided. Ms. Peterson Lombard questions were answered to her satisfaction, and she knows  she is welcome to call us at anytime with additional questions or concerns.   Roma Kayser, [place], [facility] Licensed, Certified Genetic Counselor [email or web address]

## 2019-12-05 ENCOUNTER — Ambulatory Visit: Payer: Self-pay | Admitting: Obstetrics & Gynecology

## 2020-01-22 ENCOUNTER — Other Ambulatory Visit: Payer: Self-pay | Admitting: Family Medicine

## 2020-01-22 ENCOUNTER — Encounter: Payer: Self-pay | Admitting: Family Medicine

## 2020-01-22 MED ORDER — LISINOPRIL-HYDROCHLOROTHIAZIDE 20-25 MG PO TABS: 1.0000 | ORAL_TABLET | Freq: Every day | ORAL | 1 refills | Status: DC

## 2020-04-02 ENCOUNTER — Encounter: Payer: Self-pay | Admitting: Family Medicine

## 2020-04-09 ENCOUNTER — Ambulatory Visit: Payer: Self-pay | Attending: Family Medicine | Admitting: Physician Assistant

## 2020-04-09 ENCOUNTER — Other Ambulatory Visit: Payer: Self-pay

## 2020-04-09 DIAGNOSIS — R3915 Urgency of urination: Secondary | ICD-10-CM

## 2020-04-09 DIAGNOSIS — Z9009 Acquired absence of other part of head and neck: Secondary | ICD-10-CM

## 2020-04-09 DIAGNOSIS — E559 Vitamin D deficiency, unspecified: Secondary | ICD-10-CM

## 2020-04-09 DIAGNOSIS — Z862 Personal history of diseases of the blood and blood-forming organs and certain disorders involving the immune mechanism: Secondary | ICD-10-CM

## 2020-04-09 DIAGNOSIS — R5383 Other fatigue: Secondary | ICD-10-CM

## 2020-04-09 DIAGNOSIS — F419 Anxiety disorder, unspecified: Secondary | ICD-10-CM

## 2020-04-09 DIAGNOSIS — E892 Postprocedural hypoparathyroidism: Secondary | ICD-10-CM

## 2020-04-09 LAB — POCT URINALYSIS DIP (CLINITEK)
Bilirubin, UA: NEGATIVE
Blood, UA: NEGATIVE
Glucose, UA: NEGATIVE mg/dL
Ketones, POC UA: NEGATIVE mg/dL
Nitrite, UA: NEGATIVE
POC PROTEIN,UA: NEGATIVE
Spec Grav, UA: >=1.03 — AB (ref 1.010–1.025)
Urobilinogen, UA: 1 E.U./dL
pH, UA: 6 (ref 5.0–8.0)

## 2020-04-09 MED ORDER — FLUCONAZOLE 150 MG PO TABS: 150.0000 mg | ORAL_TABLET | Freq: Once | ORAL | 0 refills | Status: AC

## 2020-04-09 MED ORDER — SULFAMETHOXAZOLE-TRIMETHOPRIM 800-160 MG PO TABS: 1.0000 | ORAL_TABLET | Freq: Two times a day (BID) | ORAL | 0 refills | Status: DC

## 2020-04-09 NOTE — Progress Notes (Signed)
Virtual Visit via Telephone Note  I connected with Katelyn Hardin on 04/09/20 at 10:10 AM EDT by telephone and verified that I am speaking with the correct person using two identifiers.   I discussed the limitations, risks, security and privacy concerns of performing an evaluation and management service by telephone and the availability of in person appointments. I also discussed with the patient that there may be a patient responsible charge related to this service. The patient expressed understanding and agreed to proceed.   PATIENT visit by telephone virtually in the context of Covid-19 pandemic. Patient location:  home My Location:  [place] office Persons on the call:  Me and the patient.    History of Present Illness:  Multiple issues.  She has had about a 1 week h/o urinary urgency and cloudy urine.    H/o parathyroid surgery and feeling very tired.  Wants to get labs checked.  Also has a h/o anemia and vitamin D deficiency.  Lately she has been feeling very anxious about driving.  She has never been on anxiety medications and isn't sure why she is feeling this way.  It mostly has to do with fear about highways.      Observations/Objective:  NAD.  A&Ox3   Assessment and Plan: 1. Urinary urgency - POCT URINALYSIS DIP (CLINITEK)-trace leukocytes - sulfamethoxazole-trimethoprim (BACTRIM DS) 800-160 MG tablet; Take 1 tablet by mouth 2 (two) times daily.  Dispense: 10 tablet; Refill: 0 - fluconazole (DIFLUCAN) 150 MG tablet; Take 1 tablet (150 mg total) by mouth once for 1 dose.  Dispense: 1 tablet; Refill: 0 - Urine Culture -drink 80-100 ounces water daily  2. H/O parathyroidectomy - PTH, Intact and Calcium - Comprehensive metabolic panel  3. Vitamin D deficiency - Vitamin D, 25-hydroxy - Thyroid Panel With TSH  4. H/O iron deficiency anemia - CBC with Differential/Platelet  5. Other fatigue - Thyroid Panel With TSH  6. Anxiety -she is interested in talking to  social work first then following up with PCP to see if this improves with some counseling and trouble shooting by social work(see above) - Thyroid Panel With TSH - Ambulatory referral to Social Work      Follow Up Instructions: See PCP in about 6 weeks   I discussed the assessment and treatment plan with the patient. The patient was provided an opportunity to ask questions and all were answered. The patient agreed with the plan and demonstrated an understanding of the instructions.   The patient was advised to call back or seek an in-person evaluation if the symptoms worsen or if the condition fails to improve as anticipated.  I provided 14 minutes of non-face-to-face time during this encounter.   Freeman Caldron, PA-C  Patient ID: Katelyn Hardin, female   DOB: [birth year], 49 y.o.   MRN: [id number]

## 2020-04-10 LAB — CBC WITH DIFFERENTIAL/PLATELET
Basophils Absolute: 0 10*3/uL (ref 0.0–0.2)
Basos: 0 %
EOS (ABSOLUTE): 0.1 10*3/uL (ref 0.0–0.4)
Eos: 1 %
Hematocrit: 38.5 % (ref 34.0–46.6)
Hemoglobin: 13 g/dL (ref 11.1–15.9)
Immature Grans (Abs): 0 10*3/uL (ref 0.0–0.1)
Immature Granulocytes: 0 %
Lymphocytes Absolute: 2.3 10*3/uL (ref 0.7–3.1)
Lymphs: 41 %
MCH: 31.4 pg (ref 26.6–33.0)
MCHC: 33.8 g/dL (ref 31.5–35.7)
MCV: 93 fL (ref 79–97)
Monocytes Absolute: 0.3 10*3/uL (ref 0.1–0.9)
Monocytes: 6 %
Neutrophils Absolute: 2.9 10*3/uL (ref 1.4–7.0)
Neutrophils: 52 %
Platelets: 345 10*3/uL (ref 150–450)
RBC: 4.14 x10E6/uL (ref 3.77–5.28)
RDW: 13.3 % (ref 11.7–15.4)
WBC: 5.7 10*3/uL (ref 3.4–10.8)

## 2020-04-10 LAB — COMPREHENSIVE METABOLIC PANEL
ALT: 17 IU/L (ref 0–32)
AST: 19 IU/L (ref 0–40)
Albumin/Globulin Ratio: 1.3 (ref 1.2–2.2)
Albumin: 4.3 g/dL (ref 3.8–4.8)
Alkaline Phosphatase: 85 IU/L (ref 48–121)
BUN/Creatinine Ratio: 18 (ref 9–23)
BUN: 17 mg/dL (ref 6–24)
Bilirubin Total: 0.3 mg/dL (ref 0.0–1.2)
CO2: 25 mmol/L (ref 20–29)
Calcium: 9.3 mg/dL (ref 8.7–10.2)
Chloride: 100 mmol/L (ref 96–106)
Creatinine, Ser: 0.94 mg/dL (ref 0.57–1.00)
GFR calc Af Amer: 82 mL/min/{1.73_m2} (ref >59)
GFR calc non Af Amer: 71 mL/min/{1.73_m2} (ref >59)
Globulin, Total: 3.2 g/dL (ref 1.5–4.5)
Glucose: 83 mg/dL (ref 65–99)
Potassium: 3.9 mmol/L (ref 3.5–5.2)
Sodium: 140 mmol/L (ref 134–144)
Total Protein: 7.5 g/dL (ref 6.0–8.5)

## 2020-04-10 LAB — VITAMIN D 25 HYDROXY (VIT D DEFICIENCY, FRACTURES): Vit D, 25-Hydroxy: 47.9 ng/mL (ref 30.0–100.0)

## 2020-04-10 LAB — PTH, INTACT AND CALCIUM: PTH: 40 pg/mL (ref 15–65)

## 2020-04-10 LAB — THYROID PANEL WITH TSH
Free Thyroxine Index: 1.8 (ref 1.2–4.9)
T3 Uptake Ratio: 27 % (ref 24–39)
T4, Total: 6.5 ug/dL (ref 4.5–12.0)
TSH: 0.958 u[IU]/mL (ref 0.450–4.500)

## 2020-04-11 LAB — URINE CULTURE

## 2020-04-13 ENCOUNTER — Telehealth: Payer: Self-pay | Admitting: Licensed Clinical Social Worker

## 2020-04-13 NOTE — Telephone Encounter (Signed)
Call placed to patient regarding [facility] referral. LCSW introduced self and explained role at [facility]. Pt was informed of referral to address behavioral health and/or resource needs.   Pt agreed to call LCSW back with her schedule availability. No additional concerns noted.

## 2020-04-14 ENCOUNTER — Encounter: Payer: Self-pay | Admitting: *Deleted

## 2020-04-14 ENCOUNTER — Encounter: Payer: Self-pay | Admitting: Family Medicine

## 2020-05-06 ENCOUNTER — Ambulatory Visit: Payer: Self-pay | Attending: Family Medicine | Admitting: Family Medicine

## 2020-05-06 ENCOUNTER — Encounter: Payer: Self-pay | Admitting: Family Medicine

## 2020-05-06 ENCOUNTER — Other Ambulatory Visit: Payer: Self-pay

## 2020-05-06 ENCOUNTER — Other Ambulatory Visit: Payer: Self-pay | Admitting: Family Medicine

## 2020-05-06 VITALS — BP 129/80 | HR 69 | Ht 66.0 in | Wt 195.0 lb

## 2020-05-06 DIAGNOSIS — M5432 Sciatica, left side: Secondary | ICD-10-CM

## 2020-05-06 DIAGNOSIS — I10 Essential (primary) hypertension: Secondary | ICD-10-CM

## 2020-05-06 DIAGNOSIS — K429 Umbilical hernia without obstruction or gangrene: Secondary | ICD-10-CM

## 2020-05-06 DIAGNOSIS — Z1211 Encounter for screening for malignant neoplasm of colon: Secondary | ICD-10-CM

## 2020-05-06 DIAGNOSIS — M5431 Sciatica, right side: Secondary | ICD-10-CM

## 2020-05-06 MED ORDER — LISINOPRIL-HYDROCHLOROTHIAZIDE 20-25 MG PO TABS: 1.0000 | ORAL_TABLET | Freq: Every day | ORAL | 1 refills | Status: DC

## 2020-05-06 NOTE — Patient Instructions (Signed)

## 2020-05-06 NOTE — Progress Notes (Signed)
States that she has a knot in the upper part of her abdomen, states that it painful at times.  Wants to discuss BP medication.

## 2020-05-06 NOTE — Progress Notes (Signed)
Subjective:  Patient ID: Katelyn Hardin, female    DOB: 1971-04-30  Age: 49 y.o. MRN: [id number]  CC: Abdominal Pain   HPI Katelyn Hardin  is a 49 year old female with a history of hypertension, hyperparathyroidism, vertigo, sciatica,hyperparathyroidism status post parathyroidectomy in 6/2020who presents today for follow-up visit.  She has noticed an epigastric mass which is more prominent when she dances at home.  She describes it as a discomfort but is not painful.  She has no nausea, vomiting diarrhea or constipation. CT abdomen and pelvis from 11/2014 revealed: IMPRESSION: 1. Small periumbilical hernia containing only peritoneal fat. 2. Multiple uterine fibroids, unchanged since the prior ultrasound. 3. 2 mm stone in the lower pole of the otherwise normal right kidney. 4. Otherwise, benign appearing abdomen and pelvis.   Compliant with her antihypertensive and has no chest pain or dyspnea.  She sometimes has bilateral sciatica which radiate from her hip down to her lower extremities.  This prevents her from lying on either side of her hip.  She has no numbness in extremities and has had no falls. Past Medical History:  Diagnosis Date  . Anemia   . Bacterial vaginosis   . Complication of anesthesia    Slow to wake up  . Family history of breast cancer   . Fibroids   . History of hiatal hernia   . History of kidney stones   . Hyperparathyroidism ([place])   . Hypertension   . IBS (irritable bowel syndrome)     Past Surgical History:  Procedure Laterality Date  . PARATHYROIDECTOMY N/A 05/09/2019   Procedure: PARATHYROIDECTOMY;  Surgeon: Armandina Gemma, MD;  Location: [facility];  Service: General;  Laterality: N/A;  . TUBAL LIGATION    . TUBAL LIGATION      Family History  Problem Relation Age of Onset  . Cervical cancer Mother   . Thyroid disease Sister   . Breast cancer Sister 18  . Cancer Maternal Grandfather        unknown  . Breast cancer Other         dx in her 81s; MGM's sister  . Breast cancer Other        5 of MGM sisters  . Diabetes Neg Hx     Allergies  Allergen Reactions  . Acetaminophen Other (See Comments)    Jittery and upset stomach  . Aspirin Nausea And Vomiting  . Codeine     Jittery and upset stomach  . Oxycodone-Acetaminophen     Jittery and upset stomach  . Vicodin [Hydrocodone-Acetaminophen]     Jittery and upset stomach    Outpatient Medications Prior to Visit  Medication Sig Dispense Refill  . lisinopril-hydrochlorothiazide (ZESTORETIC) 20-25 MG tablet Take 1 tablet by mouth daily. 90 tablet 1  . meclizine (ANTIVERT) 25 MG tablet Take 1 tablet (25 mg total) by mouth 3 (three) times daily as needed for dizziness. 60 tablet 3  . methocarbamol (ROBAXIN) 500 MG tablet Take 1 tablet (500 mg total) 3 (three) times daily by mouth. X 10 days then prn (Patient taking differently: Take 500 mg by mouth every 8 (eight) hours as needed for muscle spasms. ) 120 tablet 0  . Multiple Vitamin (MULTIVITAMIN WITH MINERALS) TABS tablet Take 1 tablet by mouth daily.    . calcium gluconate 500 MG tablet Take 1 tablet (500 mg total) by mouth 2 (two) times daily. (Patient not taking: Reported on 04/09/2020) 60 tablet 2  . cetirizine (ZYRTEC) 10 MG tablet Take 1  tablet (10 mg total) by mouth daily. (Patient not taking: Reported on 05/06/2020) 30 tablet 3  . cloNIDine (CATAPRES) 0.1 MG tablet Take 1 tablet (0.1 mg total) by mouth daily. For hot flashes (Patient not taking: Reported on 05/06/2020) 90 tablet 1  . clotrimazole-betamethasone (LOTRISONE) cream Apply 1 application topically 2 (two) times daily as needed (rash). (Patient not taking: Reported on 05/06/2020) 45 g 1  . ergocalciferol (DRISDOL) 1.25 MG (50000 UT) capsule Take 1 capsule (50,000 Units total) by mouth once a week. (Patient not taking: Reported on 10/23/2019) 9 capsule 1  . fluticasone (FLONASE) 50 MCG/ACT nasal spray Place 1 spray into both nostrils daily. (Patient not taking:  Reported on 04/09/2020) 16 g 2  . gabapentin (NEURONTIN) 300 MG capsule Take 1 capsule (300 mg total) by mouth 3 (three) times daily. (Patient not taking: Reported on 10/23/2019) 90 capsule 1  . metroNIDAZOLE (METROGEL VAGINAL) 0.75 % vaginal gel Place 1 Applicatorful vaginally at bedtime. (Patient not taking: Reported on 05/06/2020) 70 g 0  . polyethylene glycol powder (GLYCOLAX/MIRALAX) 17 GM/SCOOP powder Take 17 g by mouth 2 (two) times daily as needed. (Patient not taking: Reported on 05/06/2020) 3350 g 1  . Probiotic Product (PROBIOTIC PO) Take 1 capsule by mouth daily. (Patient not taking: Reported on 05/06/2020)    . sulfamethoxazole-trimethoprim (BACTRIM DS) 800-160 MG tablet Take 1 tablet by mouth 2 (two) times daily. (Patient not taking: Reported on 05/06/2020) 10 tablet 0  . traMADol (ULTRAM) 50 MG tablet Take 1-2 tablets (50-100 mg total) by mouth every 6 (six) hours as needed. (Patient not taking: Reported on 07/09/2019) 15 tablet 0   No facility-administered medications prior to visit.     ROS Review of Systems  Constitutional: Negative for activity change, appetite change and fatigue.  HENT: Negative for congestion, sinus pressure and sore throat.   Eyes: Negative for visual disturbance.  Respiratory: Negative for cough, chest tightness, shortness of breath and wheezing.   Cardiovascular: Negative for chest pain and palpitations.  Gastrointestinal: Negative for abdominal distention, abdominal pain and constipation.  Endocrine: Negative for polydipsia.  Genitourinary: Negative for dysuria and frequency.  Musculoskeletal:       See hpi  Skin: Negative for rash.  Neurological: Negative for tremors, light-headedness and numbness.  Hematological: Does not bruise/bleed easily.  Psychiatric/Behavioral: Negative for agitation.    Objective:  BP 129/80   Pulse 69   Ht 5\' 6"  (1.676 m)   Wt 195 lb (88.5 kg)   LMP 11/19/2016   SpO2 98%   BMI 31.47 kg/m   BP/Weight 05/06/2020  10/23/2019 1/61/0960  Systolic BP 454 098 119  Diastolic BP 80 71 76  Wt. (Lbs) 195 190 -  BMI 31.47 30.67 -      Physical Exam Constitutional:      Appearance: She is well-developed.  Neck:     Vascular: No JVD.  Cardiovascular:     Rate and Rhythm: Normal rate.     Heart sounds: Normal heart sounds. No murmur heard.   Pulmonary:     Effort: Pulmonary effort is normal.     Breath sounds: Normal breath sounds. No wheezing or rales.  Chest:     Chest wall: No tenderness.  Abdominal:     General: Bowel sounds are normal. There is no distension.     Palpations: Abdomen is soft. There is mass (small subcutaneous mass superior to umbilicus).     Tenderness: There is no abdominal tenderness.  Musculoskeletal:  General: Normal range of motion.     Right lower leg: No edema.     Left lower leg: No edema.  Neurological:     Mental Status: She is alert and oriented to person, place, and time.  Psychiatric:        Mood and Affect: Mood normal.     CMP Latest Ref Rng & Units 04/09/2020 10/23/2019 07/09/2019  Glucose 65 - 99 mg/dL 83 90 95  BUN 6 - 24 mg/dL 17 12 13   Creatinine 0.57 - 1.00 mg/dL 0.94 0.81 0.92  Sodium 134 - 144 mmol/L 140 142 142  Potassium 3.5 - 5.2 mmol/L 3.9 3.8 3.7  Chloride 96 - 106 mmol/L 100 103 101  CO2 20 - 29 mmol/L 25 27 25   Calcium 8.7 - 10.2 mg/dL 9.3 9.5 8.2(L)  Total Protein 6.0 - 8.5 g/dL 7.5 7.3 7.3  Total Bilirubin 0.0 - 1.2 mg/dL 0.3 0.4 0.2  Alkaline Phos 48 - 121 IU/L 85 99 97  AST 0 - 40 IU/L 19 28 23   ALT 0 - 32 IU/L 17 30 23     Lipid Panel     Component Value Date/Time   CHOL 183 10/23/2019 1110   TRIG 112 10/23/2019 1110   HDL 42 10/23/2019 1110   CHOLHDL 4.4 10/23/2019 1110   CHOLHDL 3.6 04/04/2013 1022   VLDL 20 04/04/2013 1022   LDLCALC 121 (H) 10/23/2019 1110    CBC    Component Value Date/Time   WBC 5.7 04/09/2020 1005   WBC 8.2 05/12/2019 2110   RBC 4.14 04/09/2020 1005   RBC 3.44 (L) 05/12/2019 2110   HGB  13.0 04/09/2020 1005   HCT 38.5 04/09/2020 1005   PLT 345 04/09/2020 1005   MCV 93 04/09/2020 1005   MCH 31.4 04/09/2020 1005   MCH 31.7 05/12/2019 2110   MCHC 33.8 04/09/2020 1005   MCHC 32.2 05/12/2019 2110   RDW 13.3 04/09/2020 1005   LYMPHSABS 2.3 04/09/2020 1005   MONOABS 0.5 02/07/2014 1711   EOSABS 0.1 04/09/2020 1005   BASOSABS 0.0 04/09/2020 1005    Lab Results  Component Value Date   HGBA1C 5.10 12/04/2014    Assessment & Plan:     1. Essential hypertension, benign Controlled Counseled on blood pressure goal of less than 130/80, low-sodium, DASH diet, medication compliance, 150 minutes of moderate intensity exercise per week. Discussed medication compliance, adverse effects. - lisinopril-hydrochlorothiazide (ZESTORETIC) 20-25 MG tablet; Take 1 tablet by mouth daily.  Dispense: 90 tablet; Refill: 1  2. Screening for colon cancer - Ambulatory referral to Gastroenterology  3. Bilateral sciatica Counseled on exercises, yoga Continue with gabapentin as needed and muscle relaxant as needed  4. Periumbilical hernia CT findings discussed with the patient -periumbilical hernia containing fat She has been reassured  Return in about 6 months (around 11/05/2020) for Chronic disease management.   Charlott Rakes, MD, FAAFP. [facility] [place], [place]   05/06/2020, 4:00 PM

## 2020-05-07 ENCOUNTER — Encounter: Payer: Self-pay | Admitting: Gastroenterology

## 2020-06-21 ENCOUNTER — Encounter: Payer: Self-pay | Admitting: Family Medicine

## 2020-06-22 ENCOUNTER — Other Ambulatory Visit: Payer: Self-pay | Admitting: Family Medicine

## 2020-06-22 MED ORDER — CEPHALEXIN 500 MG PO CAPS
500.0000 mg | ORAL_CAPSULE | Freq: Two times a day (BID) | ORAL | 0 refills | Status: DC
Start: 2020-06-22 — End: 2021-12-07

## 2020-07-06 ENCOUNTER — Encounter: Payer: Self-pay | Admitting: Family Medicine

## 2020-07-13 ENCOUNTER — Telehealth: Payer: Self-pay

## 2020-07-13 NOTE — Telephone Encounter (Signed)
Pt call to rs previsit

## 2020-07-20 ENCOUNTER — Encounter: Payer: Self-pay | Admitting: Gastroenterology

## 2020-08-11 ENCOUNTER — Other Ambulatory Visit: Payer: Self-pay | Admitting: Family Medicine

## 2020-08-11 ENCOUNTER — Encounter: Payer: Self-pay | Admitting: Family Medicine

## 2020-08-11 MED ORDER — METRONIDAZOLE 0.75 % VA GEL
1.0000 | Freq: Every day | VAGINAL | 0 refills | Status: DC
Start: 2020-08-11 — End: 2021-12-07

## 2020-09-30 ENCOUNTER — Ambulatory Visit: Payer: Self-pay | Attending: Family Medicine

## 2020-09-30 ENCOUNTER — Ambulatory Visit (HOSPITAL_BASED_OUTPATIENT_CLINIC_OR_DEPARTMENT_OTHER): Payer: Self-pay | Admitting: Pharmacist

## 2020-09-30 ENCOUNTER — Other Ambulatory Visit: Payer: Self-pay

## 2020-09-30 DIAGNOSIS — Z23 Encounter for immunization: Secondary | ICD-10-CM

## 2020-09-30 NOTE — Progress Notes (Signed)
Patient presents for vaccination against Influenza per orders of Dr. Margarita Rana. Consent given. Counseling provided. No contraindications exists. Vaccine administered without incident.   Lorel Monaco, PharmD PGY2 Ambulatory Care Resident [place]

## 2020-10-19 ENCOUNTER — Encounter: Payer: Self-pay | Admitting: Family Medicine

## 2020-10-20 ENCOUNTER — Other Ambulatory Visit: Payer: Self-pay | Admitting: Family Medicine

## 2020-10-20 DIAGNOSIS — Z1231 Encounter for screening mammogram for malignant neoplasm of breast: Secondary | ICD-10-CM

## 2020-11-02 ENCOUNTER — Ambulatory Visit: Payer: Self-pay | Attending: Family Medicine

## 2020-11-02 ENCOUNTER — Other Ambulatory Visit: Payer: Self-pay

## 2020-11-02 ENCOUNTER — Telehealth: Payer: Self-pay

## 2020-11-02 DIAGNOSIS — R399 Unspecified symptoms and signs involving the genitourinary system: Secondary | ICD-10-CM

## 2020-11-02 LAB — POCT URINALYSIS DIP (CLINITEK)
Bilirubin, UA: NEGATIVE
Blood, UA: NEGATIVE
Glucose, UA: NEGATIVE mg/dL
Ketones, POC UA: NEGATIVE mg/dL
Leukocytes, UA: NEGATIVE
Nitrite, UA: NEGATIVE
POC PROTEIN,UA: NEGATIVE
Spec Grav, UA: >=1.03 — AB (ref 1.010–1.025)
Urobilinogen, UA: 1 E.U./dL
pH, UA: 6.5 (ref 5.0–8.0)

## 2020-11-02 NOTE — Telephone Encounter (Signed)
Pt came into to clinic to drop off urine sample for possible UTI.  UA results are in chart.

## 2020-11-02 NOTE — Progress Notes (Signed)
Pt arrived at clinic to drop off urine specimen to get evaluated fr possible UTI.  PCP was informed of UA results and pt will be contacted with results also.

## 2020-11-03 ENCOUNTER — Encounter: Payer: Self-pay | Admitting: Family Medicine

## 2020-12-17 ENCOUNTER — Encounter: Payer: Self-pay | Admitting: Family Medicine

## 2020-12-17 ENCOUNTER — Other Ambulatory Visit: Payer: Self-pay | Admitting: Family Medicine

## 2020-12-17 MED ORDER — CLOTRIMAZOLE-BETAMETHASONE 1-0.05 % EX CREA
1.0000 | TOPICAL_CREAM | Freq: Two times a day (BID) | CUTANEOUS | 1 refills | Status: DC | PRN
Start: 2020-12-17 — End: 2020-12-17

## 2021-01-18 ENCOUNTER — Encounter: Payer: Self-pay | Admitting: Genetic Counselor

## 2021-01-18 NOTE — Progress Notes (Signed)
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 The change in variant classification was made as a result of re-review of the evidence in light of new variant interpretation guidelines and/or new information.

## 2021-02-06 ENCOUNTER — Encounter: Payer: Self-pay | Admitting: Family Medicine

## 2021-02-15 ENCOUNTER — Other Ambulatory Visit: Payer: Self-pay | Admitting: Obstetrics and Gynecology

## 2021-02-15 DIAGNOSIS — Z1231 Encounter for screening mammogram for malignant neoplasm of breast: Secondary | ICD-10-CM

## 2021-03-10 ENCOUNTER — Other Ambulatory Visit: Payer: Self-pay | Admitting: Family Medicine

## 2021-03-10 ENCOUNTER — Encounter: Payer: Self-pay | Admitting: Family Medicine

## 2021-03-10 ENCOUNTER — Other Ambulatory Visit: Payer: Self-pay

## 2021-03-10 DIAGNOSIS — I10 Essential (primary) hypertension: Secondary | ICD-10-CM

## 2021-03-10 MED ORDER — LISINOPRIL-HYDROCHLOROTHIAZIDE 20-25 MG PO TABS
1.0000 | ORAL_TABLET | Freq: Every day | ORAL | 0 refills | Status: DC
  Filled 2021-03-10: qty 30, 30d supply, fill #0
  Filled 2021-03-18: qty 90, 90d supply, fill #0

## 2021-03-10 NOTE — Telephone Encounter (Signed)
Requested medication (s) are due for refill today:   Yes  Requested medication (s) are on the active medication list:   Yes  Future visit scheduled:   No   Last ordered: 05/06/2020 #90, 1 refill  Clinic note:   Failed protocol due to lab work due.   Requested Prescriptions  Pending Prescriptions Disp Refills   lisinopril-hydrochlorothiazide (ZESTORETIC) 20-25 MG tablet 90 tablet 1    Sig: TAKE 1 TABLET BY MOUTH DAILY.      Cardiovascular:  ACEI + Diuretic Combos Failed - 03/10/2021  6:57 AM      Failed - Na in normal range and within 180 days    Sodium  Date Value Ref Range Status  04/09/2020 140 134 - 144 mmol/L Final          Failed - K in normal range and within 180 days    Potassium  Date Value Ref Range Status  04/09/2020 3.9 3.5 - 5.2 mmol/L Final          Failed - Cr in normal range and within 180 days    Creat  Date Value Ref Range Status  09/15/2016 0.90 0.50 - 1.10 mg/dL Final   Creatinine, Ser  Date Value Ref Range Status  04/09/2020 0.94 0.57 - 1.00 mg/dL Final          Failed - Ca in normal range and within 180 days    Calcium  Date Value Ref Range Status  04/09/2020 9.3 8.7 - 10.2 mg/dL Final   Calcium, Ion  Date Value Ref Range Status  07/09/2019 4.3 (L) 4.5 - 5.6 mg/dL Final          Passed - Patient is not pregnant      Passed - Last BP in normal range    BP Readings from Last 1 Encounters:  05/06/20 129/80          Passed - Valid encounter within last 6 months    Recent Outpatient Visits           5 months ago Need for influenza vaccination   [place], Jarome Matin, RPH-CPP   10 months ago Screening for colon cancer   [place], Charlane Ferretti, MD   11 months ago Urinary urgency   [place], [place]   1 year ago Annual physical exam   [place], [place], MD   1 year ago  Numbness and tingling   [facility] Charlott Rakes, MD

## 2021-03-11 ENCOUNTER — Ambulatory Visit: Payer: Self-pay

## 2021-03-17 ENCOUNTER — Other Ambulatory Visit: Payer: Self-pay

## 2021-03-18 ENCOUNTER — Other Ambulatory Visit: Payer: Self-pay

## 2021-03-19 ENCOUNTER — Other Ambulatory Visit: Payer: Self-pay

## 2021-06-23 ENCOUNTER — Ambulatory Visit: Payer: Self-pay | Admitting: Family Medicine

## 2021-06-28 ENCOUNTER — Encounter: Payer: Self-pay | Admitting: Family Medicine

## 2021-06-28 ENCOUNTER — Other Ambulatory Visit: Payer: Self-pay | Admitting: Family Medicine

## 2021-06-28 ENCOUNTER — Other Ambulatory Visit: Payer: Self-pay

## 2021-06-28 DIAGNOSIS — I10 Essential (primary) hypertension: Secondary | ICD-10-CM

## 2021-06-28 MED ORDER — LISINOPRIL-HYDROCHLOROTHIAZIDE 20-25 MG PO TABS
1.0000 | ORAL_TABLET | Freq: Every day | ORAL | 0 refills | Status: DC
  Filled 2021-06-28: qty 30, 30d supply, fill #0

## 2021-07-01 ENCOUNTER — Other Ambulatory Visit: Payer: Self-pay

## 2021-07-21 ENCOUNTER — Encounter: Payer: Self-pay | Admitting: Family Medicine

## 2021-07-21 ENCOUNTER — Ambulatory Visit: Payer: Self-pay | Attending: Family Medicine | Admitting: Family Medicine

## 2021-07-21 ENCOUNTER — Ambulatory Visit (HOSPITAL_COMMUNITY)
Admission: RE | Admit: 2021-07-21 | Discharge: 2021-07-21 | Disposition: A | Discharge status: Home / Self Care | Payer: Self-pay | Source: Ambulatory Visit | Attending: Family Medicine | Admitting: Family Medicine

## 2021-07-21 ENCOUNTER — Other Ambulatory Visit: Payer: Self-pay

## 2021-07-21 VITALS — BP 128/85 | HR 70 | Ht 66.0 in | Wt 194.6 lb

## 2021-07-21 DIAGNOSIS — Z780 Asymptomatic menopausal state: Secondary | ICD-10-CM

## 2021-07-21 DIAGNOSIS — G8929 Other chronic pain: Secondary | ICD-10-CM

## 2021-07-21 DIAGNOSIS — I10 Essential (primary) hypertension: Secondary | ICD-10-CM

## 2021-07-21 DIAGNOSIS — M25561 Pain in right knee: Secondary | ICD-10-CM

## 2021-07-21 DIAGNOSIS — Z13228 Encounter for screening for other metabolic disorders: Secondary | ICD-10-CM

## 2021-07-21 DIAGNOSIS — R1031 Right lower quadrant pain: Secondary | ICD-10-CM

## 2021-07-21 DIAGNOSIS — E669 Obesity, unspecified: Secondary | ICD-10-CM

## 2021-07-21 DIAGNOSIS — Z1211 Encounter for screening for malignant neoplasm of colon: Secondary | ICD-10-CM

## 2021-07-21 MED ORDER — MELOXICAM 7.5 MG PO TABS
7.5000 mg | ORAL_TABLET | Freq: Every day | ORAL | 1 refills | Status: DC
  Filled 2021-07-21: qty 30, 30d supply, fill #0

## 2021-07-21 MED ORDER — LISINOPRIL-HYDROCHLOROTHIAZIDE 20-25 MG PO TABS
1.0000 | ORAL_TABLET | Freq: Every day | ORAL | 6 refills | Status: DC
  Filled 2021-07-21 – 2021-08-02 (×2): qty 30, 30d supply, fill #0
  Filled 2021-08-04: qty 90, 90d supply, fill #0
  Filled 2021-11-11: qty 90, 90d supply, fill #1

## 2021-07-21 MED ORDER — POLYETHYLENE GLYCOL 3350 17 GM/SCOOP PO POWD
17.0000 g | Freq: Every day | ORAL | 1 refills | Status: DC
  Filled 2021-07-21: qty 510, 30d supply, fill #0

## 2021-07-21 NOTE — Patient Instructions (Signed)
Menopause Menopause is the normal time of a woman's life when menstrual periods stop completely. It marks the natural end to a woman's ability to become pregnant. It can be defined as the absence of a menstrual period for 12 months without another medical cause. The transition to menopause (perimenopause) most often happens between the ages of 45 and 55, and can last for many years. During perimenopause, hormone levels change in your body, which can cause symptoms and affect your health. Menopause may increase your risk for: Weakened bones (osteoporosis), which causes fractures. Depression. Hardening and narrowing of the arteries (atherosclerosis), which can cause heart attacks and strokes. What are the causes? This condition is usually caused by a natural change in hormone levels that happens as you get older. The condition may also be caused by changes that are not natural, including: Surgery to remove both ovaries (surgical menopause). Side effects from some medicines, such as chemotherapy used to treat cancer (chemical menopause). What increases the risk? This condition is more likely to start at an earlier age if you have certain medical conditions or have undergone treatments, including: A tumor of the pituitary gland in the brain. A disease that affects the ovaries and hormones. Certain cancer treatments, such as chemotherapy or hormone therapy, or radiation therapy on the pelvis. Heavy smoking and excessive alcohol use. Family history of early menopause. This condition is also more likely to develop earlier in women who are very thin. What are the signs or symptoms? Symptoms of this condition include: Hot flashes. Irregular menstrual periods. Night sweats. Changes in feelings about sex. This could be a decrease in sex drive or an increased discomfort around your sexuality. Vaginal dryness and thinning of the vaginal walls. This may cause painful sex. Dryness of the skin and  development of wrinkles. Headaches. Problems sleeping (insomnia). Mood swings or irritability. Memory problems. Weight gain. Hair growth on the face and chest. Bladder infections or problems with urinating. How is this diagnosed? This condition is diagnosed based on your medical history, a physical exam, your age, your menstrual history, and your symptoms. Hormone tests may also be done. How is this treated? In some cases, no treatment is needed. You and your health care provider should make a decision together about whether treatment is necessary. Treatment will be based on your individual condition and preferences. Treatment for this condition focuses on managing symptoms. Treatment may include: Menopausal hormone therapy (MHT). Medicines to treat specific symptoms or complications. Acupuncture. Vitamin or herbal supplements. Before starting treatment, make sure to let your health care provider know if you have a personal or family history of these conditions: Heart disease. Breast cancer. Blood clots. Diabetes. Osteoporosis. Follow these instructions at home: Lifestyle Do not use any products that contain nicotine or tobacco, such as cigarettes, e-cigarettes, and chewing tobacco. If you need help quitting, ask your health care provider. Get at least 30 minutes of physical activity on 5 or more days each week. Avoid alcoholic and caffeinated beverages, as well as spicy foods. This may help prevent hot flashes. Get 7-8 hours of sleep each night. If you have hot flashes, try: Dressing in layers. Avoiding things that may trigger hot flashes, such as spicy food, warm places, or stress. Taking slow, deep breaths when a hot flash starts. Keeping a fan in your home and office. Find ways to manage stress, such as deep breathing, meditation, or journaling. Consider going to group therapy with other women who are having menopause symptoms. Ask your health care   provider about recommended  group therapy meetings. Eating and drinking  Eat a healthy, balanced diet that contains whole grains, lean protein, low-fat dairy, and plenty of fruits and vegetables. Your health care provider may recommend adding more soy to your diet. Foods that contain soy include tofu, tempeh, and soy milk. Eat plenty of foods that contain calcium and vitamin D for bone health. Items that are rich in calcium include low-fat milk, yogurt, beans, almonds, sardines, broccoli, and kale. Medicines Take over-the-counter and prescription medicines only as told by your health care provider. Talk with your health care provider before starting any herbal supplements. If prescribed, take vitamins and supplements as told by your health care provider. General instructions  Keep track of your menstrual periods, including: When they occur. How heavy they are and how long they last. How much time passes between periods. Keep track of your symptoms, noting when they start, how often you have them, and how long they last. Use vaginal lubricants or moisturizers to help with vaginal dryness and improve comfort during sex. Keep all follow-up visits. This is important. This includes any group therapy or counseling. Contact a health care provider if: You are still having menstrual periods after age 44. You have pain during sex. You have not had a period for 12 months and you develop vaginal bleeding. Get help right away if you have: Severe depression. Excessive vaginal bleeding. Pain when you urinate. A fast or irregular heartbeat (palpitations). Severe headaches. Abdominal pain or severe indigestion. Summary Menopause is a normal time of life when menstrual periods stop completely. It is usually defined as the absence of a menstrual period for 12 months without another medical cause. The transition to menopause (perimenopause) most often happens between the ages of 31 and 27 and can last for several years. Symptoms  can be managed through medicines, lifestyle changes, and complementary therapies such as acupuncture. Eat a balanced diet that is rich in nutrients to promote bone health and heart health and to manage symptoms during menopause. This information is not intended to replace advice given to you by your health care provider. Make sure you discuss any questions you have with your health care provider. Document Revised: 08/07/2020 Document Reviewed: 04/23/2020 Elsevier Patient Education  [place].

## 2021-07-21 NOTE — Progress Notes (Signed)
Subjective:  Patient ID: Katelyn Hardin, female    DOB: 06-Mar-1971  Age: 50 y.o. MRN: [id number]  CC: Abdominal Pain   HPI Katelyn Hardin is a 50 y.o. year old female with a history of hypertension, hyperparathyroidism, vertigo, sciatica, hyperparathyroidism status post parathyroidectomy in 04/2019 who presents today for follow-up visit.  Interval History: Her R knee hurts when she walks up the stairs and gets to a 10/10 and has been intermittent.  Symptoms have been present for 1 year. She has had RLQ pain, nausea and vomiting which is intermittent. She is not constipated but states she has IBS. Feels bloated. Uses the bathroom but not sure she moves her bowel completely even though she goes every day. Complains of being depressed with her weight, exhausted. Eating a lot, Is an emotional eater.Has a hard time focussing. Her periods stopped 2 years ago.  Compliant with her antihypertensive.  The only medication she currently takes is her antihypertensive. Past Medical History:  Diagnosis Date   Anemia    Bacterial vaginosis    Complication of anesthesia    Slow to wake up   Family history of breast cancer    Fibroids    History of hiatal hernia    History of kidney stones    Hyperparathyroidism ([place])    Hypertension    IBS (irritable bowel syndrome)     Past Surgical History:  Procedure Laterality Date   PARATHYROIDECTOMY N/A 05/09/2019   Procedure: PARATHYROIDECTOMY;  Surgeon: Armandina Gemma, MD;  Location: [facility];  Service: General;  Laterality: N/A;   TUBAL LIGATION     TUBAL LIGATION      Family History  Problem Relation Age of Onset   Cervical cancer Mother    Thyroid disease Sister    Breast cancer Sister 67   Cancer Maternal Grandfather        unknown   Breast cancer Other        dx in her 61s; MGM's sister   Breast cancer Other        54 of MGM sisters   Diabetes Neg Hx     Allergies  Allergen Reactions   Acetaminophen Other (See Comments)     Jittery and upset stomach   Aspirin Nausea And Vomiting   Codeine     Jittery and upset stomach   Oxycodone-Acetaminophen     Jittery and upset stomach   Vicodin [Hydrocodone-Acetaminophen]     Jittery and upset stomach    Outpatient Medications Prior to Visit  Medication Sig Dispense Refill   cetirizine (ZYRTEC) 10 MG tablet Take 1 tablet (10 mg total) by mouth daily. 30 tablet 3   cloNIDine (CATAPRES) 0.1 MG tablet Take 1 tablet (0.1 mg total) by mouth daily. For hot flashes 90 tablet 1   clotrimazole-betamethasone (LOTRISONE) cream APPLY 1 APPLICATION TOPICALLY 2 TIMES DAILY AS NEEDED (RASH). 45 g 1   ergocalciferol (DRISDOL) 1.25 MG (50000 UT) capsule Take 1 capsule (50,000 Units total) by mouth once a week. 9 capsule 1   fluticasone (FLONASE) 50 MCG/ACT nasal spray Place 1 spray into both nostrils daily. 16 g 2   gabapentin (NEURONTIN) 300 MG capsule Take 1 capsule (300 mg total) by mouth 3 (three) times daily. 90 capsule 1   meclizine (ANTIVERT) 25 MG tablet Take 1 tablet (25 mg total) by mouth 3 (three) times daily as needed for dizziness. 60 tablet 3   methocarbamol (ROBAXIN) 500 MG tablet Take 1 tablet (500 mg total) 3 (three)  times daily by mouth. X 10 days then prn (Patient taking differently: Take 500 mg by mouth every 8 (eight) hours as needed for muscle spasms.) 120 tablet 0   Multiple Vitamin (MULTIVITAMIN WITH MINERALS) TABS tablet Take 1 tablet by mouth daily.     Probiotic Product (PROBIOTIC PO) Take 1 capsule by mouth daily.     traMADol (ULTRAM) 50 MG tablet Take 1-2 tablets (50-100 mg total) by mouth every 6 (six) hours as needed. 15 tablet 0   lisinopril-hydrochlorothiazide (ZESTORETIC) 20-25 MG tablet TAKE 1 TABLET BY MOUTH DAILY. 30 tablet 0   polyethylene glycol powder (GLYCOLAX/MIRALAX) 17 GM/SCOOP powder Take 17 g by mouth 2 (two) times daily as needed. 3350 g 1   calcium gluconate 500 MG tablet Take 1 tablet (500 mg total) by mouth 2 (two) times daily. (Patient  not taking: No sig reported) 60 tablet 2   cephALEXin (KEFLEX) 500 MG capsule Take 1 capsule (500 mg total) by mouth 2 (two) times daily. (Patient not taking: Reported on 07/21/2021) 6 capsule 0   metroNIDAZOLE (METROGEL VAGINAL) 0.75 % vaginal gel Place 1 Applicatorful vaginally at bedtime. (Patient not taking: Reported on 07/21/2021) 70 g 0   sulfamethoxazole-trimethoprim (BACTRIM DS) 800-160 MG tablet Take 1 tablet by mouth 2 (two) times daily. (Patient not taking: No sig reported) 10 tablet 0   No facility-administered medications prior to visit.     ROS Review of Systems  Constitutional:  Positive for fatigue. Negative for activity change and appetite change.  HENT:  Negative for congestion, sinus pressure and sore throat.   Eyes:  Negative for visual disturbance.  Respiratory:  Negative for cough, chest tightness, shortness of breath and wheezing.   Cardiovascular:  Negative for chest pain and palpitations.  Gastrointestinal:  Positive for abdominal pain. Negative for abdominal distention and constipation.  Endocrine: Negative for polydipsia.  Genitourinary:  Negative for dysuria and frequency.  Musculoskeletal:  Positive for arthralgias. Negative for back pain.  Skin:  Negative for rash.  Neurological:  Negative for tremors, light-headedness and numbness.  Hematological:  Does not bruise/bleed easily.  Psychiatric/Behavioral:  Negative for agitation and behavioral problems.    Objective:  BP 128/85   Pulse 70   Ht _0  (1.676 m)   Wt 194 lb 9.6 oz (88.3 kg)   LMP 11/19/2016   SpO2 100%   BMI 31.41 kg/m   BP/Weight 07/21/2021 05/06/2020 91/05/9149  Systolic BP 569 794 801  Diastolic BP 85 80 71  Wt. (Lbs) 194.6 195 190  BMI 31.41 31.47 30.67      Physical Exam Constitutional:      Appearance: She is well-developed.  Cardiovascular:     Rate and Rhythm: Normal rate.     Heart sounds: Normal heart sounds. No murmur heard. Pulmonary:     Effort: Pulmonary effort is  normal.     Breath sounds: Normal breath sounds. No wheezing or rales.  Chest:     Chest wall: No tenderness.  Abdominal:     General: Bowel sounds are normal. There is no distension.     Palpations: Abdomen is soft. There is no mass.     Tenderness: There is no abdominal tenderness.  Musculoskeletal:        General: Normal range of motion.     Right lower leg: No edema.     Left lower leg: No edema.  Neurological:     Mental Status: She is alert and oriented to person, place, and time.  Psychiatric:  Mood and Affect: Mood normal.    CMP Latest Ref Rng & Units 04/09/2020 10/23/2019 07/09/2019  Glucose 65 - 99 mg/dL 83 90 95  BUN 6 - 24 mg/dL _0 Creatinine 0.57 - 1.00 mg/dL 0.94 0.81 0.92  Sodium 134 - 144 mmol/L 140 142 142  Potassium 3.5 - 5.2 mmol/L 3.9 3.8 3.7  Chloride 96 - 106 mmol/L 100 103 101  CO2 20 - 29 mmol/L _1 Calcium 8.7 - 10.2 mg/dL 9.3 9.5 8.2(L)  Total Protein 6.0 - 8.5 g/dL 7.5 7.3 7.3  Total Bilirubin 0.0 - 1.2 mg/dL 0.3 0.4 0.2  Alkaline Phos 48 - 121 IU/L 85 99 97  AST 0 - 40 IU/L _2 ALT 0 - 32 IU/L _3 Lipid Panel     Component Value Date/Time   CHOL 183 10/23/2019 1110   TRIG 112 10/23/2019 1110   HDL 42 10/23/2019 1110   CHOLHDL 4.4 10/23/2019 1110   CHOLHDL 3.6 04/04/2013 1022   VLDL 20 04/04/2013 1022   LDLCALC 121 (H) 10/23/2019 1110    CBC    Component Value Date/Time   WBC 5.7 04/09/2020 1005   WBC 8.2 05/12/2019 2110   RBC 4.14 04/09/2020 1005   RBC 3.44 (L) 05/12/2019 2110   HGB 13.0 04/09/2020 1005   HCT 38.5 04/09/2020 1005   PLT 345 04/09/2020 1005   MCV 93 04/09/2020 1005   MCH 31.4 04/09/2020 1005   MCH 31.7 05/12/2019 2110   MCHC 33.8 04/09/2020 1005   MCHC 32.2 05/12/2019 2110   RDW 13.3 04/09/2020 1005   LYMPHSABS 2.3 04/09/2020 1005   MONOABS 0.5 02/07/2014 1711   EOSABS 0.1 04/09/2020 1005   BASOSABS 0.0 04/09/2020 1005    Lab Results  Component Value Date   HGBA1C 5.10  12/04/2014    Assessment & Plan:  1. Essential hypertension, benign Controlled Counseled on blood pressure goal of less than 130/80, low-sodium, DASH diet, medication compliance, 150 minutes of moderate intensity exercise per week. Discussed medication compliance, adverse effects. - CMP14+EGFR - lisinopril-hydrochlorothiazide (ZESTORETIC) 20-25 MG tablet; TAKE 1 TABLET BY MOUTH DAILY.  Dispense: 30 tablet; Refill: 6  2. Right lower quadrant abdominal pain Likely secondary to constipation Pain is intermittent and absent at this time Trial of MiraLAX and if symptoms persist consider imaging - polyethylene glycol powder (GLYCOLAX/MIRALAX) 17 GM/SCOOP powder; Take 17 g by mouth daily.  Dispense: 3350 g; Refill: 1  3. Menopause Could explain constellation of symptoms including fatigue, depression, inability to lose weight She declines psychotherapy or CBT Also endorses vasomotor menopausal symptoms Counseled on available pharmacotherapeutic options including HRT, SSRI but she has a family history of cancer and would not like HRT She is undecided regarding SSRI Counseled on yoga, exercise and she currently does not exercise as research has revealed improvement with this nonpharmacological modalities.   4. Screening for metabolic disorder - Hemoglobin A1c  5. Chronic pain of right knee Need to evaluate for osteoarthritis - DG Knee Complete 4 Views Right; Future - meloxicam (MOBIC) 7.5 MG tablet; Take 1 tablet (7.5 mg total) by mouth daily.  Dispense: 30 tablet; Refill: 1  6. Screening for colon cancer - Fecal occult blood, imunochemical([facility])  7. Mild obesity Will check A1c and she is open to commencing a GLP-1 if she is in the prediabetic range.  If A1c is normal consider referral to weight management Avoiding emotional eating, reducing portion sizes, exercise weight beneficial  She has been advised to apply for the [facility] financial discount to facilitate  referral.   Meds ordered this encounter  Medications   lisinopril-hydrochlorothiazide (ZESTORETIC) 20-25 MG tablet    Sig: TAKE 1 TABLET BY MOUTH DAILY.    Dispense:  30 tablet    Refill:  6    On 06/28/2021 a 30 day courtesy supply given to last until appt on 08/16/2021.   polyethylene glycol powder (GLYCOLAX/MIRALAX) 17 GM/SCOOP powder    Sig: Take 17 g by mouth daily.    Dispense:  3350 g    Refill:  1   meloxicam (MOBIC) 7.5 MG tablet    Sig: Take 1 tablet (7.5 mg total) by mouth daily.    Dispense:  30 tablet    Refill:  1    Follow-up: Return in about 6 months (around 01/18/2022) for Medical conditions.       Charlott Rakes, MD, FAAFP. [facility] and [place], [place]   07/21/2021, 12:43 PM

## 2021-07-22 ENCOUNTER — Telehealth: Payer: Self-pay | Admitting: Family Medicine

## 2021-07-22 ENCOUNTER — Encounter: Payer: Self-pay | Admitting: Family Medicine

## 2021-07-22 ENCOUNTER — Other Ambulatory Visit: Payer: Self-pay

## 2021-07-22 ENCOUNTER — Other Ambulatory Visit: Payer: Self-pay | Admitting: Family Medicine

## 2021-07-22 DIAGNOSIS — E669 Obesity, unspecified: Secondary | ICD-10-CM

## 2021-07-22 LAB — CMP14+EGFR
ALT: 26 IU/L (ref 0–32)
AST: 23 IU/L (ref 0–40)
Albumin/Globulin Ratio: 1.4 (ref 1.2–2.2)
Albumin: 4.5 g/dL (ref 3.8–4.9)
Alkaline Phosphatase: 98 IU/L (ref 44–121)
BUN/Creatinine Ratio: 12 (ref 9–23)
BUN: 13 mg/dL (ref 6–24)
Bilirubin Total: 0.2 mg/dL (ref 0.0–1.2)
CO2: 27 mmol/L (ref 20–29)
Calcium: 9.8 mg/dL (ref 8.7–10.2)
Chloride: 101 mmol/L (ref 96–106)
Creatinine, Ser: 1.13 mg/dL — ABNORMAL HIGH (ref 0.57–1.00)
Globulin, Total: 3.3 g/dL (ref 1.5–4.5)
Glucose: 88 mg/dL (ref 65–99)
Potassium: 4.1 mmol/L (ref 3.5–5.2)
Sodium: 141 mmol/L (ref 134–144)
Total Protein: 7.8 g/dL (ref 6.0–8.5)
eGFR: 59 mL/min/{1.73_m2} — ABNORMAL LOW (ref >59)

## 2021-07-22 LAB — HEMOGLOBIN A1C
Est. average glucose Bld gHb Est-mCnc: 111 mg/dL
Hgb A1c MFr Bld: 5.5 % (ref 4.8–5.6)

## 2021-07-22 NOTE — Telephone Encounter (Signed)
Copied from [place] [id number]. Topic: General - Other >> Jul 21, 2021 12:07 PM Alanda Slim E wrote: Reason for CRM: Pt called to speak with Clifton James to see if she has any financial assistance through the office / please advise >> Jul 21, 2021  1:33 PM Oneta Rack wrote: If you already

## 2021-07-22 NOTE — Telephone Encounter (Signed)
I return Pt call, she said that she already spoke with someone and everything has been taking care

## 2021-07-27 ENCOUNTER — Other Ambulatory Visit (HOSPITAL_COMMUNITY): Payer: Self-pay

## 2021-07-28 ENCOUNTER — Other Ambulatory Visit: Payer: Self-pay

## 2021-07-29 ENCOUNTER — Other Ambulatory Visit: Payer: Self-pay

## 2021-07-29 DIAGNOSIS — Z1231 Encounter for screening mammogram for malignant neoplasm of breast: Secondary | ICD-10-CM

## 2021-07-30 ENCOUNTER — Ambulatory Visit: Payer: Self-pay

## 2021-07-31 ENCOUNTER — Other Ambulatory Visit: Payer: Self-pay | Admitting: Obstetrics and Gynecology

## 2021-07-31 DIAGNOSIS — Z1231 Encounter for screening mammogram for malignant neoplasm of breast: Secondary | ICD-10-CM

## 2021-08-02 ENCOUNTER — Other Ambulatory Visit: Payer: Self-pay

## 2021-08-03 ENCOUNTER — Ambulatory Visit: Payer: Self-pay

## 2021-08-04 ENCOUNTER — Other Ambulatory Visit: Payer: Self-pay

## 2021-08-16 ENCOUNTER — Ambulatory Visit: Payer: Self-pay | Admitting: Nurse Practitioner

## 2021-08-27 ENCOUNTER — Encounter: Payer: Self-pay | Admitting: Family Medicine

## 2021-09-02 ENCOUNTER — Ambulatory Visit: Payer: Self-pay | Admitting: *Deleted

## 2021-09-02 ENCOUNTER — Other Ambulatory Visit: Payer: Self-pay

## 2021-09-02 ENCOUNTER — Ambulatory Visit
Admission: RE | Admit: 2021-09-02 | Discharge: 2021-09-02 | Disposition: A | Discharge status: Home / Self Care | Payer: No Typology Code available for payment source | Source: Ambulatory Visit | Attending: Family Medicine | Admitting: Family Medicine

## 2021-09-02 VITALS — BP 144/90 | Wt 202.7 lb

## 2021-09-02 DIAGNOSIS — Z1239 Encounter for other screening for malignant neoplasm of breast: Secondary | ICD-10-CM

## 2021-09-02 DIAGNOSIS — Z1211 Encounter for screening for malignant neoplasm of colon: Secondary | ICD-10-CM

## 2021-09-02 NOTE — Patient Instructions (Addendum)
Explained breast self awareness with Katelyn Hardin. Patient did not need a Pap smear today due to last Pap smear and HPV typing was 10/23/2019. Let her know [facility] will cover Pap smears and HPV typing every 5 years unless has a history of abnormal Pap smears. Referred patient to the [facility] for a screening mammogram on mobile unit. Appointment scheduled Thursday, September 02, 2021 at 1030. Patient escorted to the mobile unit following [facility] appointment for her screening mammogram. Let patient know the [facility] will follow up with her within the next couple weeks with results of her mammogram by letter or phone. Katelyn Hardin verbalized understanding.  Ottis Vacha, Arvil Chaco, RN 10:03 AM

## 2021-09-02 NOTE — Progress Notes (Signed)
Ms. Natoshia Souter McAdoo-Frazier is a 50 y.o. female who presents to [facility] clinic today with no complaints.    Pap Smear: Pap smear not completed today. Last Pap smear was 10/23/2019 at [facility] and was normal with negative HPV. Per patient has history of an abnormal Pap smear around 20 years ago that a repeat Pap smear was completed for follow-up. Patient stated she has had at least three normal Pap smears since abnormal. Last Pap smear result is available in Epic.   Physical exam: Breasts Breasts symmetrical. No skin abnormalities bilateral breasts. No nipple retraction bilateral breasts. No nipple discharge bilateral breasts. No lymphadenopathy. No lumps palpated bilateral breasts. No complaints of pain or tenderness on exam.     MS DIGITAL SCREENING TOMO BILATERAL  Result Date: 01/27/2017 CLINICAL DATA:  Screening. EXAM: 2D DIGITAL SCREENING BILATERAL MAMMOGRAM WITH CAD AND ADJUNCT TOMO COMPARISON:  Previous exam(s). ACR Breast Density Category b: There are scattered areas of fibroglandular density. FINDINGS: There are no findings suspicious for malignancy. Images were processed with CAD. IMPRESSION: No mammographic evidence of malignancy. A result letter of this screening mammogram will be mailed directly to the patient. RECOMMENDATION: Screening mammogram in one year. (Code:SM-B-01Y) BI-RADS CATEGORY  1: Negative. Electronically Signed   By: Lajean Manes M.D.   On: 01/27/2017 11:47    Pelvic/Bimanual Pap is not indicated today per [facility]CP guidelines.   Smoking History: Patient has never smoked.   Patient Navigation: Patient education provided. Access to services provided for patient through [facility] program.   Colorectal Cancer Screening: Per patient has had colonoscopy completed on 07/13/2006 at [facility]. FIT test given to patient to complete.  No complaints today.    Breast and Cervical Cancer Risk Assessment: Patient has family history of a half  sister and four maternal aunts having breast cancer. Patient has no known genetic mutations or history of radiation treatment to the chest before age 40. Patient does not have history of cervical dysplasia, immunocompromised, or DES exposure in-utero.  Risk Assessment     Risk Scores       09/02/2021   Last edited by: Demetrius Revel, LPN   5-year risk: 1.9 %   Lifetime risk: 13.3 %            A: [facility] exam without pap smear No complaints.  P: Referred patient to the [facility] for a screening mammogram on mobile unit. Appointment scheduled Thursday, September 02, 2021 at 1030.  Loletta Parish, RN 09/02/2021 10:03 AM

## 2021-09-15 ENCOUNTER — Encounter: Payer: Self-pay | Admitting: Family Medicine

## 2021-09-27 ENCOUNTER — Encounter: Payer: Self-pay | Admitting: Family Medicine

## 2021-09-27 ENCOUNTER — Other Ambulatory Visit: Payer: Self-pay | Admitting: Family Medicine

## 2021-09-27 DIAGNOSIS — N941 Unspecified dyspareunia: Secondary | ICD-10-CM

## 2021-09-27 DIAGNOSIS — Z780 Asymptomatic menopausal state: Secondary | ICD-10-CM

## 2021-09-28 ENCOUNTER — Ambulatory Visit: Payer: No Typology Code available for payment source

## 2021-09-29 ENCOUNTER — Ambulatory Visit: Payer: No Typology Code available for payment source

## 2021-10-06 ENCOUNTER — Other Ambulatory Visit: Payer: Self-pay

## 2021-10-06 ENCOUNTER — Encounter: Payer: Self-pay | Admitting: Family Medicine

## 2021-10-06 ENCOUNTER — Ambulatory Visit: Payer: Self-pay | Attending: Family Medicine | Admitting: Family Medicine

## 2021-10-06 DIAGNOSIS — E669 Obesity, unspecified: Secondary | ICD-10-CM

## 2021-10-06 MED ORDER — TRULICITY 0.75 MG/0.5ML ~~LOC~~ SOAJ
0.7500 mg | SUBCUTANEOUS | 3 refills | Status: DC
Start: 2021-10-06 — End: 2021-12-07
  Filled 2021-10-06: qty 2, 28d supply, fill #0
  Filled 2021-11-16: qty 2, 28d supply, fill #1

## 2021-10-06 NOTE — Progress Notes (Signed)
Virtual Visit via Telephone Note  I connected with [name], on 10/06/2021 at 8:36 AM by telephone due to the COVID-19 pandemic and verified that I am speaking with the correct person using two identifiers.   Consent: I discussed the limitations, risks, security and privacy concerns of performing an evaluation and management service by telephone and the availability of in person appointments. I also discussed with the patient that there may be a patient responsible charge related to this service. The patient expressed understanding and agreed to proceed.   Location of Patient: Home  Location of Provider: Clinic   Persons participating in Telemedicine visit: [name] Ozzie Hoyle Dr. Margarita Rana     History of Present Illness: Katelyn Hardin is a 50 y.o. year old female with a history of hypertension, hyperparathyroidism, vertigo, sciatica, hyperparathyroidism status post parathyroidectomy in 04/2019 seen for weight concerns.   I had referred her to the medical weight management clinic but she was informed she would need to pay $800 for the first visit and $200 for subsequent visits.  She has no medical coverage and does not have the colon health financial discount.  In the past she was informed she did not qualify for the [facility] health financial discount. She states she is an emotional eater She exercises and tries to log her food. She knows what she is supposed to do but it is difficult to do it and it is taking a toll on her and causing her to be Depressed. In addition she has menopausal symptoms, dyspareunia and I had referred her to GYN for this but she is yet to hear from them. Past Medical History:  Diagnosis Date   Anemia    Bacterial vaginosis    Complication of anesthesia    Slow to wake up   Family history of breast cancer    Fibroids    History of hiatal hernia    History of kidney stones     Hyperparathyroidism ([place])    Hypertension    IBS (irritable bowel syndrome)    Allergies  Allergen Reactions   Acetaminophen Other (See Comments)    Jittery and upset stomach   Aspirin Nausea And Vomiting   Codeine     Jittery and upset stomach   Oxycodone-Acetaminophen     Jittery and upset stomach   Vicodin [Hydrocodone-Acetaminophen]     Jittery and upset stomach    Current Outpatient Medications on File Prior to Visit  Medication Sig Dispense Refill   calcium gluconate 500 MG tablet Take 1 tablet (500 mg total) by mouth 2 (two) times daily. (Patient not taking: No sig reported) 60 tablet 2   cephALEXin (KEFLEX) 500 MG capsule Take 1 capsule (500 mg total) by mouth 2 (two) times daily. (Patient not taking: Reported on 07/21/2021) 6 capsule 0   cetirizine (ZYRTEC) 10 MG tablet Take 1 tablet (10 mg total) by mouth daily. 30 tablet 3   cloNIDine (CATAPRES) 0.1 MG tablet Take 1 tablet (0.1 mg total) by mouth daily. For hot flashes 90 tablet 1   clotrimazole-betamethasone (LOTRISONE) cream APPLY 1 APPLICATION TOPICALLY 2 TIMES DAILY AS NEEDED (RASH). 45 g 1   ergocalciferol (DRISDOL) 1.25 MG (50000 UT) capsule Take 1 capsule (50,000 Units total) by mouth once a week. 9 capsule 1   fluticasone (FLONASE) 50 MCG/ACT nasal spray Place 1 spray into both nostrils daily. 16 g 2   gabapentin (NEURONTIN) 300 MG capsule Take 1 capsule (300 mg total) by mouth  3 (three) times daily. 90 capsule 1   lisinopril-hydrochlorothiazide (ZESTORETIC) 20-25 MG tablet TAKE 1 TABLET BY MOUTH DAILY. 30 tablet 6   meclizine (ANTIVERT) 25 MG tablet Take 1 tablet (25 mg total) by mouth 3 (three) times daily as needed for dizziness. 60 tablet 3   meloxicam (MOBIC) 7.5 MG tablet Take 1 tablet (7.5 mg total) by mouth daily. 30 tablet 1   methocarbamol (ROBAXIN) 500 MG tablet Take 1 tablet (500 mg total) 3 (three) times daily by mouth. X 10 days then prn (Patient taking differently: Take 500 mg by mouth every 8 (eight)  hours as needed for muscle spasms.) 120 tablet 0   metroNIDAZOLE (METROGEL VAGINAL) 0.75 % vaginal gel Place 1 Applicatorful vaginally at bedtime. (Patient not taking: Reported on 07/21/2021) 70 g 0   Multiple Vitamin (MULTIVITAMIN WITH MINERALS) TABS tablet Take 1 tablet by mouth daily.     polyethylene glycol powder (GLYCOLAX/MIRALAX) 17 GM/SCOOP powder Take 17 g by mouth daily. 3350 g 1   Probiotic Product (PROBIOTIC PO) Take 1 capsule by mouth daily.     sulfamethoxazole-trimethoprim (BACTRIM DS) 800-160 MG tablet Take 1 tablet by mouth 2 (two) times daily. (Patient not taking: No sig reported) 10 tablet 0   traMADol (ULTRAM) 50 MG tablet Take 1-2 tablets (50-100 mg total) by mouth every 6 (six) hours as needed. 15 tablet 0   No current facility-administered medications on file prior to visit.    ROS: See HPI  Observations/Objective: Awake, alert, oriented x3 Not in acute distress Normal mood   CMP Latest Ref Rng & Units 07/21/2021 04/09/2020 10/23/2019  Glucose 65 - 99 mg/dL 88 83 90  BUN 6 - 24 mg/dL 13 17 12   Creatinine 0.57 - 1.00 mg/dL 1.13(H) 0.94 0.81  Sodium 134 - 144 mmol/L 141 140 142  Potassium 3.5 - 5.2 mmol/L 4.1 3.9 3.8  Chloride 96 - 106 mmol/L 101 100 103  CO2 20 - 29 mmol/L 27 25 27   Calcium 8.7 - 10.2 mg/dL 9.8 9.3 9.5  Total Protein 6.0 - 8.5 g/dL 7.8 7.5 7.3  Total Bilirubin 0.0 - 1.2 mg/dL 0.2 0.3 0.4  Alkaline Phos 44 - 121 IU/L 98 85 99  AST 0 - 40 IU/L 23 19 28   ALT 0 - 32 IU/L 26 17 30     Lipid Panel     Component Value Date/Time   CHOL 183 10/23/2019 1110   TRIG 112 10/23/2019 1110   HDL 42 10/23/2019 1110   CHOLHDL 4.4 10/23/2019 1110   CHOLHDL 3.6 04/04/2013 1022   VLDL 20 04/04/2013 1022   LDLCALC 121 (H) 10/23/2019 1110   LABVLDL 20 10/23/2019 1110    Lab Results  Component Value Date   HGBA1C 5.5 07/21/2021    Assessment and Plan: 1. Mild obesity We have discussed various options available for management of her obesity Given she  does not have diabetes or prediabetes I am not sure if she will be approved for the patient assistance program to obtain Trulicity but she will check with the pharmacy. We have discussed being consistent and disciplined with regards to her regular exercise regimen Given she had complained of depression due to her overeating and weight gain we have discussed management for this including psychotherapy and pharmacotherapy but she declines as she states the root cause is her actual weight gain and she would like to work on that. Caloric restriction, and for 150 minutes of moderate intensity exercise per week. - Dulaglutide (TRULICITY) 5.78 IO/9.6EX  SOPN; Inject 0.75 mg into the skin once a week. For weight management  Dispense: 2 mL; Refill: 3   Follow Up Instructions: Keep previously scheduled appointment   I discussed the assessment and treatment plan with the patient. The patient was provided an opportunity to ask questions and all were answered. The patient agreed with the plan and demonstrated an understanding of the instructions.   The patient was advised to call back or seek an in-person evaluation if the symptoms worsen or if the condition fails to improve as anticipated.     I provided 13 minutes total of non-face-to-face time during this encounter.   Charlott Rakes, MD, FAAFP. [facility] and [place], [place]   10/06/2021, 8:36 AM

## 2021-10-07 ENCOUNTER — Encounter: Payer: Self-pay | Admitting: Family Medicine

## 2021-10-08 ENCOUNTER — Other Ambulatory Visit: Payer: Self-pay

## 2021-10-18 ENCOUNTER — Encounter: Payer: Self-pay | Admitting: Family Medicine

## 2021-10-20 ENCOUNTER — Ambulatory Visit: Payer: Self-pay | Attending: Family Medicine

## 2021-10-20 ENCOUNTER — Other Ambulatory Visit: Payer: Self-pay

## 2021-10-20 DIAGNOSIS — Z23 Encounter for immunization: Secondary | ICD-10-CM

## 2021-10-21 ENCOUNTER — Encounter: Payer: Self-pay | Admitting: Physician Assistant

## 2021-10-21 ENCOUNTER — Other Ambulatory Visit: Payer: Self-pay

## 2021-10-21 ENCOUNTER — Ambulatory Visit: Payer: Self-pay | Attending: Physician Assistant | Admitting: Physician Assistant

## 2021-10-21 VITALS — BP 117/84 | HR 79 | Ht 66.0 in | Wt 204.2 lb

## 2021-10-21 DIAGNOSIS — H1033 Unspecified acute conjunctivitis, bilateral: Secondary | ICD-10-CM

## 2021-10-21 MED ORDER — ERYTHROMYCIN 5 MG/GM OP OINT
1.0000 "application " | TOPICAL_OINTMENT | Freq: Every day | OPHTHALMIC | 0 refills | Status: DC
  Filled 2021-10-21: qty 3.5, 5d supply, fill #0

## 2021-10-21 MED ORDER — OLOPATADINE HCL 0.1 % OP SOLN
1.0000 [drp] | Freq: Two times a day (BID) | OPHTHALMIC | 12 refills | Status: DC
  Filled 2021-10-21: qty 5, 20d supply, fill #0

## 2021-10-21 NOTE — Progress Notes (Signed)
Katelyn Hardin, is a 50 y.o. female  JQB:[id number]  IOX:[id number]  DOB - 02-Aug-1971  Chief Complaint  Patient presents with   Eye Burn   Eye Drainage       Subjective:   Katelyn Hardin is a 50 y.o. female here today for 2 day h/o eyes itching, feeling grainy, and being a little red.  No fever.  No URI s/sx.  Eyes crusted in the morning but no purulent drainage or tearing in the daytime.  No pain.    No problems updated.  ALLERGIES: Allergies  Allergen Reactions   Acetaminophen Other (See Comments)    Jittery and upset stomach   Aspirin Nausea And Vomiting   Codeine     Jittery and upset stomach   Oxycodone-Acetaminophen     Jittery and upset stomach   Vicodin [Hydrocodone-Acetaminophen]     Jittery and upset stomach    PAST MEDICAL HISTORY: Past Medical History:  Diagnosis Date   Anemia    Bacterial vaginosis    Complication of anesthesia    Slow to wake up   Family history of breast cancer    Fibroids    History of hiatal hernia    History of kidney stones    Hyperparathyroidism ([place])    Hypertension    IBS (irritable bowel syndrome)     MEDICATIONS AT HOME: Prior to Admission medications   Medication Sig Start Date End Date Taking? Authorizing Provider  calcium gluconate 500 MG tablet Take 1 tablet (500 mg total) by mouth 2 (two) times daily. 07/10/19  Yes Charlott Rakes, MD  cephALEXin (KEFLEX) 500 MG capsule Take 1 capsule (500 mg total) by mouth 2 (two) times daily. 06/22/20  Yes Charlott Rakes, MD  cetirizine (ZYRTEC) 10 MG tablet Take 1 tablet (10 mg total) by mouth daily. 03/22/18  Yes Charlott Rakes, MD  cloNIDine (CATAPRES) 0.1 MG tablet Take 1 tablet (0.1 mg total) by mouth daily. For hot flashes 01/02/19  Yes Charlott Rakes, MD  clotrimazole-betamethasone (LOTRISONE) cream APPLY 1 APPLICATION TOPICALLY 2 TIMES DAILY AS NEEDED (RASH). 12/17/20 12/17/21 Yes Newlin, Charlane Ferretti, MD  Dulaglutide (TRULICITY) 2.68 TM/1.9QQ SOPN Inject 0.75 mg  into the skin once a week. For weight management 10/06/21  Yes Charlott Rakes, MD  ergocalciferol (DRISDOL) 1.25 MG (50000 UT) capsule Take 1 capsule (50,000 Units total) by mouth once a week. 07/10/19  Yes Charlott Rakes, MD  erythromycin ophthalmic ointment Place 1 application into both eyes at bedtime. 10/21/21  Yes Kayleana Waites M, PA-C  fluticasone (FLONASE) 50 MCG/ACT nasal spray Place 1 spray into both nostrils daily. 11/20/18  Yes Gildardo Pounds, NP  gabapentin (NEURONTIN) 300 MG capsule Take 1 capsule (300 mg total) by mouth 3 (three) times daily. 01/02/19  Yes Newlin, Charlane Ferretti, MD  lisinopril-hydrochlorothiazide (ZESTORETIC) 20-25 MG tablet TAKE 1 TABLET BY MOUTH DAILY. 07/21/21 07/21/22 Yes Charlott Rakes, MD  meclizine (ANTIVERT) 25 MG tablet Take 1 tablet (25 mg total) by mouth 3 (three) times daily as needed for dizziness. 11/20/18  Yes Gildardo Pounds, NP  meloxicam (MOBIC) 7.5 MG tablet Take 1 tablet (7.5 mg total) by mouth daily. 07/21/21  Yes Charlott Rakes, MD  methocarbamol (ROBAXIN) 500 MG tablet Take 1 tablet (500 mg total) 3 (three) times daily by mouth. X 10 days then prn Patient taking differently: Take 500 mg by mouth every 8 (eight) hours as needed for muscle spasms. 10/03/17  Yes Muhsin Doris M, PA-C  metroNIDAZOLE (METROGEL VAGINAL) 0.75 % vaginal gel Place 1  Applicatorful vaginally at bedtime. 08/11/20  Yes Charlott Rakes, MD  Multiple Vitamin (MULTIVITAMIN WITH MINERALS) TABS tablet Take 1 tablet by mouth daily.   Yes [provider]  olopatadine (PATANOL) 0.1 % ophthalmic solution Place 1 drop into both eyes 2 (two) times daily. 10/21/21  Yes Freeman Caldron M, PA-C  polyethylene glycol powder (GLYCOLAX/MIRALAX) 17 GM/SCOOP powder Take 17 g by mouth daily. 07/21/21  Yes Charlott Rakes, MD  Probiotic Product (PROBIOTIC PO) Take 1 capsule by mouth daily.   Yes [provider]  sulfamethoxazole-trimethoprim (BACTRIM DS) 800-160 MG tablet Take 1 tablet  by mouth 2 (two) times daily. 04/09/20  Yes Emmery Seiler, Dionne Bucy, PA-C  traMADol (ULTRAM) 50 MG tablet Take 1-2 tablets (50-100 mg total) by mouth every 6 (six) hours as needed. 05/10/19  Yes Armandina Gemma, MD    ROS: Neg HEENT Neg cardiac Neg GI Neg GU Neg MS Neg psych Neg neuro  Objective:   Vitals:   10/21/21 1028  BP: 117/84  Pulse: 79  SpO2: 100%  Weight: 204 lb 4 oz (92.6 kg)  Height: 5\' 6"  (1.676 m)   Exam General appearance : Awake, alert, not in any distress. Speech Clear. Not toxic looking HEENT: Atraumatic and Normocephalic, pupils equally reactive to light and accomodation, B conjunctivae with minimal erythema and no excessive tearing or drainage. No preauricular LN Neck: Supple, no JVD. No cervical lymphadenopathy.  Chest: Good air entry bilaterally, CTAB.  No rales/rhonchi/wheezing CVS: S1 S2 regular, no murmurs.  Extremities: B/L Lower Ext shows no edema, both legs are warm to touch Neurology: Awake alert, and oriented X 3, CN II-XII intact, Non focal Skin: No Rash  Data Review Lab Results  Component Value Date   HGBA1C 5.5 07/21/2021   HGBA1C 5.10 12/04/2014   HGBA1C 5.0 02/22/2013    Assessment & Plan   1. Acute conjunctivitis of both eyes, unspecified acute conjunctivitis type Most likely allergic but if does not improve with patanol, start ointment tomorrow- olopatadine (PATANOL) 0.1 % ophthalmic solution; Place 1 drop into both eyes 2 (two) times daily.  Dispense: 5 mL; Refill: 12 - erythromycin ophthalmic ointment; Place 1 application into both eyes at bedtime.  Dispense: 3.5 g; Refill: 0    Patient have been counseled extensively about nutrition and exercise. Other issues discussed during this visit include: low cholesterol diet, weight control and daily exercise, foot care, annual eye examinations at Ophthalmology, importance of adherence with medications and regular follow-up. We also discussed long term complications of uncontrolled diabetes and  hypertension.   Return for keep feb 2023 appt.  The patient was given clear instructions to go to ER or return to medical center if symptoms don't improve, worsen or new problems develop. The patient verbalized understanding. The patient was told to call to get lab results if they haven't heard anything in the next week.      Freeman Caldron, PA-C [facility] and [place], [place]   10/21/2021, 11:51 AM Patient ID: Katelyn Hardin, female   DOB: [birth year], 50 y.o.   MRN: [id number]

## 2021-11-11 ENCOUNTER — Other Ambulatory Visit: Payer: Self-pay

## 2021-11-16 ENCOUNTER — Other Ambulatory Visit: Payer: Self-pay

## 2021-12-06 NOTE — Progress Notes (Incomplete)
Established Patient Office Visit  Subjective:  Patient ID: Katelyn Hardin, female    DOB: 23-Mar-1971  Age: 50 y.o. MRN: [id number]  CC: No chief complaint on file.   HPI Katelyn Hardin presents for *** [facility] pt  back pain  Fit  Past Medical History:  Diagnosis Date   Anemia    Bacterial vaginosis    Complication of anesthesia    Slow to wake up   Family history of breast cancer    Fibroids    History of hiatal hernia    History of kidney stones    Hyperparathyroidism ([place])    Hypertension    IBS (irritable bowel syndrome)     Past Surgical History:  Procedure Laterality Date   PARATHYROIDECTOMY N/A 05/09/2019   Procedure: PARATHYROIDECTOMY;  Surgeon: Armandina Gemma, MD;  Location: [facility];  Service: General;  Laterality: N/A;   TUBAL LIGATION     TUBAL LIGATION      Family History  Problem Relation Age of Onset   Cervical cancer Mother    Thyroid disease Sister    Breast cancer Sister 26   Cancer Maternal Grandfather        unknown   Breast cancer Other        dx in her 85s; MGM's sister   Breast cancer Other        20 of MGM sisters   Diabetes Neg Hx     Social History   Socioeconomic History   Marital status: Married    Spouse name: Not on file   Number of children: 2   Years of education: Not on file   Highest education level: Associate degree: occupational, Hotel manager, or vocational program  Occupational History   Not on file  Tobacco Use   Smoking status: Never   Smokeless tobacco: Never  Vaping Use   Vaping Use: Never used  Substance and Sexual Activity   Alcohol use: Yes    Alcohol/week: 6.0 standard drinks    Types: 2 Glasses of wine, 4 Standard drinks or equivalent per week    Comment: 2 glasses of wine daily   Drug use: No   Sexual activity: Yes    Birth control/protection: Post-menopausal  Other Topics Concern   Not on file  Social History Narrative   Not on file    Social Determinants of Health   Financial Resource Strain: Not on file  Food Insecurity: No Food Insecurity   Worried About Running Out of Food in the Last Year: Never true   Ran Out of Food in the Last Year: Never true  Transportation Needs: No Transportation Needs   Lack of Transportation (Medical): No   Lack of Transportation (Non-Medical): No  Physical Activity: Not on file  Stress: Not on file  Social Connections: Not on file  Intimate Partner Violence: Not on file    Outpatient Medications Prior to Visit  Medication Sig Dispense Refill   calcium gluconate 500 MG tablet Take 1 tablet (500 mg total) by mouth 2 (two) times daily. 60 tablet 2   cephALEXin (KEFLEX) 500 MG capsule Take 1 capsule (500 mg total) by mouth 2 (two) times daily. 6 capsule 0   cetirizine (ZYRTEC) 10 MG tablet Take 1 tablet (10 mg total) by mouth daily. 30 tablet 3   cloNIDine (CATAPRES) 0.1 MG tablet Take 1 tablet (0.1 mg total) by mouth daily. For hot flashes 90 tablet 1   clotrimazole-betamethasone (LOTRISONE) cream APPLY 1 APPLICATION TOPICALLY 2 TIMES DAILY  AS NEEDED (RASH). 45 g 1   Dulaglutide (TRULICITY) 9.03 YB/3.3OV SOPN Inject 0.75 mg into the skin once a week. For weight management 2 mL 3   ergocalciferol (DRISDOL) 1.25 MG (50000 UT) capsule Take 1 capsule (50,000 Units total) by mouth once a week. 9 capsule 1   erythromycin ophthalmic ointment Place 1 application into both eyes at bedtime. 3.5 g 0   fluticasone (FLONASE) 50 MCG/ACT nasal spray Place 1 spray into both nostrils daily. 16 g 2   gabapentin (NEURONTIN) 300 MG capsule Take 1 capsule (300 mg total) by mouth 3 (three) times daily. 90 capsule 1   lisinopril-hydrochlorothiazide (ZESTORETIC) 20-25 MG tablet TAKE 1 TABLET BY MOUTH DAILY. 30 tablet 6   meclizine (ANTIVERT) 25 MG tablet Take 1 tablet (25 mg total) by mouth 3 (three) times daily as needed for dizziness. 60 tablet 3   meloxicam (MOBIC) 7.5 MG tablet Take 1  tablet (7.5 mg total) by mouth daily. 30 tablet 1   methocarbamol (ROBAXIN) 500 MG tablet Take 1 tablet (500 mg total) 3 (three) times daily by mouth. X 10 days then prn (Patient taking differently: Take 500 mg by mouth every 8 (eight) hours as needed for muscle spasms.) 120 tablet 0   metroNIDAZOLE (METROGEL VAGINAL) 0.75 % vaginal gel Place 1 Applicatorful vaginally at bedtime. 70 g 0   Multiple Vitamin (MULTIVITAMIN WITH MINERALS) TABS tablet Take 1 tablet by mouth daily.     olopatadine (PATANOL) 0.1 % ophthalmic solution Place 1 drop into both eyes 2 (two) times daily. 5 mL 12   polyethylene glycol powder (GLYCOLAX/MIRALAX) 17 GM/SCOOP powder Take 17 g by mouth daily. 3350 g 1   Probiotic Product (PROBIOTIC PO) Take 1 capsule by mouth daily.     sulfamethoxazole-trimethoprim (BACTRIM DS) 800-160 MG tablet Take 1 tablet by mouth 2 (two) times daily. 10 tablet 0   traMADol (ULTRAM) 50 MG tablet Take 1-2 tablets (50-100 mg total) by mouth every 6 (six) hours as needed. 15 tablet 0   No facility-administered medications prior to visit.    Allergies  Allergen Reactions   Acetaminophen Other (See Comments)    Jittery and upset stomach   Aspirin Nausea And Vomiting   Codeine     Jittery and upset stomach   Oxycodone-Acetaminophen     Jittery and upset stomach   Vicodin [Hydrocodone-Acetaminophen]     Jittery and upset stomach    ROS Review of Systems    Objective:    Physical Exam  LMP 11/19/2016  Wt Readings from Last 3 Encounters:  10/21/21 204 lb 4 oz (92.6 kg)  09/02/21 202 lb 11.2 oz (91.9 kg)  07/21/21 194 lb 9.6 oz (88.3 kg)     Health Maintenance Due  Topic Date Due   COVID-19 Vaccine (1) Never done   Pneumococcal Vaccine 78-73 Years old (1 - PCV) Never done   Zoster Vaccines- Shingrix (1 of 2) Never done   COLONOSCOPY (Pts 45-60yr Insurance coverage will need to be confirmed)  07/13/2016    There are no preventive care reminders to display  for this patient.  Lab Results  Component Value Date   TSH 0.958 04/09/2020   Lab Results  Component Value Date   WBC 5.7 04/09/2020   HGB 13.0 04/09/2020   HCT 38.5 04/09/2020   MCV 93 04/09/2020   PLT 345 04/09/2020   Lab Results  Component Value Date   NA 141 07/21/2021   K 4.1 07/21/2021   CO2 27 07/21/2021  GLUCOSE 88 07/21/2021   BUN 13 07/21/2021   CREATININE 1.13 (H) 07/21/2021   BILITOT 0.2 07/21/2021   ALKPHOS 98 07/21/2021   AST 23 07/21/2021   ALT 26 07/21/2021   PROT 7.8 07/21/2021   ALBUMIN 4.5 07/21/2021   CALCIUM 9.8 07/21/2021   ANIONGAP 9 05/12/2019   EGFR 59 (L) 07/21/2021   GFR 101.03 12/24/2018   Lab Results  Component Value Date   CHOL 183 10/23/2019   Lab Results  Component Value Date   HDL 42 10/23/2019   Lab Results  Component Value Date   LDLCALC 121 (H) 10/23/2019   Lab Results  Component Value Date   TRIG 112 10/23/2019   Lab Results  Component Value Date   CHOLHDL 4.4 10/23/2019   Lab Results  Component Value Date   HGBA1C 5.5 07/21/2021      Assessment & Plan:   Problem List Items Addressed This Visit   None   No orders of the defined types were placed in this encounter.   Follow-up: No follow-ups on file.    Asencion Noble, MD

## 2021-12-07 ENCOUNTER — Other Ambulatory Visit: Payer: Self-pay

## 2021-12-07 ENCOUNTER — Ambulatory Visit: Payer: Self-pay | Attending: Critical Care Medicine | Admitting: Critical Care Medicine

## 2021-12-07 ENCOUNTER — Encounter: Payer: Self-pay | Admitting: Critical Care Medicine

## 2021-12-07 VITALS — BP 124/83 | HR 75 | Resp 16 | Wt 201.8 lb

## 2021-12-07 DIAGNOSIS — M5440 Lumbago with sciatica, unspecified side: Secondary | ICD-10-CM

## 2021-12-07 DIAGNOSIS — H8113 Benign paroxysmal vertigo, bilateral: Secondary | ICD-10-CM

## 2021-12-07 DIAGNOSIS — H6120 Impacted cerumen, unspecified ear: Secondary | ICD-10-CM

## 2021-12-07 DIAGNOSIS — Z1211 Encounter for screening for malignant neoplasm of colon: Secondary | ICD-10-CM

## 2021-12-07 DIAGNOSIS — H65193 Other acute nonsuppurative otitis media, bilateral: Secondary | ICD-10-CM

## 2021-12-07 DIAGNOSIS — E892 Postprocedural hypoparathyroidism: Secondary | ICD-10-CM

## 2021-12-07 DIAGNOSIS — I10 Essential (primary) hypertension: Secondary | ICD-10-CM

## 2021-12-07 DIAGNOSIS — E669 Obesity, unspecified: Secondary | ICD-10-CM

## 2021-12-07 MED ORDER — LISINOPRIL-HYDROCHLOROTHIAZIDE 20-25 MG PO TABS
1.0000 | ORAL_TABLET | Freq: Every day | ORAL | 6 refills | Status: DC
  Filled 2021-12-07 – 2022-03-03 (×2): qty 30, 30d supply, fill #0
  Filled 2022-04-11: qty 30, 30d supply, fill #1
  Filled 2022-05-20: qty 90, 90d supply, fill #2

## 2021-12-07 MED ORDER — TRULICITY 0.75 MG/0.5ML ~~LOC~~ SOAJ
0.7500 mg | SUBCUTANEOUS | 3 refills | Status: DC
  Filled 2021-12-07 – 2021-12-16 (×2): qty 2, 28d supply, fill #0
  Filled 2022-01-12: qty 2, 28d supply, fill #1

## 2021-12-07 MED ORDER — CYCLOBENZAPRINE HCL 10 MG PO TABS
10.0000 mg | ORAL_TABLET | Freq: Three times a day (TID) | ORAL | 1 refills | Status: DC | PRN
Start: 2021-12-07 — End: 2022-12-27
  Filled 2021-12-07 – 2021-12-16 (×2): qty 30, 10d supply, fill #0

## 2021-12-07 MED ORDER — FLUTICASONE PROPIONATE 50 MCG/ACT NA SUSP
1.0000 | Freq: Every day | NASAL | 2 refills | Status: DC
  Filled 2021-12-07 – 2021-12-16 (×2): qty 16, 30d supply, fill #0

## 2021-12-07 MED ORDER — DEBROX 6.5 % OT SOLN
5.0000 [drp] | Freq: Two times a day (BID) | OTIC | 0 refills | Status: AC
  Filled 2021-12-07: qty 15, 30d supply, fill #0

## 2021-12-07 MED ORDER — MECLIZINE HCL 25 MG PO TABS
25.0000 mg | ORAL_TABLET | Freq: Three times a day (TID) | ORAL | 3 refills | Status: DC | PRN
  Filled 2021-12-07 – 2021-12-16 (×2): qty 60, 20d supply, fill #0
  Filled 2022-04-11: qty 60, 20d supply, fill #1

## 2021-12-07 NOTE — Assessment & Plan Note (Signed)
Exam was grossly normal, with negative straight-leg raise. Mobility is within normal limits. Strength is normal. Muscle tightness was noted throughout the back.   At this time, it does not appear that further imaging is warranted. Films done in 2014 were normal. Prescription provided for cyclobenzaprine to help with muscle spasms/tightness. Referral to physical therapy made for evaluation and to provide exercises to help with symptom management.

## 2021-12-07 NOTE — Assessment & Plan Note (Signed)
A mild amount of wax was noted in the right ear, and a moderate amount of wax was noted in the left ear. TM visualized in the right ear, but was partially blocked in the left ear due to a large amount of non-impacted wax.  Patient provided with an Rx for Debrox (carbamide peroxide kit) and instructed on how to use. Education provided on avoiding sticking any objects in the ear. Instructed patient to restart Flonase (fluticasone spray) as this may help with symptoms of allergic rhinitis, including itchy ears.

## 2021-12-07 NOTE — Progress Notes (Signed)
Established Patient Office Visit  Subjective:  Patient ID: Katelyn Hardin, female    DOB: 28-Feb-1971  Age: 51 y.o. MRN: [id number]  CC:  Chief Complaint  Patient presents with   Back Pain    HPI Katelyn Hardin presents for back pain. She says she has had this issue for many years now, but she has noticed it worsening in the past 2 or so years. She first noticed the worsening in symptoms when she began experiencing pain on her left side while trying to sleep. She describes the pain as a dull, deep, aching sensation in her lower back that sometimes spreads to her legs. This has impacted her ability to get a good night's sleep considerably. She also notices the same aching sensation bilaterally when she is more active and doing activities such as walking, mopping, and chores around the house. 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 She also experiences pain when she goes from sitting to standing position.   The patient admits a history of mild scoliosis. She also states that many years ago, she was diagnosed with sciatica at an ER visit. She is unsure if this diagnosis is correct, and she is concerned that the issue has continued for so long. The patient states she has not taken any medications for this pain. Lumbar xray in 2014 was normal.  The patient also has complaint of left ear itching and fullness. She states that it feels like water is stuck in the ear. She admits to sticking Q-tips and bobby pins in the ear to help with the itching sensation and this does provide temporary relief. She denies dizziness, tinnitus, or loss of hearing. She does have a history of vertigo, and experiences occasional light-headedness but she believes this issue is separate from that.  She is due for colon cancer screening, and needs refills on some of her other medications at this time.    Past Medical History:  Diagnosis  Date   Anemia    Bacterial vaginosis    Complication of anesthesia    Slow to wake up   Family history of breast cancer    Fibroids    History of hiatal hernia    History of kidney stones    Hyperparathyroidism ([place])    Hypertension    IBS 03/10/2008   Qualifier: Diagnosis of  By: Hassell Done FNP, [place]     IBS (irritable bowel syndrome)    Menopausal hot flushes 07/07/2014    Past Surgical History:  Procedure Laterality Date   PARATHYROIDECTOMY N/A 05/09/2019   Procedure: PARATHYROIDECTOMY;  Surgeon: Armandina Gemma, MD;  Location: [facility];  Service: General;  Laterality: N/A;   TUBAL LIGATION     TUBAL LIGATION      Family History  Problem Relation Age of Onset   Cervical cancer Mother    Thyroid disease Sister    Breast cancer Sister 89   Cancer Maternal Grandfather        unknown   Breast cancer Other        dx in her 37s; MGM's sister   Breast cancer Other        45 of MGM sisters   Diabetes Neg Hx     Social History   Socioeconomic History   Marital status: Married    Spouse name: Not on file   Number of children: 2   Years of education: Not on file   Highest education level:  Associate degree: occupational, technical, or vocational program  Occupational History   Not on file  Tobacco Use   Smoking status: Never   Smokeless tobacco: Never  Vaping Use   Vaping Use: Never used  Substance and Sexual Activity   Alcohol use: Yes    Alcohol/week: 6.0 standard drinks    Types: 2 Glasses of wine, 4 Standard drinks or equivalent per week    Comment: 2 glasses of wine daily   Drug use: No   Sexual activity: Yes    Birth control/protection: Post-menopausal  Other Topics Concern   Not on file  Social History Narrative   Not on file   Social Determinants of Health   Financial Resource Strain: Not on file  Food Insecurity: No Food Insecurity   Worried About Running Out of Food in the Last Year: Never true   Ran Out of Food in the Last Year: Never true   Transportation Needs: No Transportation Needs   Lack of Transportation (Medical): No   Lack of Transportation (Non-Medical): No  Physical Activity: Not on file  Stress: Not on file  Social Connections: Not on file  Intimate Partner Violence: Not on file    Outpatient Medications Prior to Visit  Medication Sig Dispense Refill   clotrimazole-betamethasone (LOTRISONE) cream APPLY 1 APPLICATION TOPICALLY 2 TIMES DAILY AS NEEDED (RASH). 45 g 1   Fluocinolone Acetonide Body 0.01 % OIL Apply to scalp 3-5x weekly as needed for itch/scaling     Multiple Vitamin (MULTIVITAMIN WITH MINERALS) TABS tablet Take 1 tablet by mouth daily.     polyethylene glycol powder (GLYCOLAX/MIRALAX) 17 GM/SCOOP powder Take 17 g by mouth daily. 3350 g 1   Probiotic Product (PROBIOTIC PO) Take 1 capsule by mouth daily.     Dulaglutide (TRULICITY) 9.67 RF/1.6BW SOPN Inject 0.75 mg into the skin once a week. For weight management 2 mL 3   lisinopril-hydrochlorothiazide (ZESTORETIC) 20-25 MG tablet TAKE 1 TABLET BY MOUTH DAILY. 30 tablet 6   erythromycin ophthalmic ointment Place 1 application into both eyes at bedtime. (Patient not taking: Reported on 12/07/2021) 3.5 g 0   hydrocortisone 2.5 % ointment Apply to areas around nose/eyelids/eyebrows daily as needed (Patient not taking: Reported on 12/07/2021)     calcium gluconate 500 MG tablet Take 1 tablet (500 mg total) by mouth 2 (two) times daily. (Patient not taking: Reported on 12/07/2021) 60 tablet 2   cephALEXin (KEFLEX) 500 MG capsule Take 1 capsule (500 mg total) by mouth 2 (two) times daily. (Patient not taking: Reported on 12/07/2021) 6 capsule 0   cetirizine (ZYRTEC) 10 MG tablet Take 1 tablet (10 mg total) by mouth daily. (Patient not taking: Reported on 12/07/2021) 30 tablet 3   cloNIDine (CATAPRES) 0.1 MG tablet Take 1 tablet (0.1 mg total) by mouth daily. For hot flashes (Patient not taking: Reported on 12/07/2021) 90 tablet 1   ergocalciferol (DRISDOL) 1.25 MG  (50000 UT) capsule Take 1 capsule (50,000 Units total) by mouth once a week. (Patient not taking: Reported on 12/07/2021) 9 capsule 1   fluticasone (FLONASE) 50 MCG/ACT nasal spray Place 1 spray into both nostrils daily. (Patient not taking: Reported on 12/07/2021) 16 g 2   gabapentin (NEURONTIN) 300 MG capsule Take 1 capsule (300 mg total) by mouth 3 (three) times daily. (Patient not taking: Reported on 12/07/2021) 90 capsule 1   meclizine (ANTIVERT) 25 MG tablet Take 1 tablet (25 mg total) by mouth 3 (three) times daily as needed for dizziness. (Patient not  taking: Reported on 12/07/2021) 60 tablet 3   meloxicam (MOBIC) 7.5 MG tablet Take 1 tablet (7.5 mg total) by mouth daily. (Patient not taking: Reported on 12/07/2021) 30 tablet 1   methocarbamol (ROBAXIN) 500 MG tablet Take 1 tablet (500 mg total) 3 (three) times daily by mouth. X 10 days then prn (Patient taking differently: Take 500 mg by mouth every 8 (eight) hours as needed for muscle spasms.) 120 tablet 0   metroNIDAZOLE (METROGEL VAGINAL) 0.75 % vaginal gel Place 1 Applicatorful vaginally at bedtime. (Patient not taking: Reported on 12/07/2021) 70 g 0   olopatadine (PATANOL) 0.1 % ophthalmic solution Place 1 drop into both eyes 2 (two) times daily. (Patient not taking: Reported on 12/07/2021) 5 mL 12   sulfamethoxazole-trimethoprim (BACTRIM DS) 800-160 MG tablet Take 1 tablet by mouth 2 (two) times daily. (Patient not taking: Reported on 12/07/2021) 10 tablet 0   traMADol (ULTRAM) 50 MG tablet Take 1-2 tablets (50-100 mg total) by mouth every 6 (six) hours as needed. (Patient not taking: Reported on 12/07/2021) 15 tablet 0   No facility-administered medications prior to visit.    Allergies  Allergen Reactions   Acetaminophen Other (See Comments)    Jittery and upset stomach   Aspirin Nausea And Vomiting   Codeine     Jittery and upset stomach   Oxycodone-Acetaminophen     Jittery and upset stomach   Vicodin [Hydrocodone-Acetaminophen]      Jittery and upset stomach    ROS Review of Systems  Constitutional:  Negative for fatigue.  HENT:  Negative for hearing loss, rhinorrhea and tinnitus.   Respiratory:  Positive for cough. Negative for shortness of breath.   Gastrointestinal:  Negative for diarrhea, nausea and vomiting.  Endocrine: Negative.   Genitourinary: Negative.   Musculoskeletal:  Positive for back pain. Negative for gait problem and neck pain.  Skin: Negative.   Neurological:  Positive for light-headedness. Negative for syncope.  Psychiatric/Behavioral:  Positive for sleep disturbance.      Objective:    Physical Exam Constitutional:      Appearance: Normal appearance.  HENT:     Head: Normocephalic and atraumatic.     Right Ear: Tympanic membrane, ear canal and external ear normal. There is no impacted cerumen.     Left Ear: Tympanic membrane, ear canal and external ear normal. There is impacted cerumen (profuse wax, not impacted).  Cardiovascular:     Rate and Rhythm: Normal rate and regular rhythm.     Heart sounds: No murmur heard.   No friction rub. No gallop.  Pulmonary:     Effort: Pulmonary effort is normal.     Breath sounds: Normal breath sounds. No wheezing, rhonchi or rales.  Musculoskeletal:     Cervical back: Normal.     Thoracic back: No swelling or deformity. Scoliosis present.     Lumbar back: Normal. No swelling, edema or tenderness. Negative right straight leg raise test and negative left straight leg raise test. No scoliosis.     Right lower leg: No edema.     Left lower leg: No edema.  Skin:    General: Skin is warm and dry.  Neurological:     Mental Status: She is alert and oriented to person, place, and time.     Motor: No weakness or atrophy.     Gait: Gait normal.  Psychiatric:        Mood and Affect: Mood normal.        Behavior: Behavior normal.  Thought Content: Thought content normal.        Judgment: Judgment normal.    BP 124/83    Pulse 75    Resp 16     Wt 201 lb 12.8 oz (91.5 kg)    LMP 11/19/2016    SpO2 99%    BMI 32.57 kg/m  Wt Readings from Last 3 Encounters:  12/07/21 201 lb 12.8 oz (91.5 kg)  10/21/21 204 lb 4 oz (92.6 kg)  09/02/21 202 lb 11.2 oz (91.9 kg)     Health Maintenance Due  Topic Date Due   COVID-19 Vaccine (1) Never done   Pneumococcal Vaccine 51-23 Years old (1 - PCV) Never done   Zoster Vaccines- Shingrix (1 of 2) Never done   COLONOSCOPY (Pts 45-67yr Insurance coverage will need to be confirmed)  07/13/2016    There are no preventive care reminders to display for this patient.  Lab Results  Component Value Date   TSH 0.958 04/09/2020   Lab Results  Component Value Date   WBC 5.7 04/09/2020   HGB 13.0 04/09/2020   HCT 38.5 04/09/2020   MCV 93 04/09/2020   PLT 345 04/09/2020   Lab Results  Component Value Date   NA 141 07/21/2021   K 4.1 07/21/2021   CO2 27 07/21/2021   GLUCOSE 88 07/21/2021   BUN 13 07/21/2021   CREATININE 1.13 (H) 07/21/2021   BILITOT 0.2 07/21/2021   ALKPHOS 98 07/21/2021   AST 23 07/21/2021   ALT 26 07/21/2021   PROT 7.8 07/21/2021   ALBUMIN 4.5 07/21/2021   CALCIUM 9.8 07/21/2021   ANIONGAP 9 05/12/2019   EGFR 59 (L) 07/21/2021   GFR 101.03 12/24/2018   Lab Results  Component Value Date   CHOL 183 10/23/2019   Lab Results  Component Value Date   HDL 42 10/23/2019   Lab Results  Component Value Date   LDLCALC 121 (H) 10/23/2019   Lab Results  Component Value Date   TRIG 112 10/23/2019   Lab Results  Component Value Date   CHOLHDL 4.4 10/23/2019   Lab Results  Component Value Date   HGBA1C 5.5 07/21/2021      Assessment & Plan:   Problem List Items Addressed This Visit       Cardiovascular and Mediastinum   HYPERTENSION, BENIGN ESSENTIAL    BP at goal. Refill of lisinopril-HCTZ sent to pharmacy.       Relevant Medications   lisinopril-hydrochlorothiazide (ZESTORETIC) 20-25 MG tablet     Nervous and Auditory   Acute bilateral low back  pain with sciatica - Primary    Exam was grossly normal, with negative straight-leg raise. Mobility is within normal limits. Strength is normal. Muscle tightness was noted throughout the back.   At this time, it does not appear that further imaging is warranted. Films done in 2014 were normal. Prescription provided for cyclobenzaprine to help with muscle spasms/tightness. Referral to physical therapy made for evaluation and to provide exercises to help with symptom management.       Relevant Medications   cyclobenzaprine (FLEXERIL) 10 MG tablet   Other Relevant Orders   Ambulatory referral to Physical Therapy   RESOLVED: Benign paroxysmal positional vertigo, bilateral    Refill for meclizine sent to pharmacy.       Relevant Medications   meclizine (ANTIVERT) 25 MG tablet     Other   Cerumen in auditory canal on examination    A mild amount of wax was noted in  the right ear, and a moderate amount of wax was noted in the left ear. TM visualized in the right ear, but was partially blocked in the left ear due to a large amount of non-impacted wax.  Patient provided with an Rx for Debrox (carbamide peroxide kit) and instructed on how to use. Education provided on avoiding sticking any objects in the ear. Instructed patient to restart Flonase (fluticasone spray) as this may help with symptoms of allergic rhinitis, including itchy ears.       Relevant Medications   fluticasone (FLONASE) 50 MCG/ACT nasal spray   Mild obesity    Refill for Trulicity (Dulaglutide) for weight management sent to pharmacy.       Relevant Medications   Dulaglutide (TRULICITY) 9.97 FS/1.4EL SOPN   Colon cancer screening    FOBT ordered for patient to complete.      Relevant Orders   Fecal occult blood, imunochemical   Other Visit Diagnoses     Acute MEE (middle ear effusion), bilateral       Relevant Medications   fluticasone (FLONASE) 50 MCG/ACT nasal spray   H/O parathyroidectomy (HCC)   (Chronic)          Meds ordered this encounter  Medications   Dulaglutide (TRULICITY) 9.53 UY/2.3XI SOPN    Sig: Inject 0.75 mg into the skin once a week. For weight management    Dispense:  2 mL    Refill:  3   fluticasone (FLONASE) 50 MCG/ACT nasal spray    Sig: Place 1 spray into both nostrils daily.    Dispense:  16 g    Refill:  2   lisinopril-hydrochlorothiazide (ZESTORETIC) 20-25 MG tablet    Sig: TAKE 1 TABLET BY MOUTH DAILY.    Dispense:  30 tablet    Refill:  6   meclizine (ANTIVERT) 25 MG tablet    Sig: Take 1 tablet (25 mg total) by mouth 3 (three) times daily as needed for dizziness.    Dispense:  60 tablet    Refill:  3   cyclobenzaprine (FLEXERIL) 10 MG tablet    Sig: Take 1 tablet (10 mg total) by mouth 3 (three) times daily as needed for muscle spasms.    Dispense:  30 tablet    Refill:  1   carbamide peroxide (DEBROX) 6.5 % OTIC solution    Sig: Place 5 drops into both ears 2 (two) times daily.    Dispense:  15 mL    Refill:  0    Follow-up: Return in about 2 months (around 02/04/2022).    Asencion Noble, MD

## 2021-12-07 NOTE — Patient Instructions (Signed)
Trial cyclobenzaprine three times daily as needed for back spasme   Referral to physical therapy made  See back exercises attached  Refills on medications sent to the pharmacy  Return Dr Margarita Rana in two months  Please apply for orange card

## 2021-12-07 NOTE — Assessment & Plan Note (Signed)
Refill for Trulicity (Dulaglutide) for weight management sent to pharmacy.

## 2021-12-07 NOTE — Assessment & Plan Note (Signed)
BP at goal. Refill of lisinopril-HCTZ sent to pharmacy.

## 2021-12-07 NOTE — Assessment & Plan Note (Signed)
FOBT ordered for patient to complete.

## 2021-12-07 NOTE — Assessment & Plan Note (Signed)
Refill for meclizine sent to pharmacy.

## 2021-12-10 ENCOUNTER — Other Ambulatory Visit: Payer: Self-pay

## 2021-12-14 ENCOUNTER — Encounter: Payer: Self-pay | Admitting: Family Medicine

## 2021-12-14 ENCOUNTER — Other Ambulatory Visit: Payer: Self-pay

## 2021-12-14 ENCOUNTER — Ambulatory Visit (INDEPENDENT_AMBULATORY_CARE_PROVIDER_SITE_OTHER): Payer: Self-pay | Admitting: Family Medicine

## 2021-12-14 VITALS — BP 127/75 | HR 71 | Wt 200.0 lb

## 2021-12-14 DIAGNOSIS — R232 Flushing: Secondary | ICD-10-CM

## 2021-12-14 DIAGNOSIS — F39 Unspecified mood [affective] disorder: Secondary | ICD-10-CM

## 2021-12-14 DIAGNOSIS — N951 Menopausal and female climacteric states: Secondary | ICD-10-CM

## 2021-12-14 DIAGNOSIS — Z780 Asymptomatic menopausal state: Secondary | ICD-10-CM

## 2021-12-14 MED ORDER — ESCITALOPRAM OXALATE 10 MG PO TABS
10.0000 mg | ORAL_TABLET | Freq: Every day | ORAL | 0 refills | Status: DC
  Filled 2021-12-14: qty 30, 30d supply, fill #0

## 2021-12-14 NOTE — Progress Notes (Signed)
Patient reports symptoms of menopause. She complains of vaginal dryness, low libido, painful sex and hot flashes.  She stated that she was taking a natural supplement called "steel libido" for about a month but "nothing workedChief Strategy Officer

## 2021-12-14 NOTE — Progress Notes (Signed)
GYNECOLOGY OFFICE VISIT NOTE  History:  51 y.o. [id number] here today for hot flashes and vaginal dryness in setting of menopause. She denies any abnormal vaginal discharge, bleeding, pelvic pain or other concerns.   #Menopause Last period 2 years ago Most bothersome is low libido Vagianl dryness Also having hot flashes Use lubricant for sex (astroglide) Also feels like not herself and feels like she is not happy even though her life is good  Family history of breast cancer - half sister, genetic testing for sister was inconclusive Patient did genetic testing for cancer but did not get results  #Hot flashes Everyday Better since a couple of years ago But still bothersome Sleeps with a fan Has not tried medications Interested in SSRI Also considering HRT but hesitant given family hx of cancer   #Vaginal dryness Sometimes using lubricant Not helping Does not usually engage in foreplay   The following portions of the patient's history were reviewed and updated as appropriate: allergies, current medications, past family history, past medical history, past social history, past surgical history and problem list.   Health Maintenance:  Normal pap and negative HRHPV on 10/23/2019.  Normal mammogram on 08/2021.   Review of Systems:  Pertinent items noted in HPI Review of Systems  Constitutional:  Negative for chills and fever.  Gastrointestinal: Negative.   Genitourinary: Negative.   Neurological:  Negative for weakness and headaches.   Objective:  Physical Exam BP 127/75    Pulse 71    Wt 200 lb (90.7 kg)    LMP 11/19/2016    BMI 32.28 kg/m  Physical Exam Vitals and nursing note reviewed.  Constitutional:      Appearance: Normal appearance. She is not ill-appearing.  HENT:     Head: Normocephalic.  Cardiovascular:     Rate and Rhythm: Normal rate.     Pulses: Normal pulses.  Pulmonary:     Effort: Pulmonary effort is normal.  Abdominal:     General: Abdomen is  flat.  Neurological:     Mental Status: She is alert.    Labs and Imaging No results found for this or any previous visit (from the past 168 hour(s)). No results found.  Assessment & Plan:  Menopause Hot flashes Feeling down Patient presenting to establish care and to discuss her bothersome menopause symptoms. Patient reports hot flashes for the past few years and would like to try treatment. We discussed options including HRT and SSRIs and clonidine and gabapentin. Avoid clonidine given her hx of HTN on meds and since risk of rebound HTN if stopping clonidine. Given also having some symptoms of feeling down and not happy will plan on starting SSRI for mood benefit in addition to vasomotor symptom relief. Will trial Lexapro given lower side effect profile. Also offered [facility] for coping with symptoms as well as all the changes that have happened in menopause. Patient would like to try [facility]. Discussed can see [place] provider at her PCP office or her and patient would like to try seeing [facility] provider here -Lexapro 10mg  Daily and follow up in 4-6 weeks. If inadequate symptom relief can go to Lexapro 20mg  - [place] referral placed.   3. Vaginal dryness 4. Dyspareunia Patient with vaginal dryness, pain with intercourse and also related low desire for intercourse. Has tried lubricant sporadically however not consistently. Also has not tried consistent use of foreplay prior to intercourse. We discussed using lubricant and even using lubricant daily for moisturizing purposes and not just related to intercourse.  Also discussed options of HRT including vaginal estrogen but given family hx of breast cancer cautioned regarding risks. - validated patient's concerns given discomfort of her symptoms - Patient to try to find results of the genetic testing she had done - trial regular lubricant use - followup in 4-6 weeks   Return in about 6 weeks (around 01/25/2022), or Follow up menopause.    Total face-to-face time  with patient: 30 minutes.  Over 50% of encounter was spent on counseling and coordination of care.  Renard Matter, MD, MPH OB Fellow, [place] for [facility], [place]

## 2021-12-15 ENCOUNTER — Telehealth: Payer: Self-pay | Admitting: Clinical

## 2021-12-15 ENCOUNTER — Other Ambulatory Visit: Payer: Self-pay

## 2021-12-15 NOTE — Telephone Encounter (Signed)
Intern Delana Meyer called pt to inquire about receiving behavioral health services. Pt is still undecided if she would like to begin services and says she will call back if she decides to.

## 2021-12-16 ENCOUNTER — Other Ambulatory Visit: Payer: Self-pay

## 2021-12-22 NOTE — BH Specialist Note (Unsigned)
error 

## 2022-01-12 ENCOUNTER — Other Ambulatory Visit: Payer: Self-pay

## 2022-01-17 ENCOUNTER — Other Ambulatory Visit: Payer: Self-pay

## 2022-01-17 ENCOUNTER — Ambulatory Visit: Payer: Self-pay | Attending: Family Medicine | Admitting: Family Medicine

## 2022-01-17 VITALS — BP 131/88 | HR 67 | Ht 66.0 in | Wt 201.0 lb

## 2022-01-17 DIAGNOSIS — E669 Obesity, unspecified: Secondary | ICD-10-CM

## 2022-01-17 DIAGNOSIS — I10 Essential (primary) hypertension: Secondary | ICD-10-CM

## 2022-01-17 DIAGNOSIS — H6123 Impacted cerumen, bilateral: Secondary | ICD-10-CM

## 2022-01-17 DIAGNOSIS — Z13228 Encounter for screening for other metabolic disorders: Secondary | ICD-10-CM

## 2022-01-17 MED ORDER — TRULICITY 1.5 MG/0.5ML ~~LOC~~ SOAJ
1.5000 mg | SUBCUTANEOUS | 6 refills | Status: DC
  Filled 2022-01-17: qty 2, 28d supply, fill #0
  Filled 2022-03-03: qty 2, 28d supply, fill #1

## 2022-01-17 NOTE — Progress Notes (Signed)
Discuss trulicity medication.

## 2022-01-17 NOTE — Progress Notes (Signed)
Subjective:  Patient ID: Katelyn Hardin, female    DOB: 1970-11-30  Age: 51 y.o. MRN: [id number]  CC: Hypertension   HPI Katelyn Hardin is a 51 y.o. year old female with a history of hypertension, vertigo, sciatica, hyperparathyroidism status post parathyroidectomy in 04/2019 seen for a follow-up visit.  Interval History: At her last visit she was placed on Trulicity for weight loss but she has not noticed any decrease in appetite or weight loss.  She does walk but has not been consistent and has not been measuring her walking distance.  I had also referred her to the medical weight management clinic but she states she could not afford this.  She informs me that she likes food.  Complains of ears being clogged and has used OTC ceruminolytic's with mild improvement. Endorses compliance with her antihypertensive and other chronic medications. Past Medical History:  Diagnosis Date   Anemia    Bacterial vaginosis    Complication of anesthesia    Slow to wake up   Family history of breast cancer    Fibroids    History of hiatal hernia    History of kidney stones    Hyperparathyroidism ([place])    Hypertension    IBS 03/10/2008   Qualifier: Diagnosis of  By: Hassell Done FNP, [place]     IBS (irritable bowel syndrome)    Menopausal hot flushes 07/07/2014    Past Surgical History:  Procedure Laterality Date   PARATHYROIDECTOMY N/A 05/09/2019   Procedure: PARATHYROIDECTOMY;  Surgeon: Armandina Gemma, MD;  Location: [facility];  Service: General;  Laterality: N/A;   TUBAL LIGATION     TUBAL LIGATION      Family History  Problem Relation Age of Onset   Cervical cancer Mother    Thyroid disease Sister    Breast cancer Sister 33   Cancer Maternal Grandfather        unknown   Breast cancer Other        dx in her 38s; MGM's sister   Breast cancer Other        15 of MGM sisters   Diabetes Neg Hx     Allergies  Allergen Reactions   Acetaminophen Other (See  Comments)    Jittery and upset stomach   Aspirin Nausea And Vomiting   Codeine     Jittery and upset stomach   Oxycodone-Acetaminophen     Jittery and upset stomach   Vicodin [Hydrocodone-Acetaminophen]     Jittery and upset stomach    Outpatient Medications Prior to Visit  Medication Sig Dispense Refill   cyclobenzaprine (FLEXERIL) 10 MG tablet Take 1 tablet (10 mg total) by mouth 3 (three) times daily as needed for muscle spasms. 30 tablet 1   escitalopram (LEXAPRO) 10 MG tablet Take 1 tablet (10 mg total) by mouth daily. Start with half a tablet once a day and if tolerating without side effect go to full tablet. 60 tablet 0   fluticasone (FLONASE) 50 MCG/ACT nasal spray Place 1 spray into both nostrils daily. 16 g 2   lisinopril-hydrochlorothiazide (ZESTORETIC) 20-25 MG tablet TAKE 1 TABLET BY MOUTH DAILY. 30 tablet 6   meclizine (ANTIVERT) 25 MG tablet Take 1 tablet (25 mg total) by mouth 3 (three) times daily as needed for dizziness. 60 tablet 3   Multiple Vitamin (MULTIVITAMIN WITH MINERALS) TABS tablet Take 1 tablet by mouth daily.     polyethylene glycol powder (GLYCOLAX/MIRALAX) 17 GM/SCOOP powder Take 17 g by mouth daily. 3350 g  1   Probiotic Product (PROBIOTIC PO) Take 1 capsule by mouth daily.     Dulaglutide (TRULICITY) 7.84 ON/6.2XB SOPN Inject 0.75 mg into the skin once a week. For weight management 2 mL 3   carbamide peroxide (DEBROX) 6.5 % OTIC solution Place 5 drops into both ears 2 (two) times daily. 15 mL 0   erythromycin ophthalmic ointment Place 1 application into both eyes at bedtime. (Patient not taking: Reported on 12/14/2021) 3.5 g 0   Fluocinolone Acetonide Body 0.01 % OIL Apply to scalp 3-5x weekly as needed for itch/scaling (Patient not taking: Reported on 01/17/2022)     hydrocortisone 2.5 % ointment Apply to areas around nose/eyelids/eyebrows daily as needed (Patient not taking: Reported on 12/14/2021)     No facility-administered medications prior to visit.      ROS Review of Systems  Constitutional:  Negative for activity change, appetite change and fatigue.  HENT:  Negative for congestion, sinus pressure and sore throat.   Eyes:  Negative for visual disturbance.  Respiratory:  Negative for cough, chest tightness, shortness of breath and wheezing.   Cardiovascular:  Negative for chest pain and palpitations.  Gastrointestinal:  Negative for abdominal distention, abdominal pain and constipation.  Endocrine: Negative for polydipsia.  Genitourinary:  Negative for dysuria and frequency.  Musculoskeletal:  Negative for arthralgias and back pain.  Skin:  Negative for rash.  Neurological:  Negative for tremors, light-headedness and numbness.  Hematological:  Does not bruise/bleed easily.  Psychiatric/Behavioral:  Negative for agitation and behavioral problems.    Objective:  BP 131/88    Pulse 67    Ht _0  (1.676 m)    Wt 201 lb (91.2 kg)    LMP 11/19/2016    SpO2 100%    BMI 32.44 kg/m   BP/Weight 01/17/2022 12/14/2021 2/84/1324  Systolic BP 401 027 253  Diastolic BP 88 75 83  Wt. (Lbs) 201 200 201.8  BMI 32.44 32.28 32.57      Physical Exam Constitutional:      Appearance: She is well-developed.  HENT:     Right Ear: There is impacted cerumen.     Left Ear: There is impacted cerumen.  Cardiovascular:     Rate and Rhythm: Normal rate.     Heart sounds: Normal heart sounds. No murmur heard. Pulmonary:     Effort: Pulmonary effort is normal.     Breath sounds: Normal breath sounds. No wheezing or rales.  Chest:     Chest wall: No tenderness.  Abdominal:     General: Bowel sounds are normal. There is no distension.     Palpations: Abdomen is soft. There is no mass.     Tenderness: There is no abdominal tenderness.  Musculoskeletal:        General: Normal range of motion.     Right lower leg: No edema.     Left lower leg: No edema.  Neurological:     Mental Status: She is alert and oriented to person, place, and time.   Psychiatric:        Mood and Affect: Mood normal.    CMP Latest Ref Rng & Units 07/21/2021 04/09/2020 10/23/2019  Glucose 65 - 99 mg/dL 88 83 90  BUN 6 - 24 mg/dL _1 Creatinine 0.57 - 1.00 mg/dL 1.13(H) 0.94 0.81  Sodium 134 - 144 mmol/L 141 140 142  Potassium 3.5 - 5.2 mmol/L 4.1 3.9 3.8  Chloride 96 - 106 mmol/L 101 100 103  CO2  20 - 29 mmol/L _0 Calcium 8.7 - 10.2 mg/dL 9.8 9.3 9.5  Total Protein 6.0 - 8.5 g/dL 7.8 7.5 7.3  Total Bilirubin 0.0 - 1.2 mg/dL 0.2 0.3 0.4  Alkaline Phos 44 - 121 IU/L 98 85 99  AST 0 - 40 IU/L _1 ALT 0 - 32 IU/L _2 Lipid Panel     Component Value Date/Time   CHOL 183 10/23/2019 1110   TRIG 112 10/23/2019 1110   HDL 42 10/23/2019 1110   CHOLHDL 4.4 10/23/2019 1110   CHOLHDL 3.6 04/04/2013 1022   VLDL 20 04/04/2013 1022   LDLCALC 121 (H) 10/23/2019 1110    CBC    Component Value Date/Time   WBC 5.7 04/09/2020 1005   WBC 8.2 05/12/2019 2110   RBC 4.14 04/09/2020 1005   RBC 3.44 (L) 05/12/2019 2110   HGB 13.0 04/09/2020 1005   HCT 38.5 04/09/2020 1005   PLT 345 04/09/2020 1005   MCV 93 04/09/2020 1005   MCH 31.4 04/09/2020 1005   MCH 31.7 05/12/2019 2110   MCHC 33.8 04/09/2020 1005   MCHC 32.2 05/12/2019 2110   RDW 13.3 04/09/2020 1005   LYMPHSABS 2.3 04/09/2020 1005   MONOABS 0.5 02/07/2014 1711   EOSABS 0.1 04/09/2020 1005   BASOSABS 0.0 04/09/2020 1005    Lab Results  Component Value Date   HGBA1C 5.5 07/21/2021    Assessment & Plan:  1. Bilateral impacted cerumen Bilateral ear irrigation performed  2. Mild obesity She has been on 0.75 mg and has noticed no difference in weight Counseled on the possible hypoglycemia that can occur with increased dose of Trulicity Trulicity dose increased Unfortunately she is unable to afford the visit to the weight management clinic - Dulaglutide (TRULICITY) 1.5 OZ/3.0QM SOPN; Inject 1.5 mg into the skin once a week.  Dispense: 2 mL; Refill: 6  3.  Essential hypertension, benign Controlled Continue antihypertensive Counseled on blood pressure goal of less than 130/80, low-sodium, DASH diet, medication compliance, 150 minutes of moderate intensity exercise per week. Discussed medication compliance, adverse effects. - CMP14+EGFR - LP+Non-HDL Cholesterol  4. Screening for metabolic disorder - Hemoglobin A1c   Meds ordered this encounter  Medications   Dulaglutide (TRULICITY) 1.5 VH/8.4ON SOPN    Sig: Inject 1.5 mg into the skin once a week.    Dispense:  2 mL    Refill:  6    Dose increase    Follow-up: No follow-ups on file.       Charlott Rakes, MD, FAAFP. [facility] and [place], [place]   01/17/2022, 12:32 PM

## 2022-01-18 LAB — LP+NON-HDL CHOLESTEROL
Cholesterol, Total: 213 mg/dL — ABNORMAL HIGH (ref 100–199)
HDL: 38 mg/dL — ABNORMAL LOW (ref >39)
LDL Chol Calc (NIH): 150 mg/dL — ABNORMAL HIGH (ref 0–99)
Total Non-HDL-Chol (LDL+VLDL): 175 mg/dL — ABNORMAL HIGH (ref 0–129)
Triglycerides: 136 mg/dL (ref 0–149)
VLDL Cholesterol Cal: 25 mg/dL (ref 5–40)

## 2022-01-18 LAB — CMP14+EGFR
ALT: 41 IU/L — ABNORMAL HIGH (ref 0–32)
AST: 31 IU/L (ref 0–40)
Albumin/Globulin Ratio: 1.4 (ref 1.2–2.2)
Albumin: 4.5 g/dL (ref 3.8–4.9)
Alkaline Phosphatase: 105 IU/L (ref 44–121)
BUN/Creatinine Ratio: 12 (ref 9–23)
BUN: 11 mg/dL (ref 6–24)
Bilirubin Total: <0.2 mg/dL (ref 0.0–1.2)
CO2: 28 mmol/L (ref 20–29)
Calcium: 9.5 mg/dL (ref 8.7–10.2)
Chloride: 103 mmol/L (ref 96–106)
Creatinine, Ser: 0.93 mg/dL (ref 0.57–1.00)
Globulin, Total: 3.2 g/dL (ref 1.5–4.5)
Glucose: 88 mg/dL (ref 70–99)
Potassium: 4.3 mmol/L (ref 3.5–5.2)
Sodium: 143 mmol/L (ref 134–144)
Total Protein: 7.7 g/dL (ref 6.0–8.5)
eGFR: 74 mL/min/{1.73_m2} (ref >59)

## 2022-01-18 LAB — HEMOGLOBIN A1C
Est. average glucose Bld gHb Est-mCnc: 108 mg/dL
Hgb A1c MFr Bld: 5.4 % (ref 4.8–5.6)

## 2022-01-20 ENCOUNTER — Other Ambulatory Visit: Payer: Self-pay

## 2022-01-21 ENCOUNTER — Encounter: Payer: Self-pay | Admitting: Family Medicine

## 2022-02-17 ENCOUNTER — Other Ambulatory Visit: Payer: Self-pay

## 2022-02-17 ENCOUNTER — Ambulatory Visit (INDEPENDENT_AMBULATORY_CARE_PROVIDER_SITE_OTHER): Payer: Self-pay | Admitting: Family Medicine

## 2022-02-17 ENCOUNTER — Other Ambulatory Visit (HOSPITAL_COMMUNITY)
Admission: RE | Admit: 2022-02-17 | Discharge: 2022-02-17 | Disposition: A | Discharge status: Home / Self Care | Payer: Self-pay | Source: Ambulatory Visit | Attending: Family Medicine | Admitting: Family Medicine

## 2022-02-17 VITALS — BP 145/89 | HR 80 | Wt 203.2 lb

## 2022-02-17 DIAGNOSIS — N898 Other specified noninflammatory disorders of vagina: Secondary | ICD-10-CM

## 2022-02-17 DIAGNOSIS — Z780 Asymptomatic menopausal state: Secondary | ICD-10-CM

## 2022-02-17 MED ORDER — ESCITALOPRAM OXALATE 10 MG PO TABS
10.0000 mg | ORAL_TABLET | Freq: Every day | ORAL | 0 refills | Status: DC
  Filled 2022-02-17: qty 30, 30d supply, fill #0
  Filled 2022-03-09 (×4): qty 60, 60d supply, fill #0

## 2022-02-17 NOTE — Progress Notes (Signed)
? ?GYNECOLOGY OFFICE VISIT NOTE ? ?History:  ?51 y.o. [id number] here today for follow up for menopause symptoms . She denies any abnormal vaginal discharge, bleeding, pelvic pain. ? ?#Hot flashes ?#Menopause ?- reports tried Lexapro briefly for 2 weeks ?- stopped soon after as concerned about potential side effects ?- discussed that she had been on Gabapentin for her sciatica before but only briefly as she was concerned about side effects ?- brought in genetic testing records for family history of cancers (in order to identify risks to discuss HRT if warranted in the future) ?- reports hot flashes most bothersome ? ?#Vaginal odor ?Thinks it might be on the outside ?No change in discharge ?No urinary symptoms ? ? ?Review of Systems:  ?Pertinent items noted in HPI ?Review of Systems  ?All other systems reviewed and are negative. ? ?Objective:  ?Physical Exam ?BP (!) 145/89   Pulse 80   Wt 203 lb 3.2 oz (92.2 kg)   LMP 11/19/2016   BMI 32.80 kg/m?  ?Physical Exam ?Vitals and nursing note reviewed.  ?HENT:  ?   Head: Normocephalic.  ?Cardiovascular:  ?   Rate and Rhythm: Normal rate.  ?Pulmonary:  ?   Effort: Pulmonary effort is normal.  ?Abdominal:  ?   General: Abdomen is flat. There is no distension.  ?Musculoskeletal:     ?   General: Normal range of motion.  ?   Cervical back: Normal range of motion.  ?Skin: ?   General: Skin is warm.  ?   Capillary Refill: Capillary refill takes less than 2 seconds.  ?Neurological:  ?   General: No focal deficit present.  ?   Mental Status: She is alert and oriented to person, place, and time.  ?Psychiatric:     ?   Mood and Affect: Mood normal.  ? ? ?Labs and Imaging ?No results found for this or any previous visit (from the past 168 hour(s)). ?No results found. ? ?Assessment & Plan:  ?1. Vaginal odor ?More consistent with external labial odor from sweating. Will get vaginal swabs to rule out any infections just in case. ? ?2. Menopause/hot flashes ?Patient with hot flashes  and vaginal dryness since her 42s. Briefly tried Lexapro (5 mg for 2 weeks) after last visit but stopped due to concern for side effects. Did not find any relief however we discussed in detail that at low dose and initiation phase likely would not find relief yet. We discussed potential side effects in detail again and also that although Lexapro is also used as an antidepressant, the class of SSRIs are also used for symptom relief for menopause. Patient expressed understanding and upon further discussion and through shared decision making patient opts to try Lexapro and follow up in 4-6 weeks. We also discussed other options for symptom management including Gabapentin as well as HRT. At this time we discussed trying Lexapro first. We also discussed that given potential family hx of cancers as well as risks of HRT we would hold off on HRT until able to review genetic testing records as well as if other treatment methods are not successful. Patient expressed understanding. ? ?Also for vaginal dryness we discussed using lubricant for intercourse. Could also use daily (also option to use natural lubricant such as olive oil) for moisturizing effect if vaginal dryness bothersome to patient (not just with intercourse) ? ? ? ?Routine preventative health maintenance measures emphasized. ?Please refer to After Visit Summary for other counseling recommendations.  ? ?Return in about 1  month (around 03/20/2022) for F/up in 1 month for menopause. ? ? ?Total face-to-face time with patient: 30 minutes.  Over 50% of encounter was spent on counseling and coordination of care. ? ?Renard Matter, MD, MPH ?OB Fellow, Faculty Practice ?[facility] ? ?

## 2022-02-18 LAB — CERVICOVAGINAL ANCILLARY ONLY
Bacterial Vaginitis (gardnerella): NEGATIVE
Candida Glabrata: NEGATIVE
Candida Vaginitis: NEGATIVE
Chlamydia: NEGATIVE
Comment: NEGATIVE
Comment: NEGATIVE
Comment: NEGATIVE
Comment: NEGATIVE
Comment: NEGATIVE
Comment: NORMAL
Neisseria Gonorrhea: NEGATIVE
Trichomonas: NEGATIVE

## 2022-02-21 ENCOUNTER — Encounter: Payer: Self-pay | Admitting: Family Medicine

## 2022-02-23 ENCOUNTER — Encounter: Payer: Self-pay | Admitting: Family Medicine

## 2022-02-24 ENCOUNTER — Other Ambulatory Visit: Payer: Self-pay

## 2022-03-03 ENCOUNTER — Other Ambulatory Visit: Payer: Self-pay

## 2022-03-09 ENCOUNTER — Other Ambulatory Visit: Payer: Self-pay

## 2022-03-10 ENCOUNTER — Other Ambulatory Visit: Payer: Self-pay

## 2022-03-11 ENCOUNTER — Encounter: Payer: Self-pay | Admitting: Family Medicine

## 2022-03-14 ENCOUNTER — Other Ambulatory Visit: Payer: Self-pay

## 2022-03-14 ENCOUNTER — Other Ambulatory Visit (HOSPITAL_COMMUNITY): Payer: Self-pay

## 2022-03-14 ENCOUNTER — Other Ambulatory Visit: Payer: Self-pay | Admitting: Family Medicine

## 2022-03-14 MED ORDER — WEGOVY 0.25 MG/0.5ML ~~LOC~~ SOAJ
0.2500 mg | SUBCUTANEOUS | 3 refills | Status: DC
  Filled 2022-03-14: qty 2, 28d supply, fill #0
  Filled 2022-03-14: qty 2, fill #0

## 2022-03-17 ENCOUNTER — Other Ambulatory Visit: Payer: Self-pay

## 2022-03-23 ENCOUNTER — Ambulatory Visit: Payer: Self-pay | Admitting: Obstetrics & Gynecology

## 2022-04-11 ENCOUNTER — Other Ambulatory Visit: Payer: Self-pay

## 2022-04-13 ENCOUNTER — Other Ambulatory Visit: Payer: Self-pay

## 2022-04-25 ENCOUNTER — Encounter: Payer: Self-pay | Admitting: Family Medicine

## 2022-05-19 ENCOUNTER — Encounter: Payer: Self-pay | Admitting: Family Medicine

## 2022-05-20 ENCOUNTER — Other Ambulatory Visit: Payer: Self-pay

## 2022-05-26 ENCOUNTER — Other Ambulatory Visit: Payer: Self-pay

## 2022-05-26 ENCOUNTER — Encounter (HOSPITAL_BASED_OUTPATIENT_CLINIC_OR_DEPARTMENT_OTHER): Payer: Self-pay | Admitting: Family Medicine

## 2022-05-26 DIAGNOSIS — N3000 Acute cystitis without hematuria: Secondary | ICD-10-CM

## 2022-05-26 MED ORDER — CEPHALEXIN 500 MG PO CAPS
500.0000 mg | ORAL_CAPSULE | Freq: Two times a day (BID) | ORAL | 0 refills | Status: DC
  Filled 2022-05-26: qty 6, 3d supply, fill #0

## 2022-05-26 NOTE — Telephone Encounter (Signed)

## 2022-05-27 ENCOUNTER — Other Ambulatory Visit: Payer: Self-pay | Admitting: Family Medicine

## 2022-05-27 ENCOUNTER — Other Ambulatory Visit: Payer: Self-pay

## 2022-05-27 MED ORDER — LEVOCETIRIZINE DIHYDROCHLORIDE 5 MG PO TABS
5.0000 mg | ORAL_TABLET | Freq: Every evening | ORAL | 1 refills | Status: AC
  Filled 2022-05-27: qty 90, 90d supply, fill #0
  Filled 2023-01-24: qty 90, 90d supply, fill #1

## 2022-06-08 ENCOUNTER — Telehealth: Payer: Self-pay

## 2022-06-08 NOTE — Telephone Encounter (Signed)
Patient appearing on report for [employer] [facility] - Hypertension Control report due to last documented ambulatory blood pressure of 145/89 mmHg on 02/17/22. Next appointment with PCP is 06/13/22.   Outreached patient to discuss hypertension control and medication management.   Current antihypertensives: lisinopril-HCTZ 20-25 mg daily  Patient does not have an automated upper arm home BP machine.  Current blood pressure readings: not checking  Current meal patterns: eating more lately due to depressive episodes as patient reports she is an emotional eater. Previously on Trulicity (self DC'd due head fog/dizziness), unable to afford Ozempic ($1200/mo). Feels as though she needs an additional push to help her lose weight. Patient would benefit from enrolling in patient assistance program if available.    Current physical activity: limited due to fatigue and menopausal symptoms   Patient denies hypotensive signs and symptoms including dizziness, lightheadedness. But does have vertigo with occasional dizziness. Patient denies hypertensive symptoms including headache, chest pain, shortness of breath.  Patient denies side effects related to lisinopril-HCTZ   Assessment/Plan: - Currently controlled as only BP reading has been elevated  - Reviewed goal blood pressure <130/80 - Reviewed appropriate administration of medication regimen - Counseled on long term microvascular and macrovascular complications of uncontrolled hypertension - Reviewed appropriate home BP monitoring technique (avoid caffeine, smoking, and exercise for 30 minutes before checking, rest for at least 5 minutes before taking BP, sit with feet flat on the floor and back against a hard surface, uncross legs, and rest arm on flat surface) - Discussed dietary modifications, such as reduced salt intake, focus on whole grains, vegetables, lean proteins - Discussed goal of 150 minutes of moderate intensity physical activity  weekly  Joseph Art, Pharm.D. PGY-2 Ambulatory Care Pharmacy Resident 06/08/2022 11:12 AM

## 2022-06-13 ENCOUNTER — Encounter: Payer: Self-pay | Admitting: Family Medicine

## 2022-06-13 ENCOUNTER — Ambulatory Visit: Payer: Self-pay | Attending: Family Medicine | Admitting: Family Medicine

## 2022-06-13 ENCOUNTER — Other Ambulatory Visit: Payer: Self-pay

## 2022-06-13 VITALS — BP 143/85 | HR 71 | Temp 97.8°F | Ht 66.0 in | Wt 210.0 lb

## 2022-06-13 DIAGNOSIS — Z13228 Encounter for screening for other metabolic disorders: Secondary | ICD-10-CM

## 2022-06-13 DIAGNOSIS — R5383 Other fatigue: Secondary | ICD-10-CM

## 2022-06-13 DIAGNOSIS — E669 Obesity, unspecified: Secondary | ICD-10-CM

## 2022-06-13 DIAGNOSIS — I10 Essential (primary) hypertension: Secondary | ICD-10-CM

## 2022-06-13 DIAGNOSIS — H8113 Benign paroxysmal vertigo, bilateral: Secondary | ICD-10-CM

## 2022-06-13 DIAGNOSIS — Z780 Asymptomatic menopausal state: Secondary | ICD-10-CM

## 2022-06-13 MED ORDER — VALSARTAN-HYDROCHLOROTHIAZIDE 160-25 MG PO TABS
1.0000 | ORAL_TABLET | Freq: Every day | ORAL | 1 refills | Status: DC
  Filled 2022-06-13 – 2022-08-31 (×3): qty 90, 90d supply, fill #0
  Filled 2022-12-27: qty 90, 90d supply, fill #1

## 2022-06-13 NOTE — Progress Notes (Signed)
Subjective:  Patient ID: Katelyn Hardin, female    DOB: 03/17/1971  Age: 51 y.o. MRN: [id number]  CC: Hypertension   HPI Katelyn Hardin is a 51 y.o. year old female with a history of hypertension, vertigo, sciatica, hyperparathyroidism status post parathyroidectomy   Interval History: She has no energy and everything she does is forced, she is gaining weight, Complains of fatigue. 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 She had tried Trulicity with no much benefit and Wegovy was too expensive.  She has experienced some dizziness and does have Meclizine for her Vertigo but this is ineffective. There was also a question about if Trulicity was contributing to the dizziness given she does not have Diabetes. Her bones hurt, her sciatica flares up intermittently on both sides.  Seen by Gyn regarding menopausal symptoms and per notes due to FHx of cancer genetic test results HRT on hold and she was placed on Lexapro.  Doing well on her antihypertensive but her BP is slightly elevated.  Past Medical History:  Diagnosis Date   Anemia    Bacterial vaginosis    Complication of anesthesia    Slow to wake up   Family history of breast cancer    Fibroids    History of hiatal hernia    History of kidney stones    Hyperparathyroidism ([place])    Hypertension    IBS 03/10/2008   Qualifier: Diagnosis of  By: Hassell Done FNP, [place]     IBS (irritable bowel syndrome)    Menopausal hot flushes 07/07/2014    Past Surgical History:  Procedure Laterality Date   PARATHYROIDECTOMY N/A 05/09/2019   Procedure: PARATHYROIDECTOMY;  Surgeon: Armandina Gemma, MD;  Location: [facility];  Service: General;  Laterality: N/A;   TUBAL LIGATION     TUBAL LIGATION      Family History  Problem Relation Age of Onset   Cervical cancer Mother    Thyroid disease Sister    Breast cancer Sister 4   Cancer Maternal Grandfather        unknown   Breast cancer Other        dx in her  66s; MGM's sister   Breast cancer Other        56 of MGM sisters   Diabetes Neg Hx     Social History   Socioeconomic History   Marital status: Married    Spouse name: Not on file   Number of children: 2   Years of education: Not on file   Highest education level: Associate degree: occupational, Hotel manager, or vocational program  Occupational History   Not on file  Tobacco Use   Smoking status: Never   Smokeless tobacco: Never  Vaping Use   Vaping Use: Never used  Substance and Sexual Activity   Alcohol use: Yes    Alcohol/week: 6.0 standard drinks of alcohol    Types: 2 Glasses of wine, 4 Standard drinks or equivalent per week    Comment: 2 glasses of wine daily   Drug use: No   Sexual activity: Yes    Birth control/protection: Post-menopausal  Other Topics Concern   Not on file  Social History Narrative   Not on file   Social Determinants of Health   Financial Resource Strain: Not on file  Food Insecurity: No Food Insecurity (02/17/2022)   Hunger Vital Sign    Worried About Running Out of Food in the Last Year: Never true    Ran Out  of Food in the Last Year: Never true  Transportation Needs: No Transportation Needs (02/17/2022)   PRAPARE - Hydrologist (Medical): No    Lack of Transportation (Non-Medical): No  Physical Activity: Not on file  Stress: Not on file  Social Connections: Not on file    Allergies  Allergen Reactions   Acetaminophen Other (See Comments)    Jittery and upset stomach   Aspirin Nausea And Vomiting   Codeine     Jittery and upset stomach   Oxycodone-Acetaminophen     Jittery and upset stomach   Vicodin [Hydrocodone-Acetaminophen]     Jittery and upset stomach    Outpatient Medications Prior to Visit  Medication Sig Dispense Refill   cyclobenzaprine (FLEXERIL) 10 MG tablet Take 1 tablet (10 mg total) by mouth 3 (three) times daily as needed for muscle spasms. 30 tablet 1   Fluocinolone Acetonide Body 0.01  % OIL      fluticasone (FLONASE) 50 MCG/ACT nasal spray Place 1 spray into both nostrils daily. 16 g 2   hydrocortisone 2.5 % ointment      levocetirizine (XYZAL ALLERGY 24HR) 5 MG tablet Take 1 tablet (5 mg total) by mouth every evening. 90 tablet 1   meclizine (ANTIVERT) 25 MG tablet Take 1 tablet (25 mg total) by mouth 3 (three) times daily as needed for dizziness. 60 tablet 3   Multiple Vitamin (MULTIVITAMIN WITH MINERALS) TABS tablet Take 1 tablet by mouth daily.     polyethylene glycol powder (GLYCOLAX/MIRALAX) 17 GM/SCOOP powder Take 17 g by mouth daily. 3350 g 1   Probiotic Product (PROBIOTIC PO) Take 1 capsule by mouth daily.     lisinopril-hydrochlorothiazide (ZESTORETIC) 20-25 MG tablet TAKE 1 TABLET BY MOUTH DAILY. 30 tablet 6   carbamide peroxide (DEBROX) 6.5 % OTIC solution Place 5 drops into both ears 2 (two) times daily. (Patient not taking: Reported on 02/17/2022) 15 mL 0   cephALEXin (KEFLEX) 500 MG capsule Take 1 capsule (500 mg total) by mouth 2 (two) times daily. (Patient not taking: Reported on 06/13/2022) 6 capsule 0   erythromycin ophthalmic ointment Place 1 application into both eyes at bedtime. (Patient not taking: Reported on 12/14/2021) 3.5 g 0   escitalopram (LEXAPRO) 10 MG tablet Take 1 tablet (10 mg total) by mouth daily. Start with half a tablet once a day and if tolerating without side effect go to full tablet. 60 tablet 0   Semaglutide-Weight Management (WEGOVY) 0.25 MG/0.5ML SOAJ Inject 0.25 mg into the skin once a week. (Patient not taking: Reported on 06/13/2022) 2 mL 3   No facility-administered medications prior to visit.     ROS Review of Systems  Constitutional:  Positive for fatigue. Negative for activity change and appetite change.  HENT:  Negative for congestion, sinus pressure and sore throat.   Eyes:  Negative for visual disturbance.  Respiratory:  Negative for cough, chest tightness, shortness of breath and wheezing.   Cardiovascular:  Negative for  chest pain and palpitations.  Gastrointestinal:  Negative for abdominal distention, abdominal pain and constipation.  Endocrine: Negative for polydipsia.  Genitourinary:  Negative for dysuria and frequency.  Musculoskeletal:  Positive for arthralgias. Negative for back pain.  Skin:  Negative for rash.  Neurological:  Negative for tremors, light-headedness and numbness.  Hematological:  Does not bruise/bleed easily.  Psychiatric/Behavioral:  Negative for agitation and behavioral problems.     Objective:  BP (!) 143/85   Pulse 71   Temp 97.8  F (36.6 C) (Oral)   Ht '5\' 6"'$  (1.676 m)   Wt 210 lb (95.3 kg)   LMP 11/19/2016   SpO2 100%   BMI 33.89 kg/m      06/13/2022    3:00 PM 02/17/2022   10:03 AM 02/17/2022    9:41 AM  BP/Weight  Systolic BP 035 009 381  Diastolic BP 85 89 96  Wt. (Lbs) 210  203.2  BMI 33.89 kg/m2  32.8 kg/m2      Physical Exam Constitutional:      Appearance: She is well-developed.  Cardiovascular:     Rate and Rhythm: Normal rate.     Heart sounds: Normal heart sounds. No murmur heard. Pulmonary:     Effort: Pulmonary effort is normal.     Breath sounds: Normal breath sounds. No wheezing or rales.  Chest:     Chest wall: No tenderness.  Abdominal:     General: Bowel sounds are normal. There is no distension.     Palpations: Abdomen is soft. There is no mass.     Tenderness: There is no abdominal tenderness.  Musculoskeletal:        General: Normal range of motion.     Right lower leg: No edema.     Left lower leg: No edema.  Neurological:     Mental Status: She is alert and oriented to person, place, and time.  Psychiatric:        Mood and Affect: Mood normal.        Latest Ref Rng & Units 01/17/2022   12:21 PM 07/21/2021   11:40 AM 04/09/2020   10:05 AM  CMP  Glucose 70 - 99 mg/dL 88  88  83   BUN 6 - 24 mg/dL '11  13  17   '$ Creatinine 0.57 - 1.00 mg/dL 0.93  1.13  0.94   Sodium 134 - 144 mmol/L 143  141  140   Potassium 3.5 - 5.2  mmol/L 4.3  4.1  3.9   Chloride 96 - 106 mmol/L 103  101  100   CO2 20 - 29 mmol/L '28  27  25   '$ Calcium 8.7 - 10.2 mg/dL 9.5  9.8  9.3   Total Protein 6.0 - 8.5 g/dL 7.7  7.8  7.5   Total Bilirubin 0.0 - 1.2 mg/dL <0.2  0.2  0.3   Alkaline Phos 44 - 121 IU/L 105  98  85   AST 0 - 40 IU/L '31  23  19   '$ ALT 0 - 32 IU/L 41  26  17     Lipid Panel     Component Value Date/Time   CHOL 213 (H) 01/17/2022 1221   TRIG 136 01/17/2022 1221   HDL 38 (L) 01/17/2022 1221   CHOLHDL 4.4 10/23/2019 1110   CHOLHDL 3.6 04/04/2013 1022   VLDL 20 04/04/2013 1022   LDLCALC 150 (H) 01/17/2022 1221    CBC    Component Value Date/Time   WBC 5.7 04/09/2020 1005   WBC 8.2 05/12/2019 2110   RBC 4.14 04/09/2020 1005   RBC 3.44 (L) 05/12/2019 2110   HGB 13.0 04/09/2020 1005   HCT 38.5 04/09/2020 1005   PLT 345 04/09/2020 1005   MCV 93 04/09/2020 1005   MCH 31.4 04/09/2020 1005   MCH 31.7 05/12/2019 2110   MCHC 33.8 04/09/2020 1005   MCHC 32.2 05/12/2019 2110   RDW 13.3 04/09/2020 1005   LYMPHSABS 2.3 04/09/2020 1005   MONOABS 0.5 02/07/2014  1711   EOSABS 0.1 04/09/2020 1005   BASOSABS 0.0 04/09/2020 1005    Lab Results  Component Value Date   HGBA1C 5.4 01/17/2022    Lab Results  Component Value Date   TSH 0.958 04/09/2020    Assessment & Plan:  1. Essential hypertension, benign Slightly above goal Switched from [place] to [place] on blood pressure goal of less than 130/80, low-sodium, DASH diet, medication compliance, 150 minutes of moderate intensity exercise per week. Discussed medication compliance, adverse effects. - valsartan-hydrochlorothiazide (DIOVAN-HCT) 160-25 MG tablet; Take 1 tablet by mouth daily.  Dispense: 90 tablet; Refill: 1  2. Mild obesity She has gained 10 lbs since her last visit She has not been exercising Reduce caloric intake - emotional eating contributing Previously referred to medical weight management but she was unable to  afford this. Unable to afford GLP1RA Provide info for Qysmia and she will review this and decide if she would like to try it  3. Fatigue, unspecified type Menopausal symptoms contributing Counseled on commencing a regular exercise regimen - CBC with Differential/Platelet - VITAMIN D 25 Hydroxy (Vit-D Deficiency, Fractures)  4. Benign paroxysmal positional vertigo, bilateral Uncontrolled on Meclizine - Ambulatory referral to Physical Therapy  5. Menopause Vasomotor and genitourinary symptoms present Currently followed by GYN  6. Screening for metabolic disorder - Hemoglobin A1c    Meds ordered this encounter  Medications   valsartan-hydrochlorothiazide (DIOVAN-HCT) 160-25 MG tablet    Sig: Take 1 tablet by mouth daily.    Dispense:  90 tablet    Refill:  1    Discontinue lisinopril/HCTZ    Follow-up: Return in about 6 months (around 12/14/2022).       Charlott Rakes, MD, FAAFP. [facility] and [facility], [place]   06/13/2022, 5:38 PM

## 2022-06-13 NOTE — Patient Instructions (Signed)
Phentermine; Topiramate Extended-Release Capsules What is this medication? Phentermine; Topiramate (FEN ter meen; Toe PYRE a mate) promotes weight loss. It may also be used to maintain weight loss. It works by decreasing appetite. Changes to diet and exercise are often combined with this medication. This medicine may be used for other purposes; ask your health care provider or pharmacist if you have questions. COMMON BRAND NAME(S): Qsymia What should I tell my care team before I take this medication? They need to know if you have any of these conditions: Bone problems Depression or other mental health condition Diabetes Diarrhea Glaucoma Having surgery Heart disease High blood pressure History of heart attack or stroke History of irregular heartbeat History of substance use disorder or alcohol use disorder Kidney disease or stones Liver disease Low levels of potassium in the blood Lung or breathing disease, like asthma Metabolic acidosis On a ketogenic diet Seizures Suicidal thoughts, plans, or attempt by you or a family member Taken an MAOI like Carbex, Eldepryl, Marplan, Nardil, or Parnate in last 14 days Thyroid disease An unusual or allergic reaction to phentermine, topiramate, other medications, foods, dyes, or preservatives Pregnant or trying to get pregnant Breast-feeding How should I use this medication? Take this medication by mouth with a glass of water. Follow the directions on the prescription label. Do not crush or chew. This medication is usually taken with or without food once per day in the morning. Avoid taking this medication in the evening. It may interfere with sleep. Take your doses at regular intervals. Do not take your medication more often than directed. A special MedGuide will be given to you by the pharmacist with each prescription and refill. Be sure to read this information carefully each time. Talk to your care team about the use of this medication in  children. While it may be prescribed for children as young as 12 years for selected conditions, precautions do apply. Overdosage: If you think you have taken too much of this medicine contact a poison control center or emergency room at once. NOTE: This medicine is only for you. Do not share this medicine with others. What if I miss a dose? If you miss a dose, skip it. Take your next dose at the normal time. Do not take extra or 2 doses at the same time to make up for the missed dose. What may interact with this medication? Do not take this medication with any of the following: MAOIs like Carbex, Eldepryl, Marplan, Nardil, and Parnate This medication may also interact with the following: Acetazolamide Alcohol Antihistamines for allergy, cough and cold Atropine Birth control pills Carbamazepine Certain medications for bladder problems like oxybutynin, tolterodine Certain medications for depression, anxiety, or psychotic disturbances Certain medications for high blood pressure Certain medications for Parkinson disease like benztropine, trihexyphenidyl Certain medications for sleep Certain medications for stomach problems like dicyclomine, hyoscyamine Certain medications for travel sickness like scopolamine Dichlorphenamide Digoxin Diuretics Linezolid Medications for colds or breathing difficulties like pseudoephedrine or phenylephrine Medications for diabetes Methazolamide Narcotic medications for pain Phenytoin Sibutramine Stimulant medications for attention disorders, weight loss, or to stay awake Valproic acid Zonisamide This list may not describe all possible interactions. Give your health care provider a list of all the medicines, herbs, non-prescription drugs, or dietary supplements you use. Also tell them if you smoke, drink alcohol, or use illegal drugs. Some items may interact with your medicine. What should I watch for while using this medication? Visit your care team for  regular checks on  your progress. Do not stop taking this medication except on your care team's advice. You may develop a severe reaction. Your care team will tell you how much medication to take. Do not take this medication close to bedtime. It may prevent you from sleeping. Avoid extreme heat. This medication can cause you to sweat less than normal. Your body temperature could increase to dangerous levels, which may lead to heat stroke. You should drink plenty of fluids while taking this medication. If you have had kidney stones in the past, this will help to reduce your chances of forming kidney stones. You may get drowsy or dizzy. Do not drive, use machinery, or do anything that needs mental alertness until you know how this medication affects you. Do not stand or sit up quickly, especially if you are an older patient. This reduces the risk of dizzy or fainting spells. Alcohol may increase dizziness and drowsiness. Avoid alcoholic drinks. This medication may affect blood sugar levels. Ask your care team if changes in diet or medications are needed if you have diabetes. Check with your care team if you have severe diarrhea, nausea, and vomiting, or if you sweat a lot. The loss of too much body fluid may make it dangerous for you to take this medication. Tell your care team right away if you have any change in your eyesight. Watch for new or worsening thoughts of suicide or depression. This includes sudden changes in mood, behaviors, or thoughts. These changes can happen at any time but are more common in the beginning of treatment or after a change in dose. Call your care team right away if you experience these thoughts or worsening depression. This medication may cause serious skin reactions. They can happen weeks to months after starting the medication. Contact your care team right away if you notice fevers or flu-like symptoms with a rash. The rash may be red or purple and then turn into blisters or  peeling of the skin. Or, you might notice a red rash with swelling of the face, lips or lymph nodes in your neck or under your arms. Birth control may not work properly while you are taking this medication. Talk to your care team about using an extra method of birth control. Tell your care team if you wish to become pregnant or think you might be pregnant. There is a potential for serious side effects and harm to an unborn child. Losing weight while pregnant is not advised and may cause harm to the unborn child. Talk to your care team for more information. What side effects may I notice from receiving this medication? Side effects that you should report to your care team as soon as possible: Allergic reactions--skin rash, itching, hives, swelling of the face, lips, tongue, or throat Difficulty with paying attention, memory, or speech Fast or irregular heartbeat Fever that does not go away, decreased sweating High acid level--trouble breathing, unusual weakness or fatigue, confusion, headache, fast or irregular heartbeat, nausea, vomiting High ammonia level--unusual weakness or fatigue, confusion, loss of appetite, nausea, vomiting, seizures Kidney injury--decrease in the amount of urine, swelling of the ankles, hands, or feet Kidney stones--blood in the urine, pain or trouble passing urine, pain in the lower back or sides Low potassium level--muscle pain or cramps, unusual weakness or fatigue, fast or irregular heartbeat, constipation Mood and behavior changes--anxiety, nervousness, confusion, hallucinations, irritability, hostility, thoughts of suicide or self-harm, worsening mood, feelings of depression Redness, blistering, peeling, or loosening of the skin, including inside  the mouth Sudden eye pain or change in vision such as blurry vision, seeing halos around lights, vision loss Side effects that usually do not require medical attention (report to your care team if they continue or are  bothersome): Burning or tingling sensation in hands or feet Change in taste Constipation Dizziness Dry mouth Trouble sleeping This list may not describe all possible side effects. Call your doctor for medical advice about side effects. You may report side effects to FDA at 1-800-FDA-1088. Where should I keep my medication? Keep out of the reach of children and pets. This medication can be abused. Keep it in a safe place to protect it from theft. Do not share it with anyone. It is only for you. Selling or giving away this medication is dangerous and against the law. Store at room temperature between 15 and 25 degrees C (59 and 77 degrees F). This medication may cause harm and death if it is taken by other adults, children, or pets. It is important to get rid of the medication as soon as you no longer need it, or if it is expired. You can do this in two ways: Take the medication to a medication take-back program. Check with your pharmacy or law enforcement to find a location. If you cannot return the medication, check the label or package insert to see if the medication should be thrown out in the garbage or flushed down the toilet. If you are not sure, ask your care team. If it is safe to put in the trash, take the medication out of the container. Mix the medication with cat litter, dirt, coffee grounds, or other unwanted substance. Seal the mixture in a bag or container. Put it in the trash. NOTE: This sheet is a summary. It may not cover all possible information. If you have questions about this medicine, talk to your doctor, pharmacist, or health care provider.  2023 Elsevier/Gold Standard (2021-12-09 00:00:00)

## 2022-06-14 LAB — HEMOGLOBIN A1C
Est. average glucose Bld gHb Est-mCnc: 111 mg/dL
Hgb A1c MFr Bld: 5.5 % (ref 4.8–5.6)

## 2022-06-14 LAB — CBC WITH DIFFERENTIAL/PLATELET
Basophils Absolute: 0 10*3/uL (ref 0.0–0.2)
Basos: 0 %
EOS (ABSOLUTE): 0.1 10*3/uL (ref 0.0–0.4)
Eos: 1 %
Hematocrit: 37.6 % (ref 34.0–46.6)
Hemoglobin: 12.2 g/dL (ref 11.1–15.9)
Immature Grans (Abs): 0 10*3/uL (ref 0.0–0.1)
Immature Granulocytes: 0 %
Lymphocytes Absolute: 3 10*3/uL (ref 0.7–3.1)
Lymphs: 52 %
MCH: 30.2 pg (ref 26.6–33.0)
MCHC: 32.4 g/dL (ref 31.5–35.7)
MCV: 93 fL (ref 79–97)
Monocytes Absolute: 0.4 10*3/uL (ref 0.1–0.9)
Monocytes: 6 %
Neutrophils Absolute: 2.4 10*3/uL (ref 1.4–7.0)
Neutrophils: 41 %
Platelets: 352 10*3/uL (ref 150–450)
RBC: 4.04 x10E6/uL (ref 3.77–5.28)
RDW: 13.4 % (ref 11.7–15.4)
WBC: 5.8 10*3/uL (ref 3.4–10.8)

## 2022-06-14 LAB — VITAMIN D 25 HYDROXY (VIT D DEFICIENCY, FRACTURES): Vit D, 25-Hydroxy: 46.8 ng/mL (ref 30.0–100.0)

## 2022-06-20 ENCOUNTER — Other Ambulatory Visit: Payer: Self-pay

## 2022-06-20 ENCOUNTER — Ambulatory Visit: Payer: Self-pay

## 2022-06-30 ENCOUNTER — Encounter: Payer: Self-pay | Admitting: Family Medicine

## 2022-07-01 ENCOUNTER — Other Ambulatory Visit: Payer: Self-pay | Admitting: Family Medicine

## 2022-07-01 ENCOUNTER — Other Ambulatory Visit: Payer: Self-pay

## 2022-07-01 MED ORDER — QSYMIA 7.5-46 MG PO CP24
1.0000 | ORAL_CAPSULE | Freq: Every day | ORAL | 2 refills | Status: DC
  Filled 2022-07-01: qty 30, 30d supply, fill #0

## 2022-07-01 MED ORDER — QSYMIA 3.75-23 MG PO CP24
1.0000 | ORAL_CAPSULE | Freq: Every day | ORAL | 0 refills | Status: DC
  Filled 2022-07-01: qty 14, 14d supply, fill #0

## 2022-07-04 ENCOUNTER — Other Ambulatory Visit: Payer: Self-pay

## 2022-08-17 NOTE — Telephone Encounter (Signed)
Patient appearing on report for [facility] - Hypertension Control report due to last documented ambulatory blood pressure of 143/85 mmHg on 06/13/22. Next appointment with PCP is not yet scheduled.    Outreached patient to discuss hypertension control and medication management.   Current antihypertensives: lisinopril-HCTZ 20-25 mg daily  Patient has an automated upper arm home BP machine. However, has not been checking her BP recently.   Patient denies side effects related to BP regimen.    Assessment/Plan: - Currently uncontrolled based on last OV BP - Reviewed goal blood pressure <130/80 - Requested patient check BP and keep a log to bring to follow up visits.   Joseph Art, Pharm.D. PGY-2 Ambulatory Care Pharmacy Resident 08/17/2022 3:40 PM

## 2022-08-24 ENCOUNTER — Other Ambulatory Visit: Payer: Self-pay

## 2022-08-25 ENCOUNTER — Encounter: Payer: Self-pay | Admitting: Family Medicine

## 2022-08-31 ENCOUNTER — Encounter: Payer: Self-pay | Admitting: Family Medicine

## 2022-08-31 ENCOUNTER — Other Ambulatory Visit: Payer: Self-pay

## 2022-09-02 ENCOUNTER — Other Ambulatory Visit: Payer: Self-pay

## 2022-10-18 ENCOUNTER — Other Ambulatory Visit: Payer: Self-pay | Admitting: Family Medicine

## 2022-10-18 DIAGNOSIS — Z1231 Encounter for screening mammogram for malignant neoplasm of breast: Secondary | ICD-10-CM

## 2022-11-01 ENCOUNTER — Encounter: Payer: Self-pay | Admitting: Critical Care Medicine

## 2022-11-03 ENCOUNTER — Ambulatory Visit: Payer: Self-pay | Admitting: Family Medicine

## 2022-11-03 ENCOUNTER — Other Ambulatory Visit: Payer: Self-pay

## 2022-11-03 ENCOUNTER — Encounter: Payer: Self-pay | Admitting: Critical Care Medicine

## 2022-11-03 ENCOUNTER — Other Ambulatory Visit (HOSPITAL_COMMUNITY)
Admission: RE | Admit: 2022-11-03 | Discharge: 2022-11-03 | Disposition: A | Discharge status: Home / Self Care | Payer: Self-pay | Source: Ambulatory Visit | Attending: Family Medicine | Admitting: Family Medicine

## 2022-11-03 ENCOUNTER — Ambulatory Visit: Payer: Self-pay | Attending: Family Medicine | Admitting: Critical Care Medicine

## 2022-11-03 VITALS — BP 149/93 | HR 65 | Ht 66.0 in | Wt 207.8 lb

## 2022-11-03 DIAGNOSIS — N898 Other specified noninflammatory disorders of vagina: Secondary | ICD-10-CM | POA: Insufficient documentation

## 2022-11-03 DIAGNOSIS — H65193 Other acute nonsuppurative otitis media, bilateral: Secondary | ICD-10-CM

## 2022-11-03 DIAGNOSIS — J0191 Acute recurrent sinusitis, unspecified: Secondary | ICD-10-CM | POA: Insufficient documentation

## 2022-11-03 MED ORDER — FLUTICASONE PROPIONATE 50 MCG/ACT NA SUSP
1.0000 | Freq: Every day | NASAL | 2 refills | Status: DC
  Filled 2022-11-03: qty 16, 25d supply, fill #0

## 2022-11-03 MED ORDER — CEFDINIR 300 MG PO CAPS
300.0000 mg | ORAL_CAPSULE | Freq: Two times a day (BID) | ORAL | 0 refills | Status: AC
  Filled 2022-11-03: qty 10, 5d supply, fill #0

## 2022-11-03 MED ORDER — AZELASTINE HCL 0.1 % NA SOLN
2.0000 | Freq: Two times a day (BID) | NASAL | 12 refills | Status: AC
  Filled 2022-11-03: qty 30, 25d supply, fill #0

## 2022-11-03 NOTE — Assessment & Plan Note (Signed)
Acute sinusitis will administer Flonase Astelin cefdinir and saline rinse

## 2022-11-03 NOTE — Patient Instructions (Signed)
Vaginal swab will be obtained we will give you results Take cefdinir twice daily for sinus infection for 5 days  Obtain saline rinse to 3 sprays each nostril twice daily to rinse out your sinuses  Start Astelin 2 sprays each nostril daily and Flonase 2 sprays each nostril daily for sinus congestion  Return to see Dr. Margarita Rana end of January

## 2022-11-03 NOTE — Progress Notes (Signed)
Ears feel clogged and feels that there is fluid in both ears. Ears are also itching. This issue is reoccurring.  Pt also states that she has a fishy vaginal odor.

## 2022-11-03 NOTE — Assessment & Plan Note (Signed)
Will obtain cervical vaginal swab

## 2022-11-03 NOTE — Progress Notes (Signed)
Acute Office Visit  Subjective:     Patient ID: Katelyn Hardin, female    DOB: [birth year], 51 y.o.   MRN: [id number]  Chief Complaint  Patient presents with   Ear Fullness    HPI Patient is in today for ear fullness acute work in 51 y.o.F as seen previously for cerumen impaction comes in today with 2-week history of decreased hearing itching in the ears tunnel sort of sound in the ears some nasal congestion no cough.  She also has a fishy odor in her vagina where he would like this test.  Review of Systems  Constitutional:  Negative for chills, diaphoresis, fever, malaise/fatigue and weight loss.  HENT:  Positive for congestion and hearing loss. Negative for ear discharge, ear pain, nosebleeds, sore throat and tinnitus.   Eyes:  Negative for blurred vision, photophobia and redness.  Respiratory:  Negative for cough, hemoptysis, sputum production, shortness of breath, wheezing and stridor.   Cardiovascular:  Negative for chest pain, palpitations, orthopnea, claudication, leg swelling and PND.  Gastrointestinal:  Negative for abdominal pain, blood in stool, constipation, diarrhea, heartburn, nausea and vomiting.  Genitourinary:  Negative for dysuria, flank pain, frequency, hematuria and urgency.  Musculoskeletal:  Negative for back pain, falls, joint pain, myalgias and neck pain.  Skin:  Negative for itching and rash.  Neurological:  Negative for dizziness, tingling, tremors, sensory change, speech change, focal weakness, seizures, loss of consciousness, weakness and headaches.  Endo/Heme/Allergies:  Negative for environmental allergies and polydipsia. Does not bruise/bleed easily.  Psychiatric/Behavioral:  Negative for depression, memory loss, substance abuse and suicidal ideas. The patient is not nervous/anxious and does not have insomnia.         Objective:    BP (!) 149/93   Pulse 65   Ht '5\' 6"'$  (1.676 m)   Wt 207 lb 12.8 oz (94.3 kg)   LMP 11/19/2016   SpO2  99%   BMI 33.54 kg/m    Physical Exam Vitals reviewed.  Constitutional:      Appearance: Normal appearance. She is well-developed. She is not diaphoretic.  HENT:     Head: Normocephalic and atraumatic.     Right Ear: Ear canal and external ear normal. There is no impacted cerumen.     Left Ear: Ear canal and external ear normal. There is no impacted cerumen.     Ears:     Comments: Minimal air-fluid levels in the tympanic membranes no evidence of otitis media    Nose: Congestion and rhinorrhea present. No nasal deformity, septal deviation or mucosal edema.     Right Sinus: No maxillary sinus tenderness or frontal sinus tenderness.     Left Sinus: No maxillary sinus tenderness or frontal sinus tenderness.     Comments: Purulence seen in the naris    Mouth/Throat:     Pharynx: No oropharyngeal exudate.  Eyes:     General: No scleral icterus.    Conjunctiva/sclera: Conjunctivae normal.     Pupils: Pupils are equal, round, and reactive to light.  Neck:     Thyroid: No thyromegaly.     Vascular: No carotid bruit or JVD.     Trachea: Trachea normal. No tracheal tenderness or tracheal deviation.  Cardiovascular:     Rate and Rhythm: Normal rate and regular rhythm.     Chest Wall: PMI is not displaced.     Pulses: Normal pulses. No decreased pulses.     Heart sounds: Normal heart sounds, S1 normal and S2 normal. Heart  sounds not distant. No murmur heard.    No systolic murmur is present.     No diastolic murmur is present.     No friction rub. No gallop. No S3 or S4 sounds.  Pulmonary:     Effort: No tachypnea, accessory muscle usage or respiratory distress.     Breath sounds: No stridor. No decreased breath sounds, wheezing, rhonchi or rales.  Chest:     Chest wall: No tenderness.  Abdominal:     General: Bowel sounds are normal. There is no distension.     Palpations: Abdomen is soft. Abdomen is not rigid.     Tenderness: There is no abdominal tenderness. There is no guarding  or rebound.  Musculoskeletal:        General: Normal range of motion.     Cervical back: Normal range of motion and neck supple. No edema, erythema or rigidity. No muscular tenderness. Normal range of motion.  Lymphadenopathy:     Head:     Right side of head: No submental or submandibular adenopathy.     Left side of head: No submental or submandibular adenopathy.     Cervical: No cervical adenopathy.  Skin:    General: Skin is warm and dry.     Coloration: Skin is not pale.     Findings: No rash.     Nails: There is no clubbing.  Neurological:     Mental Status: She is alert and oriented to person, place, and time.     Sensory: No sensory deficit.  Psychiatric:        Speech: Speech normal.        Behavior: Behavior normal.     No results found for any visits on 11/03/22.      Assessment & Plan:   Problem List Items Addressed This Visit       Respiratory   Acute recurrent sinusitis - Primary    Acute sinusitis will administer Flonase Astelin cefdinir and saline rinse      Relevant Medications   fluticasone (FLONASE) 50 MCG/ACT nasal spray   cefdinir (OMNICEF) 300 MG capsule   azelastine (ASTELIN) 0.1 % nasal spray     Nervous and Auditory   Acute MEE (middle ear effusion), bilateral   Relevant Medications   fluticasone (FLONASE) 50 MCG/ACT nasal spray   cefdinir (OMNICEF) 300 MG capsule     Other   Vaginal odor    Will obtain cervical vaginal swab      Relevant Orders   Cervicovaginal ancillary only    Meds ordered this encounter  Medications   fluticasone (FLONASE) 50 MCG/ACT nasal spray    Sig: Place 1 spray into both nostrils daily.    Dispense:  16 g    Refill:  2   cefdinir (OMNICEF) 300 MG capsule    Sig: Take 1 capsule (300 mg total) by mouth 2 (two) times daily for 5 days.    Dispense:  10 capsule    Refill:  0   azelastine (ASTELIN) 0.1 % nasal spray    Sig: Insert 2 sprays into both nostrils 2 (two) times daily    Dispense:  30 mL     Refill:  12    Return in about 7 weeks (around 12/20/2022) for htn.  Asencion Noble, MD

## 2022-11-04 LAB — CERVICOVAGINAL ANCILLARY ONLY
Bacterial Vaginitis (gardnerella): NEGATIVE
Candida Glabrata: NEGATIVE
Candida Vaginitis: NEGATIVE
Chlamydia: NEGATIVE
Comment: NEGATIVE
Comment: NEGATIVE
Comment: NEGATIVE
Comment: NEGATIVE
Comment: NEGATIVE
Comment: NORMAL
Neisseria Gonorrhea: NEGATIVE
Trichomonas: NEGATIVE

## 2022-11-04 NOTE — Progress Notes (Signed)
Let pt know no organisms in vagina neg swab

## 2022-11-07 ENCOUNTER — Telehealth: Payer: Self-pay

## 2022-11-07 NOTE — Telephone Encounter (Signed)
-----   Message from Elsie Stain, MD sent at 11/04/2022  1:01 PM EST ----- Let pt know no organisms in vagina neg swab

## 2022-11-07 NOTE — Telephone Encounter (Signed)
Pt was called and is aware of results, DOB was confirmed.  ?

## 2022-11-10 ENCOUNTER — Encounter: Payer: Self-pay | Admitting: Genetic Counselor

## 2022-11-10 NOTE — Progress Notes (Signed)
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

## 2022-12-15 ENCOUNTER — Ambulatory Visit
Admission: RE | Admit: 2022-12-15 | Discharge: 2022-12-15 | Disposition: A | Discharge status: Home / Self Care | Payer: No Typology Code available for payment source | Source: Ambulatory Visit | Attending: Family Medicine | Admitting: Family Medicine

## 2022-12-15 DIAGNOSIS — Z1231 Encounter for screening mammogram for malignant neoplasm of breast: Secondary | ICD-10-CM

## 2022-12-20 ENCOUNTER — Ambulatory Visit: Payer: Self-pay | Admitting: Family Medicine

## 2022-12-27 ENCOUNTER — Ambulatory Visit: Payer: Self-pay | Attending: Family Medicine

## 2022-12-27 ENCOUNTER — Other Ambulatory Visit: Payer: Self-pay | Admitting: Family Medicine

## 2022-12-27 ENCOUNTER — Other Ambulatory Visit: Payer: Self-pay

## 2022-12-27 DIAGNOSIS — Z23 Encounter for immunization: Secondary | ICD-10-CM

## 2022-12-27 NOTE — Progress Notes (Signed)
Pneumonia vaccine in left deltoid and Flu vaccine administered in right deltoid per protocols.  Information sheet given. Patient denies and pain or discomfort at injection site. Tolerated injection well no reaction.

## 2022-12-28 ENCOUNTER — Other Ambulatory Visit: Payer: Self-pay

## 2022-12-28 MED ORDER — CYCLOBENZAPRINE HCL 10 MG PO TABS
10.0000 mg | ORAL_TABLET | Freq: Three times a day (TID) | ORAL | 1 refills | Status: DC | PRN
  Filled 2022-12-28 – 2023-06-27 (×2): qty 30, 10d supply, fill #0

## 2022-12-28 NOTE — Telephone Encounter (Signed)
Requested medication (s) are due for refill today: yes   Requested medication (s) are on the active medication list: yes   Last refill:  12/07/21 #30 1 refills   Future visit scheduled: no   Notes to clinic:  not delegated per protocol . Do you want to refill Rx?     Requested Prescriptions  Pending Prescriptions Disp Refills   cyclobenzaprine (FLEXERIL) 10 MG tablet 30 tablet 1    Sig: Take 1 tablet (10 mg total) by mouth 3 (three) times daily as needed for muscle spasms.     Not Delegated - Analgesics:  Muscle Relaxants Failed - 12/27/2022 11:38 AM      Failed - This refill cannot be delegated      Passed - Valid encounter within last 6 months    Recent Outpatient Visits           1 month ago Acute recurrent sinusitis, unspecified location   [place], [place]   6 months ago Essential hypertension, benign   [place] Charlott Rakes, MD   11 months ago Bilateral impacted cerumen   [place], [place], MD   1 year ago Acute bilateral low back pain with sciatica, sciatica laterality unspecified   [place] Elsie Stain, MD   1 year ago Acute conjunctivitis of both eyes, unspecified acute conjunctivitis type   [place], [place], [place]

## 2023-01-04 ENCOUNTER — Other Ambulatory Visit: Payer: Self-pay

## 2023-01-04 NOTE — Progress Notes (Signed)
Patient outreached by Angus Seller, PharmD Candidate on 01/04/2023 to discuss hypertension.   Patient has an automated home blood pressure machine, but does not use frequently at home. She was not sure what her readings were at home and noted it had been a while since she last checked.   Medication review was performed. They are taking medications as prescribed. She was taking lisinopril/HCTZ before and was switched to valsartan/HCTZ in July 2023. She has not noted any adverse effects and stated she was adherent to the medication. No health concerns otherwise at this time except for menopausal symptoms.   The following barriers to adherence were noted:  - They do not have cost concerns.  - They do not have transportation concerns.  - They do not need assistance obtaining refills.  - They do not occasionally forget to take some of their prescribed medications.  - They do not feel like one/some of their medications make them feel poorly.  - They do not have questions or concerns about their medications.  - They do not have follow up scheduled with their primary care provider/cardiologist.   The following interventions were completed:  - Medications were reviewed  - Patient was educated on goal blood pressures and long term health implications of elevated blood pressure  - Patient was educated on medications, including indication and administration   The patient does not have follow up scheduled PCP: Dr. Marcell Anger, PharmD Candidate  [facility], Class of 2024   Joseph Art, [place].D. PGY-2 Ambulatory Care Pharmacy Resident 01/04/2023 2:06 PM

## 2023-01-24 ENCOUNTER — Other Ambulatory Visit: Payer: Self-pay

## 2023-01-26 ENCOUNTER — Other Ambulatory Visit: Payer: Self-pay

## 2023-02-02 ENCOUNTER — Ambulatory Visit: Payer: Self-pay | Admitting: Physician Assistant

## 2023-02-21 ENCOUNTER — Encounter: Payer: Self-pay | Admitting: Family Medicine

## 2023-02-22 ENCOUNTER — Ambulatory Visit: Payer: Self-pay | Attending: Physician Assistant | Admitting: Family Medicine

## 2023-03-13 ENCOUNTER — Encounter: Payer: Self-pay | Admitting: Family Medicine

## 2023-03-14 ENCOUNTER — Encounter: Payer: Self-pay | Admitting: Family Medicine

## 2023-03-16 ENCOUNTER — Other Ambulatory Visit: Payer: Self-pay

## 2023-03-16 ENCOUNTER — Encounter: Payer: Self-pay | Admitting: Family Medicine

## 2023-03-16 ENCOUNTER — Ambulatory Visit: Payer: Self-pay | Attending: Family Medicine | Admitting: Family Medicine

## 2023-03-16 VITALS — BP 158/96 | HR 67 | Ht 66.0 in | Wt 206.0 lb

## 2023-03-16 DIAGNOSIS — Z131 Encounter for screening for diabetes mellitus: Secondary | ICD-10-CM

## 2023-03-16 DIAGNOSIS — I10 Essential (primary) hypertension: Secondary | ICD-10-CM

## 2023-03-16 DIAGNOSIS — R109 Unspecified abdominal pain: Secondary | ICD-10-CM

## 2023-03-16 DIAGNOSIS — H6123 Impacted cerumen, bilateral: Secondary | ICD-10-CM

## 2023-03-16 DIAGNOSIS — R5383 Other fatigue: Secondary | ICD-10-CM

## 2023-03-16 MED ORDER — VALSARTAN-HYDROCHLOROTHIAZIDE 320-25 MG PO TABS
1.0000 | ORAL_TABLET | Freq: Every day | ORAL | 1 refills | Status: DC
Start: 2023-03-16 — End: 2023-09-29
  Filled 2023-03-16: qty 90, 90d supply, fill #0
  Filled 2023-06-27: qty 90, 90d supply, fill #1

## 2023-03-16 NOTE — Progress Notes (Signed)
Subjective:  Patient ID: Katelyn Hardin, female    DOB: Feb 19, 1971  Age: 52 y.o. MRN: [id number]  CC: Hypertension   HPI Katelyn Hardin is a 52 y.o. year old female with a history of hypertension, vertigo, sciatica, hyperparathyroidism status post parathyroidectomy.  Interval History: She complains of fatigue and upper abdominal pain She feels like she is forcing herself to do anything. As soon as she gets home she just wants to lie down.  Her epigastric region hurts and she has a 'queasy' feeling. Her abdomen feel 'big' and she 'feels something'. She has no reflux symptoms, no abdominal pain. Se has dizziness and and is on Meclizine as needed. She could be walking and it just hits and she sways.   Endorses adherence with her antihypertensive but her blood pressure is slightly elevated Past Medical History:  Diagnosis Date   Anemia    Bacterial vaginosis    Complication of anesthesia    Slow to wake up   Family history of breast cancer    Fibroids    History of hiatal hernia    History of kidney stones    Hyperparathyroidism    Hypertension    IBS 03/10/2008   Qualifier: Diagnosis of  By: Daphine Deutscher FNP, [place]     IBS (irritable bowel syndrome)    Menopausal hot flushes 07/07/2014    Past Surgical History:  Procedure Laterality Date   PARATHYROIDECTOMY N/A 05/09/2019   Procedure: PARATHYROIDECTOMY;  Surgeon: Darnell Level, MD;  Location: [facility];  Service: General;  Laterality: N/A;   TUBAL LIGATION     TUBAL LIGATION      Family History  Problem Relation Age of Onset   Cervical cancer Mother    Thyroid disease Sister    Breast cancer Sister 56   Cancer Maternal Grandfather        unknown   Breast cancer Other        dx in her 16s; MGM's sister   Breast cancer Other        5 of MGM sisters   Diabetes Neg Hx     Social History   Socioeconomic History   Marital status: Married    Spouse name: Not on file   Number of children: 2    Years of education: Not on file   Highest education level: Associate degree: occupational, Scientist, product/process development, or vocational program  Occupational History   Not on file  Tobacco Use   Smoking status: Never   Smokeless tobacco: Never  Vaping Use   Vaping Use: Never used  Substance and Sexual Activity   Alcohol use: Yes    Alcohol/week: 6.0 standard drinks of alcohol    Types: 2 Glasses of wine, 4 Standard drinks or equivalent per week    Comment: 2 glasses of wine daily   Drug use: No   Sexual activity: Yes    Birth control/protection: Post-menopausal  Other Topics Concern   Not on file  Social History Narrative   Not on file   Social Determinants of Health   Financial Resource Strain: Not on file  Food Insecurity: No Food Insecurity (02/17/2022)   Hunger Vital Sign    Worried About Running Out of Food in the Last Year: Never true    Ran Out of Food in the Last Year: Never true  Transportation Needs: No Transportation Needs (02/17/2022)   PRAPARE - Administrator, Civil Service (Medical): No    Lack of Transportation (Non-Medical): No  Physical  Activity: Not on file  Stress: Not on file  Social Connections: Not on file    Allergies  Allergen Reactions   Acetaminophen Other (See Comments)    Jittery and upset stomach   Aspirin Nausea And Vomiting   Codeine     Jittery and upset stomach   Oxycodone-Acetaminophen     Jittery and upset stomach   Vicodin [Hydrocodone-Acetaminophen]     Jittery and upset stomach    Outpatient Medications Prior to Visit  Medication Sig Dispense Refill   azelastine (ASTELIN) 0.1 % nasal spray Insert 2 sprays into both nostrils 2 (two) times daily 30 mL 12   cyclobenzaprine (FLEXERIL) 10 MG tablet Take 1 tablet (10 mg total) by mouth 3 (three) times daily as needed for muscle spasms. 30 tablet 1   fluticasone (FLONASE) 50 MCG/ACT nasal spray Place 1 spray into both nostrils daily. 16 g 2   levocetirizine (XYZAL ALLERGY 24HR) 5 MG tablet  Take 1 tablet (5 mg total) by mouth every evening. 90 tablet 1   meclizine (ANTIVERT) 25 MG tablet Take 1 tablet (25 mg total) by mouth 3 (three) times daily as needed for dizziness. 60 tablet 3   Multiple Vitamin (MULTIVITAMIN WITH MINERALS) TABS tablet Take 1 tablet by mouth daily.     Probiotic Product (PROBIOTIC PO) Take 1 capsule by mouth daily.     valsartan-hydrochlorothiazide (DIOVAN-HCT) 160-25 MG tablet Take 1 tablet by mouth daily. 90 tablet 1   carbamide peroxide (DEBROX) 6.5 % OTIC solution Place 5 drops into both ears 2 (two) times daily. (Patient not taking: Reported on 02/17/2022) 15 mL 0   No facility-administered medications prior to visit.     ROS Review of Systems  Constitutional:  Positive for fatigue. Negative for activity change and appetite change.  HENT:  Negative for sinus pressure and sore throat.   Respiratory:  Negative for chest tightness, shortness of breath and wheezing.   Cardiovascular:  Negative for chest pain and palpitations.  Gastrointestinal:  Positive for abdominal pain. Negative for abdominal distention and constipation.  Genitourinary: Negative.   Musculoskeletal: Negative.   Neurological:  Positive for light-headedness.  Psychiatric/Behavioral:  Negative for behavioral problems and dysphoric mood.     Objective:  BP (!) 158/96   Pulse 67   Ht 5\' 6"  (1.676 m)   Wt 206 lb (93.4 kg)   LMP 11/19/2016   SpO2 100%   BMI 33.25 kg/m      03/16/2023   10:51 AM 03/16/2023   10:06 AM 11/03/2022    9:03 AM  BP/Weight  Systolic BP 158 156 149  Diastolic BP 96 104 93  Wt. (Lbs)  206 207.8  BMI  33.25 kg/m2 33.54 kg/m2    Wt Readings from Last 3 Encounters:  03/16/23 206 lb (93.4 kg)  11/03/22 207 lb 12.8 oz (94.3 kg)  06/13/22 210 lb (95.3 kg)     Physical Exam Constitutional:      Appearance: She is well-developed.  HENT:     Right Ear: External ear normal.     Left Ear: External ear normal.  Cardiovascular:     Rate and Rhythm:  Normal rate.     Heart sounds: Normal heart sounds. No murmur heard. Pulmonary:     Effort: Pulmonary effort is normal.     Breath sounds: Normal breath sounds. No wheezing or rales.  Chest:     Chest wall: No tenderness.  Abdominal:     General: Bowel sounds are normal. There is  no distension.     Palpations: Abdomen is soft. There is no mass.     Tenderness: There is no abdominal tenderness.     Comments: Distended  Musculoskeletal:        General: Normal range of motion.     Right lower leg: No edema.     Left lower leg: No edema.  Neurological:     Mental Status: She is alert and oriented to person, place, and time.  Psychiatric:        Mood and Affect: Mood normal.        Latest Ref Rng & Units 01/17/2022   12:21 PM 07/21/2021   11:40 AM 04/09/2020   10:05 AM  CMP  Glucose 70 - 99 mg/dL 88  88  83   BUN 6 - 24 mg/dL 11  13  17    Creatinine 0.57 - 1.00 mg/dL 4.09  8.11  9.14   Sodium 134 - 144 mmol/L 143  141  140   Potassium 3.5 - 5.2 mmol/L 4.3  4.1  3.9   Chloride 96 - 106 mmol/L 103  101  100   CO2 20 - 29 mmol/L 28  27  25    Calcium 8.7 - 10.2 mg/dL 9.5  9.8  9.3   Total Protein 6.0 - 8.5 g/dL 7.7  7.8  7.5   Total Bilirubin 0.0 - 1.2 mg/dL <7.8  0.2  0.3   Alkaline Phos 44 - 121 IU/L 105  98  85   AST 0 - 40 IU/L 31  23  19    ALT 0 - 32 IU/L 41  26  17     Lipid Panel     Component Value Date/Time   CHOL 213 (H) 01/17/2022 1221   TRIG 136 01/17/2022 1221   HDL 38 (L) 01/17/2022 1221   CHOLHDL 4.4 10/23/2019 1110   CHOLHDL 3.6 04/04/2013 1022   VLDL 20 04/04/2013 1022   LDLCALC 150 (H) 01/17/2022 1221    CBC    Component Value Date/Time   WBC 5.8 06/13/2022 1559   WBC 8.2 05/12/2019 2110   RBC 4.04 06/13/2022 1559   RBC 3.44 (L) 05/12/2019 2110   HGB 12.2 06/13/2022 1559   HCT 37.6 06/13/2022 1559   PLT 352 06/13/2022 1559   MCV 93 06/13/2022 1559   MCH 30.2 06/13/2022 1559   MCH 31.7 05/12/2019 2110   MCHC 32.4 06/13/2022 1559   MCHC 32.2  05/12/2019 2110   RDW 13.4 06/13/2022 1559   LYMPHSABS 3.0 06/13/2022 1559   MONOABS 0.5 02/07/2014 1711   EOSABS 0.1 06/13/2022 1559   BASOSABS 0.0 06/13/2022 1559    Lab Results  Component Value Date   HGBA1C 5.5 06/13/2022    Lab Results  Component Value Date   TSH 0.958 04/09/2020    Assessment & Plan:  1. Essential hypertension, benign Uncontrolled Increased dose of valsartan/HCTZ Counseled on blood pressure goal of less than 130/80, low-sodium, DASH diet, medication compliance, 150 minutes of moderate intensity exercise per week. Discussed medication compliance, adverse effects. - LP+Non-HDL Cholesterol - CMP14+EGFR - CBC with Differential/Platelet - valsartan-hydrochlorothiazide (DIOVAN-HCT) 320-25 MG tablet; Take 1 tablet by mouth daily.  Dispense: 90 tablet; Refill: 1  2. Bilateral impacted cerumen Advised to use Debrox This could explain her dizziness in addition to history of vertigo  3. Other fatigue Symptoms could be related to menopause but will need to exclude underlying possible etiologies - VITAMIN D 25 Hydroxy (Vit-D Deficiency, Fractures) - T4, free -  TSH - T3  4. Screening for diabetes mellitus - Hemoglobin A1c  5. Abdominal pain, unspecified abdominal location She informs me she did have a history of hernia in the past Review of last CT scan revealed small umbilical hernia containing fat It also revealed presence of fibroids which could also be contributing to her distended abdomen Advised that exercising will also help with size of abdomen. - CT Abdomen Pelvis W Contrast; Future   Meds ordered this encounter  Medications   valsartan-hydrochlorothiazide (DIOVAN-HCT) 320-25 MG tablet    Sig: Take 1 tablet by mouth daily.    Dispense:  90 tablet    Refill:  1    Discontinue 160/25    Follow-up: Return in about 1 month (around 04/15/2023) for Pap smear.       Hoy Register, MD, FAAFP. [facility]  [place], [place] [phone]   03/16/2023, 1:47 PM

## 2023-03-16 NOTE — Patient Instructions (Signed)
Exercising to Stay Healthy To become healthy and stay healthy, it is recommended that you do moderate-intensity and vigorous-intensity exercise. You can tell that you are exercising at a moderate intensity if your heart starts beating faster and you start breathing faster but can still hold a conversation. You can tell that you are exercising at a vigorous intensity if you are breathing much harder and faster and cannot hold a conversation while exercising. How can exercise benefit me? Exercising regularly is important. It has many health benefits, such as: Improving overall fitness, flexibility, and endurance. Increasing bone density. Helping with weight control. Decreasing body fat. Increasing muscle strength and endurance. Reducing stress and tension, anxiety, depression, or anger. Improving overall health. What guidelines should I follow while exercising? Before you start a new exercise program, talk with your health care provider. Do not exercise so much that you hurt yourself, feel dizzy, or get very short of breath. Wear comfortable clothes and wear shoes with good support. Drink plenty of water while you exercise to prevent dehydration or heat stroke. Work out until your breathing and your heartbeat get faster (moderate intensity). How often should I exercise? Choose an activity that you enjoy, and set realistic goals. Your health care provider can help you make an activity plan that is individually designed and works best for you. Exercise regularly as told by your health care provider. This may include: Doing strength training two times a week, such as: Lifting weights. Using resistance bands. Push-ups. Sit-ups. Yoga. Doing a certain intensity of exercise for a given amount of time. Choose from these options: A total of 150 minutes of moderate-intensity exercise every week. A total of 75 minutes of vigorous-intensity exercise every week. A mix of moderate-intensity and  vigorous-intensity exercise every week. Children, pregnant women, people who have not exercised regularly, people who are overweight, and older adults may need to talk with a health care provider about what activities are safe to perform. If you have a medical condition, be sure to talk with your health care provider before you start a new exercise program. What are some exercise ideas? Moderate-intensity exercise ideas include: Walking 1 mile (1.6 km) in about 15 minutes. Biking. Hiking. Golfing. Dancing. Water aerobics. Vigorous-intensity exercise ideas include: Walking 4.5 miles (7.2 km) or more in about 1 hour. Jogging or running 5 miles (8 km) in about 1 hour. Biking 10 miles (16.1 km) or more in about 1 hour. Lap swimming. Roller-skating or in-line skating. Cross-country skiing. Vigorous competitive sports, such as football, basketball, and soccer. Jumping rope. Aerobic dancing. What are some everyday activities that can help me get exercise? Yard work, such as: Pushing a lawn mower. Raking and bagging leaves. Washing your car. Pushing a stroller. Shoveling snow. Gardening. Washing windows or floors. How can I be more active in my day-to-day activities? Use stairs instead of an elevator. Take a walk during your lunch break. If you drive, park your car farther away from your work or school. If you take public transportation, get off one stop early and walk the rest of the way. Stand up or walk around during all of your indoor phone calls. Get up, stretch, and walk around every 30 minutes throughout the day. Enjoy exercise with a friend. Support to continue exercising will help you keep a regular routine of activity. Where to find more information You can find more information about exercising to stay healthy from: U.S. Department of Health and Human Services: www.hhs.gov Centers for Disease Control and Prevention (  CDC): www.cdc.gov Summary Exercising regularly is  important. It will improve your overall fitness, flexibility, and endurance. Regular exercise will also improve your overall health. It can help you control your weight, reduce stress, and improve your bone density. Do not exercise so much that you hurt yourself, feel dizzy, or get very short of breath. Before you start a new exercise program, talk with your health care provider. This information is not intended to replace advice given to you by your health care provider. Make sure you discuss any questions you have with your health care provider. Document Revised: 03/05/2021 Document Reviewed: 03/05/2021 Elsevier Patient Education  2023 [employer] Inc.  

## 2023-03-16 NOTE — Progress Notes (Signed)
Fatigue  Upper abdominal pain

## 2023-03-17 LAB — CBC WITH DIFFERENTIAL/PLATELET
Basophils Absolute: 0 10*3/uL (ref 0.0–0.2)
Basos: 1 %
EOS (ABSOLUTE): 0.1 10*3/uL (ref 0.0–0.4)
Eos: 1 %
Hematocrit: 40.1 % (ref 34.0–46.6)
Hemoglobin: 13.2 g/dL (ref 11.1–15.9)
Immature Grans (Abs): 0 10*3/uL (ref 0.0–0.1)
Immature Granulocytes: 0 %
Lymphocytes Absolute: 2.3 10*3/uL (ref 0.7–3.1)
Lymphs: 52 %
MCH: 30.1 pg (ref 26.6–33.0)
MCHC: 32.9 g/dL (ref 31.5–35.7)
MCV: 92 fL (ref 79–97)
Monocytes Absolute: 0.3 10*3/uL (ref 0.1–0.9)
Monocytes: 7 %
Neutrophils Absolute: 1.7 10*3/uL (ref 1.4–7.0)
Neutrophils: 39 %
Platelets: 376 10*3/uL (ref 150–450)
RBC: 4.38 x10E6/uL (ref 3.77–5.28)
RDW: 13.3 % (ref 11.7–15.4)
WBC: 4.4 10*3/uL (ref 3.4–10.8)

## 2023-03-17 LAB — CMP14+EGFR
ALT: 23 IU/L (ref 0–32)
AST: 23 IU/L (ref 0–40)
Albumin/Globulin Ratio: 1.4 (ref 1.2–2.2)
Albumin: 4.6 g/dL (ref 3.8–4.9)
Alkaline Phosphatase: 80 IU/L (ref 44–121)
BUN/Creatinine Ratio: 16 (ref 9–23)
BUN: 11 mg/dL (ref 6–24)
Bilirubin Total: 0.2 mg/dL (ref 0.0–1.2)
CO2: 26 mmol/L (ref 20–29)
Calcium: 9.7 mg/dL (ref 8.7–10.2)
Chloride: 102 mmol/L (ref 96–106)
Creatinine, Ser: 0.7 mg/dL (ref 0.57–1.00)
Globulin, Total: 3.2 g/dL (ref 1.5–4.5)
Glucose: 84 mg/dL (ref 70–99)
Potassium: 4.5 mmol/L (ref 3.5–5.2)
Sodium: 143 mmol/L (ref 134–144)
Total Protein: 7.8 g/dL (ref 6.0–8.5)
eGFR: 104 mL/min/{1.73_m2} (ref >59)

## 2023-03-17 LAB — LP+NON-HDL CHOLESTEROL
Cholesterol, Total: 204 mg/dL — ABNORMAL HIGH (ref 100–199)
HDL: 39 mg/dL — ABNORMAL LOW (ref >39)
LDL Chol Calc (NIH): 150 mg/dL — ABNORMAL HIGH (ref 0–99)
Total Non-HDL-Chol (LDL+VLDL): 165 mg/dL — ABNORMAL HIGH (ref 0–129)
Triglycerides: 83 mg/dL (ref 0–149)
VLDL Cholesterol Cal: 15 mg/dL (ref 5–40)

## 2023-03-17 LAB — VITAMIN D 25 HYDROXY (VIT D DEFICIENCY, FRACTURES): Vit D, 25-Hydroxy: 61.1 ng/mL (ref 30.0–100.0)

## 2023-03-17 LAB — HEMOGLOBIN A1C
Est. average glucose Bld gHb Est-mCnc: 114 mg/dL
Hgb A1c MFr Bld: 5.6 % (ref 4.8–5.6)

## 2023-03-17 LAB — T4, FREE: Free T4: 1.08 ng/dL (ref 0.82–1.77)

## 2023-03-17 LAB — TSH: TSH: 0.762 u[IU]/mL (ref 0.450–4.500)

## 2023-03-17 LAB — T3: T3, Total: 121 ng/dL (ref 71–180)

## 2023-03-22 ENCOUNTER — Other Ambulatory Visit: Payer: Self-pay

## 2023-03-28 ENCOUNTER — Other Ambulatory Visit: Payer: Self-pay

## 2023-03-28 ENCOUNTER — Encounter (HOSPITAL_BASED_OUTPATIENT_CLINIC_OR_DEPARTMENT_OTHER): Payer: Self-pay | Admitting: Family Medicine

## 2023-03-28 DIAGNOSIS — N3000 Acute cystitis without hematuria: Secondary | ICD-10-CM

## 2023-03-28 MED ORDER — CEPHALEXIN 500 MG PO CAPS
500.0000 mg | ORAL_CAPSULE | Freq: Two times a day (BID) | ORAL | 0 refills | Status: DC
  Filled 2023-03-28: qty 14, 7d supply, fill #0

## 2023-03-28 NOTE — Telephone Encounter (Signed)

## 2023-04-06 ENCOUNTER — Ambulatory Visit (HOSPITAL_COMMUNITY): Payer: Self-pay

## 2023-04-07 ENCOUNTER — Other Ambulatory Visit: Payer: Self-pay

## 2023-04-07 ENCOUNTER — Ambulatory Visit (HOSPITAL_COMMUNITY)
Admission: RE | Admit: 2023-04-07 | Discharge: 2023-04-07 | Disposition: A | Discharge status: Home / Self Care | Payer: Self-pay | Source: Ambulatory Visit | Attending: Family Medicine | Admitting: Family Medicine

## 2023-04-07 ENCOUNTER — Other Ambulatory Visit: Payer: Self-pay | Admitting: Family Medicine

## 2023-04-07 DIAGNOSIS — R109 Unspecified abdominal pain: Secondary | ICD-10-CM | POA: Insufficient documentation

## 2023-04-07 MED ORDER — IOHEXOL 350 MG/ML SOLN
75.0000 mL | Freq: Once | INTRAVENOUS | Status: AC | PRN
  Administered 2023-04-07: 75 mL via INTRAVENOUS

## 2023-04-07 MED ORDER — CEPHALEXIN 500 MG PO CAPS
500.0000 mg | ORAL_CAPSULE | Freq: Two times a day (BID) | ORAL | 0 refills | Status: DC
  Filled 2023-04-07: qty 10, 5d supply, fill #0

## 2023-04-10 ENCOUNTER — Other Ambulatory Visit: Payer: Self-pay | Admitting: Family Medicine

## 2023-04-10 ENCOUNTER — Encounter: Payer: Self-pay | Admitting: Family Medicine

## 2023-04-10 DIAGNOSIS — D259 Leiomyoma of uterus, unspecified: Secondary | ICD-10-CM

## 2023-04-11 ENCOUNTER — Other Ambulatory Visit: Payer: Self-pay

## 2023-04-14 ENCOUNTER — Encounter: Payer: Self-pay | Admitting: Family Medicine

## 2023-05-18 ENCOUNTER — Encounter: Payer: Self-pay | Admitting: Family Medicine

## 2023-06-08 ENCOUNTER — Other Ambulatory Visit: Payer: Self-pay

## 2023-06-08 ENCOUNTER — Other Ambulatory Visit: Payer: Self-pay | Admitting: Family Medicine

## 2023-06-08 ENCOUNTER — Encounter: Payer: Self-pay | Admitting: Family Medicine

## 2023-06-08 MED ORDER — CEPHALEXIN 500 MG PO CAPS
500.0000 mg | ORAL_CAPSULE | Freq: Two times a day (BID) | ORAL | 0 refills | Status: DC
  Filled 2023-06-08: qty 14, 7d supply, fill #0

## 2023-06-09 ENCOUNTER — Other Ambulatory Visit: Payer: Self-pay

## 2023-06-19 ENCOUNTER — Encounter (HOSPITAL_BASED_OUTPATIENT_CLINIC_OR_DEPARTMENT_OTHER): Payer: Self-pay | Admitting: Family Medicine

## 2023-06-19 DIAGNOSIS — U071 COVID-19: Secondary | ICD-10-CM

## 2023-06-20 MED ORDER — NIRMATRELVIR/RITONAVIR (PAXLOVID)TABLET
3.0000 | ORAL_TABLET | Freq: Two times a day (BID) | ORAL | 0 refills | Status: AC
  Filled 2023-06-20: qty 30, 5d supply, fill #0

## 2023-06-20 NOTE — Telephone Encounter (Signed)
Please see the MyChart message reply(ies) for my assessment and plan.    This patient gave consent for this Medical Advice Message and is aware that it may result in a bill to their insurance company, as well as the possibility of receiving a bill for a co-payment or deductible. They are an established patient, but are not seeking medical advice exclusively about a problem treated during an in person or video visit in the last seven days. I did not recommend an in person or video visit within seven days of my reply.    I spent a total of 5 minutes cumulative time within 7 days through MyChart messaging.  Enobong Newlin, MD   

## 2023-06-21 ENCOUNTER — Other Ambulatory Visit: Payer: Self-pay

## 2023-06-27 ENCOUNTER — Other Ambulatory Visit: Payer: Self-pay

## 2023-06-29 ENCOUNTER — Encounter: Payer: Self-pay | Admitting: Family Medicine

## 2023-06-30 ENCOUNTER — Other Ambulatory Visit: Payer: Self-pay

## 2023-06-30 ENCOUNTER — Other Ambulatory Visit: Payer: Self-pay | Admitting: Family Medicine

## 2023-06-30 DIAGNOSIS — Z1211 Encounter for screening for malignant neoplasm of colon: Secondary | ICD-10-CM

## 2023-08-16 ENCOUNTER — Encounter: Payer: Self-pay | Admitting: Pharmacist

## 2023-08-16 ENCOUNTER — Ambulatory Visit: Payer: Self-pay | Attending: Family Medicine | Admitting: Pharmacist

## 2023-08-16 VITALS — BP 116/76 | HR 74

## 2023-08-16 DIAGNOSIS — I10 Essential (primary) hypertension: Secondary | ICD-10-CM

## 2023-08-16 NOTE — Progress Notes (Signed)
   S:    52 y.o. female who presents for hypertension evaluation, education, and management.  PMH is significant for hypertension, vertigo, sciatica, hyperparathyroidism status post parathyroidectomy.   Patient was last seen by Primary Care Provider, Dr. Alvis Lemmings, on 03/16/2023.   At last visit, BP was 158/96 mmHg and patient's valsartan/HCTZ was increased to 320-25 mg from 160-25 mg.  Today, patient arrives in okay spirits and presents without assistance. Denies headache, blurred vision, swelling but reports dizziness sometimes.  Family/Social history:  - Tobacco Use: never  Medication adherence good. One missed dose Patient has taken BP medications today.   Current antihypertensives include: valsartan/hydrochlorothiazide 320-25 mg daily  Antihypertensives tried in the past include: clonidine, amlodipine, lisinopril  Reported home BP readings: checks BP at home but doesn't recall the readings and forgot to bring in sheet  Patient reported dietary habits:  - Patient had to return to work so did not have time to cover   Patient-reported exercise habits: does not currently exercise    ASCVD risk factors include: HTN   O:  Vitals:   08/16/23 1345  BP: 116/76  Pulse: 74    Last 3 Office BP readings: BP Readings from Last 3 Encounters:  03/16/23 (!) 158/96  11/03/22 (!) 149/93  06/13/22 (!) 143/85    BMET    Component Value Date/Time   NA 143 03/16/2023 1113   K 4.5 03/16/2023 1113   CL 102 03/16/2023 1113   CO2 26 03/16/2023 1113   GLUCOSE 84 03/16/2023 1113   GLUCOSE 96 05/13/2019 0016   BUN 11 03/16/2023 1113   CREATININE 0.70 03/16/2023 1113   CREATININE 0.90 09/15/2016 1021   CALCIUM 9.7 03/16/2023 1113   GFRNONAA 71 04/09/2020 1005   GFRNONAA >89 07/07/2014 1512   GFRAA 82 04/09/2020 1005   GFRAA >89 07/07/2014 1512    Renal function: CrCl cannot be calculated (Patient's most recent lab result is older than the maximum 21 days allowed.).  Clinical ASCVD:  No  The 10-year ASCVD risk score (Arnett DK, et al., 2019) is: 13.6%   Values used to calculate the score:     Age: 14 years     Sex: Female     Is Non-Hispanic African American: Yes     Diabetic: No     Tobacco smoker: No     Systolic Blood Pressure: 158 mmHg     Is BP treated: Yes     HDL Cholesterol: 39 mg/dL     Total Cholesterol: 204 mg/dL  Patient is participating in a Managed Medicaid Plan: No    A/P: Hypertension diagnosed, currently controlled on current medications. BP goal < 130/80 mmHg. Medication adherence appears good.  -Continue valsartan/hydrochlorothiazide 320-25 mg daily -F/u labs ordered - CMP  -Counseled on lifestyle modifications for blood pressure control including reduced dietary sodium, increased exercise, adequate sleep. -Encouraged patient to check BP at home and bring log of readings to next visit. Marland Kitchen   Results reviewed and written information provided.    Written patient instructions provided. Patient verbalized understanding of treatment plan.  Total time in face to face counseling 20 minutes.    Follow-up:  Pharmacist: does not need since within goal Recommended scheduling PCP clinic visit   Roslyn Smiling, PharmD PGY1 Pharmacy Resident 08/16/2023 1:56 PM

## 2023-08-23 ENCOUNTER — Ambulatory Visit: Payer: Self-pay | Attending: Family Medicine

## 2023-08-23 DIAGNOSIS — I10 Essential (primary) hypertension: Secondary | ICD-10-CM

## 2023-08-24 ENCOUNTER — Encounter: Payer: Self-pay | Admitting: Family Medicine

## 2023-08-24 LAB — CMP14+EGFR
ALT: 35 [IU]/L — ABNORMAL HIGH (ref 0–32)
AST: 34 [IU]/L (ref 0–40)
Albumin: 4 g/dL (ref 3.8–4.9)
Alkaline Phosphatase: 88 [IU]/L (ref 44–121)
BUN/Creatinine Ratio: 11 (ref 9–23)
BUN: 10 mg/dL (ref 6–24)
Bilirubin Total: 0.2 mg/dL (ref 0.0–1.2)
CO2: 25 mmol/L (ref 20–29)
Calcium: 8.9 mg/dL (ref 8.7–10.2)
Chloride: 104 mmol/L (ref 96–106)
Creatinine, Ser: 0.9 mg/dL (ref 0.57–1.00)
Globulin, Total: 3.1 g/dL (ref 1.5–4.5)
Glucose: 101 mg/dL — ABNORMAL HIGH (ref 70–99)
Potassium: 3.9 mmol/L (ref 3.5–5.2)
Sodium: 141 mmol/L (ref 134–144)
Total Protein: 7.1 g/dL (ref 6.0–8.5)
eGFR: 76 mL/min/{1.73_m2} (ref >59)

## 2023-08-25 ENCOUNTER — Other Ambulatory Visit: Payer: Self-pay | Admitting: Family Medicine

## 2023-08-25 DIAGNOSIS — Z1211 Encounter for screening for malignant neoplasm of colon: Secondary | ICD-10-CM

## 2023-08-25 DIAGNOSIS — Z1212 Encounter for screening for malignant neoplasm of rectum: Secondary | ICD-10-CM

## 2023-08-27 ENCOUNTER — Encounter: Payer: Self-pay | Admitting: Family Medicine

## 2023-08-28 NOTE — Telephone Encounter (Signed)
Pt was called and offered appointment and pt states that she will call the office back.

## 2023-08-29 ENCOUNTER — Telehealth: Payer: Self-pay

## 2023-08-29 NOTE — Telephone Encounter (Signed)
Pt returning missed call from Carly.  Pt is requesting a callback.    Please advise.

## 2023-08-29 NOTE — Telephone Encounter (Signed)
Copied from CRM [id number]. Topic: General - Inquiry >> Aug 28, 2023  9:53 AM Patsy Lager T wrote: Reason for CRM: patient called stated she can come in for the appt that Carlie was trying to schedule her for on Thursday 10/11 at 9:10. I was unable to schedule her as it was not available. She also wanted to do a urine specimen. Please f/u with patient.

## 2023-08-29 NOTE — Telephone Encounter (Signed)
Called patient and left voicemail for virtual appointment

## 2023-08-31 NOTE — Telephone Encounter (Signed)
Called patient and made virtual appointment

## 2023-09-04 ENCOUNTER — Encounter: Payer: Self-pay | Admitting: Family Medicine

## 2023-09-04 ENCOUNTER — Other Ambulatory Visit: Payer: Self-pay

## 2023-09-04 ENCOUNTER — Ambulatory Visit: Payer: Self-pay | Attending: Family Medicine | Admitting: Family Medicine

## 2023-09-04 DIAGNOSIS — J018 Other acute sinusitis: Secondary | ICD-10-CM

## 2023-09-04 DIAGNOSIS — R519 Headache, unspecified: Secondary | ICD-10-CM

## 2023-09-04 DIAGNOSIS — I10 Essential (primary) hypertension: Secondary | ICD-10-CM

## 2023-09-04 DIAGNOSIS — R42 Dizziness and giddiness: Secondary | ICD-10-CM

## 2023-09-04 DIAGNOSIS — R5383 Other fatigue: Secondary | ICD-10-CM

## 2023-09-04 MED ORDER — BENZONATATE 100 MG PO CAPS
100.0000 mg | ORAL_CAPSULE | Freq: Two times a day (BID) | ORAL | 0 refills | Status: DC | PRN
  Filled 2023-09-04: qty 20, 10d supply, fill #0

## 2023-09-04 MED ORDER — AMOXICILLIN-POT CLAVULANATE 875-125 MG PO TABS
1.0000 | ORAL_TABLET | Freq: Two times a day (BID) | ORAL | 0 refills | Status: DC
  Filled 2023-09-04: qty 20, 10d supply, fill #0

## 2023-09-04 MED ORDER — FLUTICASONE PROPIONATE 50 MCG/ACT NA SUSP
1.0000 | Freq: Every day | NASAL | 2 refills | Status: DC
  Filled 2023-09-04 – 2024-09-03 (×2): qty 16, 25d supply, fill #0

## 2023-09-04 NOTE — Progress Notes (Signed)
Virtual Visit via Telephone Note  I connected with Katelyn Hardin, on 09/04/2023 at 8:16 AM by telephone and verified that I am speaking with the correct person using two identifiers.   Consent: I discussed the limitations, risks, security and privacy concerns of performing an evaluation and management service by telephone and the availability of in person appointments. I also discussed with the patient that there may be a patient responsible charge related to this service. The patient expressed understanding and agreed to proceed.   Location of Patient: Home  Location of Provider: Clinic   Persons participating in Telemedicine visit: Katelyn Hardin Dr. Alvis Lemmings     History of Present Illness: Katelyn Hardin is a 52 y.o. year old female with a history of hypertension, vertigo, sciatica, hyperparathyroidism status post parathyroidectomy.    Discussed the use of AI scribe software for clinical note transcription with the patient, who gave verbal consent to proceed.  She presents with multiple complaints. The patient reports having a cold since Thursday, which started with a sore throat and progressed to a cough with phlegm. The patient also reports a headache that lasted for a week, two weeks prior to the current visit. The headache was persistent, recurring throughout the day, and was not relieved by ibuprofen. The patient also noticed tenderness and pressure in the nasal area and forehead during the headache. The patient also reports feeling dizzy, which she associates with her blood pressure medication.The patient also mentions a sensation of clogged ears and pressure in the head, particularly when changing positions, such as getting out of a van after driving for an hour.   On 2 days last week she did not take her blood pressure medication and did not experience any headaches or dizziness but during those episodes she did not check her blood  pressure either.  In the past her blood pressures at home have been in the 140s systolic.  The patient also reports feeling extremely tired and has concerns about her vitamin D levels.     Past Medical History:  Diagnosis Date   Anemia    Bacterial vaginosis    Complication of anesthesia    Slow to wake up   Family history of breast cancer    Fibroids    History of hiatal hernia    History of kidney stones    Hyperparathyroidism (HCC)    Hypertension    IBS 03/10/2008   Qualifier: Diagnosis of  By: Daphine Deutscher FNP, [place]     IBS (irritable bowel syndrome)    Menopausal hot flushes 07/07/2014   Allergies  Allergen Reactions   Acetaminophen Other (See Comments)    Jittery and upset stomach   Aspirin Nausea And Vomiting   Codeine     Jittery and upset stomach   Oxycodone-Acetaminophen     Jittery and upset stomach   Vicodin [Hydrocodone-Acetaminophen]     Jittery and upset stomach    Current Outpatient Medications on File Prior to Visit  Medication Sig Dispense Refill   azelastine (ASTELIN) 0.1 % nasal spray Insert 2 sprays into both nostrils 2 (two) times daily 30 mL 12   carbamide peroxide (DEBROX) 6.5 % OTIC solution Place 5 drops into both ears 2 (two) times daily. (Patient not taking: Reported on 02/17/2022) 15 mL 0   cephALEXin (KEFLEX) 500 MG capsule Take 1 capsule (500 mg total) by mouth 2 (two) times daily. 14 capsule 0   cyclobenzaprine (FLEXERIL) 10 MG tablet Take 1 tablet (10 mg  total) by mouth 3 (three) times daily as needed for muscle spasms. 30 tablet 1   fluticasone (FLONASE) 50 MCG/ACT nasal spray Place 1 spray into both nostrils daily. 16 g 2   levocetirizine (XYZAL ALLERGY 24HR) 5 MG tablet Take 1 tablet (5 mg total) by mouth every evening. 90 tablet 1   meclizine (ANTIVERT) 25 MG tablet Take 1 tablet (25 mg total) by mouth 3 (three) times daily as needed for dizziness. 60 tablet 3   Multiple Vitamin (MULTIVITAMIN WITH MINERALS) TABS tablet Take 1 tablet by mouth  daily.     Probiotic Product (PROBIOTIC PO) Take 1 capsule by mouth daily.     valsartan-hydrochlorothiazide (DIOVAN-HCT) 160-25 MG tablet Take 1 tablet by mouth daily. 90 tablet 1   valsartan-hydrochlorothiazide (DIOVAN-HCT) 320-25 MG tablet Take 1 tablet by mouth daily. 90 tablet 1   No current facility-administered medications on file prior to visit.    ROS: See HPI  Observations/Objective: Awake, alert, oriented x3 Not in acute distress Normal mood      Latest Ref Rng & Units 08/23/2023    9:21 AM 03/16/2023   11:13 AM 01/17/2022   12:21 PM  CMP  Glucose 70 - 99 mg/dL 161  84  88   BUN 6 - 24 mg/dL 10  11  11    Creatinine 0.57 - 1.00 mg/dL 0.96  0.45  4.09   Sodium 134 - 144 mmol/L 141  143  143   Potassium 3.5 - 5.2 mmol/L 3.9  4.5  4.3   Chloride 96 - 106 mmol/L 104  102  103   CO2 20 - 29 mmol/L 25  26  28    Calcium 8.7 - 10.2 mg/dL 8.9  9.7  9.5   Total Protein 6.0 - 8.5 g/dL 7.1  7.8  7.7   Total Bilirubin 0.0 - 1.2 mg/dL 0.2  0.2  <8.1   Alkaline Phos 44 - 121 IU/L 88  80  105   AST 0 - 40 IU/L 34  23  31   ALT 0 - 32 IU/L 35  23  41     Lipid Panel     Component Value Date/Time   CHOL 204 (H) 03/16/2023 1113   TRIG 83 03/16/2023 1113   HDL 39 (L) 03/16/2023 1113   CHOLHDL 4.4 10/23/2019 1110   CHOLHDL 3.6 04/04/2013 1022   VLDL 20 04/04/2013 1022   LDLCALC 150 (H) 03/16/2023 1113   LABVLDL 15 03/16/2023 1113    Lab Results  Component Value Date   HGBA1C 5.6 03/16/2023    Assessment and Plan:     Sinusitis Recent onset of cold symptoms including cough, phlegm, and sore throat. Prior headache and pressure in the nasal area. Ear congestion reported. No pain on tapping cheekbones or forehead. -Prescribe Augmentin for suspected sinus infection. -Prescribe Tessalon for cough. -Continue Flonase nasal spray.  Hypertension Patient reports dizziness potentially related to antihypertensive medication. Blood pressure readings have been within normal  limits. -Advise patient to restart antihypertensive medication daily. -Check blood pressure 1 hour after taking medication for the next few days to assess for potential hypotension.  Fatigue Patient reports ongoing fatigue. Previous labs including thyroid function and vitamin D were normal. Patient is peri-menopausal which can contribute to fatigue. -Order repeat labs including complete blood count, thyroid function, and vitamin D levels.  Urinary Symptoms Patient reports occasional urinary symptoms and wants to rule out UTI. -Collect urine sample for urinalysis and culture.  Follow Up Instructions: Return in about 3 months (around 12/05/2023) for Chronic medical conditions.    I discussed the assessment and treatment plan with the patient. The patient was provided an opportunity to ask questions and all were answered. The patient agreed with the plan and demonstrated an understanding of the instructions.   The patient was advised to call back or seek an in-person evaluation if the symptoms worsen or if the condition fails to improve as anticipated.     I provided 19 minutes total of non-face-to-face time during this encounter.   Hoy Register, MD, FAAFP. [facility] [place], [place] [phone]   09/04/2023, 8:16 AM

## 2023-09-04 NOTE — Patient Instructions (Signed)

## 2023-09-05 ENCOUNTER — Other Ambulatory Visit: Payer: Self-pay

## 2023-09-24 ENCOUNTER — Encounter: Payer: Self-pay | Admitting: Family Medicine

## 2023-09-25 ENCOUNTER — Other Ambulatory Visit: Payer: Self-pay | Admitting: Family Medicine

## 2023-09-25 ENCOUNTER — Other Ambulatory Visit: Payer: Self-pay

## 2023-09-25 MED ORDER — FLUCONAZOLE 150 MG PO TABS
150.0000 mg | ORAL_TABLET | Freq: Once | ORAL | 0 refills | Status: AC
  Filled 2023-09-25: qty 1, 1d supply, fill #0

## 2023-09-29 ENCOUNTER — Other Ambulatory Visit: Payer: Self-pay

## 2023-09-29 ENCOUNTER — Other Ambulatory Visit: Payer: Self-pay | Admitting: Family Medicine

## 2023-09-29 DIAGNOSIS — I10 Essential (primary) hypertension: Secondary | ICD-10-CM

## 2023-09-29 MED ORDER — VALSARTAN-HYDROCHLOROTHIAZIDE 320-25 MG PO TABS
1.0000 | ORAL_TABLET | Freq: Every day | ORAL | 1 refills | Status: DC
  Filled 2023-09-29: qty 90, 90d supply, fill #0

## 2023-10-09 ENCOUNTER — Ambulatory Visit: Payer: Self-pay | Attending: Family Medicine | Admitting: Family Medicine

## 2023-10-09 ENCOUNTER — Encounter: Payer: Self-pay | Admitting: Family Medicine

## 2023-10-09 ENCOUNTER — Other Ambulatory Visit: Payer: Self-pay

## 2023-10-09 VITALS — BP 168/103 | HR 69 | Ht 66.0 in | Wt 203.8 lb

## 2023-10-09 DIAGNOSIS — I10 Essential (primary) hypertension: Secondary | ICD-10-CM

## 2023-10-09 DIAGNOSIS — R058 Other specified cough: Secondary | ICD-10-CM

## 2023-10-09 DIAGNOSIS — M5432 Sciatica, left side: Secondary | ICD-10-CM

## 2023-10-09 DIAGNOSIS — N941 Unspecified dyspareunia: Secondary | ICD-10-CM

## 2023-10-09 DIAGNOSIS — Z780 Asymptomatic menopausal state: Secondary | ICD-10-CM

## 2023-10-09 DIAGNOSIS — M722 Plantar fascial fibromatosis: Secondary | ICD-10-CM

## 2023-10-09 DIAGNOSIS — R5383 Other fatigue: Secondary | ICD-10-CM

## 2023-10-09 MED ORDER — HYDROXYZINE HCL 25 MG PO TABS
25.0000 mg | ORAL_TABLET | Freq: Every evening | ORAL | 1 refills | Status: AC | PRN
  Filled 2023-10-09: qty 30, 30d supply, fill #0

## 2023-10-09 MED ORDER — PROMETHAZINE-DM 6.25-15 MG/5ML PO SYRP
5.0000 mL | ORAL_SOLUTION | Freq: Four times a day (QID) | ORAL | 0 refills | Status: DC | PRN
  Filled 2023-10-09: qty 118, 6d supply, fill #0

## 2023-10-09 MED ORDER — TELMISARTAN-HCTZ 80-25 MG PO TABS
1.0000 | ORAL_TABLET | Freq: Every day | ORAL | 2 refills | Status: DC
  Filled 2023-10-09: qty 30, 30d supply, fill #0
  Filled 2023-11-03 – 2023-11-04 (×2): qty 30, 30d supply, fill #1
  Filled 2023-12-11: qty 30, 30d supply, fill #2

## 2023-10-09 NOTE — Progress Notes (Signed)
Subjective:  Patient ID: Katelyn Hardin, female    DOB: 1971/03/06  Age: 52 y.o. MRN: [id number]  CC: Back Pain (Discuss BP medication/Pain in bottom of feet/fatigue)   HPI Katelyn Hardin is a 52 y.o. year old female with a history of hypertension, vertigo, sciatica, hyperparathyroidism status post parathyroidectomy.   Interval History: Discussed the use of AI scribe software for clinical note transcription with the patient, who gave verbal consent to proceed.  She presents with multiple complaints. The chief complaint is dizziness, which the patient suspects is related to her blood pressure medication. The patient reports experimenting with not taking the medication some days to see if the dizziness subsides.  She reports that even after taking her Diovan/HCTZ her blood pressure is still elevated.  Logs reveal a blood pressure ranging from 132-160 systolic 1 to 2 hours postmedication.  Pressure is elevated today and she is yet to take her antihypertensive.  The patient also reports chronic fatigue, describing it as extreme tiredness to the point of concern.  She does not get a good night sleep as she is constantly waking up to turn on and turn off the fan.  The patient also mentions experiencing anxiety, which seemed to have started around the time of menopause.The patient is also experiencing menopausal symptoms, including painful intercourse and decreased sex drive. The patient describes the pain during intercourse as if the vaginal walls are paper thin. The patient also reports a significant decrease in sex drive, describing it as "negative zero".  The patient is experiencing lower back pain that radiates to the thighs. The pain is described as a constant discomfort, not sharp, but as if it's in the bones. The pain is worse when sweeping, mopping, or standing for long periods. The patient also reports difficulty lifting her legs, describing a lack of flexibility  and tightness.  The patient also reports foot pain, particularly in the mornings. The feet feel thick and tight, causing discomfort when walking. The pain is not present throughout the day, but is most severe upon waking and during the first few steps.   Lastly, the patient mentions a persistent cough. The patient had a recent sinus infection and was prescribed Promethazine DM for which she is requesting a refill.       Past Medical History:  Diagnosis Date   Anemia    Bacterial vaginosis    Complication of anesthesia    Slow to wake up   Family history of breast cancer    Fibroids    History of hiatal hernia    History of kidney stones    Hyperparathyroidism (HCC)    Hypertension    IBS 03/10/2008   Qualifier: Diagnosis of  By: Daphine Deutscher FNP, [place]     IBS (irritable bowel syndrome)    Menopausal hot flushes 07/07/2014    Past Surgical History:  Procedure Laterality Date   PARATHYROIDECTOMY N/A 05/09/2019   Procedure: PARATHYROIDECTOMY;  Surgeon: Darnell Level, MD;  Location: [facility];  Service: General;  Laterality: N/A;   TUBAL LIGATION     TUBAL LIGATION      Family History  Problem Relation Age of Onset   Cervical cancer Mother    Thyroid disease Sister    Breast cancer Sister 30   Cancer Maternal Grandfather        unknown   Breast cancer Other        dx in her 96s; MGM's sister   Breast cancer Other  5 of MGM sisters   Diabetes Neg Hx     Social History   Socioeconomic History   Marital status: Married    Spouse name: Not on file   Number of children: 2   Years of education: Not on file   Highest education level: Associate degree: occupational, Scientist, product/process development, or vocational program  Occupational History   Not on file  Tobacco Use   Smoking status: Never   Smokeless tobacco: Never  Vaping Use   Vaping status: Never Used  Substance and Sexual Activity   Alcohol use: Yes    Alcohol/week: 6.0 standard drinks of alcohol    Types: 2 Glasses of wine, 4  Standard drinks or equivalent per week    Comment: 2 glasses of wine daily   Drug use: No   Sexual activity: Yes    Birth control/protection: Post-menopausal  Other Topics Concern   Not on file  Social History Narrative   Not on file   Social Determinants of Health   Financial Resource Strain: Low Risk  (10/09/2023)   Overall Financial Resource Strain (CARDIA)    Difficulty of Paying Living Expenses: Not hard at all  Food Insecurity: No Food Insecurity (10/09/2023)   Hunger Vital Sign    Worried About Running Out of Food in the Last Year: Never true    Ran Out of Food in the Last Year: Never true  Transportation Needs: No Transportation Needs (10/09/2023)   PRAPARE - Administrator, Civil Service (Medical): No    Lack of Transportation (Non-Medical): No  Physical Activity: Inactive (10/09/2023)   Exercise Vital Sign    Days of Exercise per Week: 0 days    Minutes of Exercise per Session: 0 min  Stress: Stress Concern Present (10/09/2023)   [employer] of Occupational Health - Occupational Stress Questionnaire    Feeling of Stress : To some extent  Social Connections: Socially Integrated (10/09/2023)   Social Connection and Isolation Panel [NHANES]    Frequency of Communication with Friends and Family: Three times a week    Frequency of Social Gatherings with Friends and Family: Once a week    Attends Religious Services: 1 to 4 times per year    Active Member of [employer] or Organizations: Yes    Attends Engineer, structural: More than 4 times per year    Marital Status: Married    Allergies  Allergen Reactions   Acetaminophen Other (See Comments)    Jittery and upset stomach   Aspirin Nausea And Vomiting   Codeine     Jittery and upset stomach   Oxycodone-Acetaminophen     Jittery and upset stomach   Vicodin [Hydrocodone-Acetaminophen]     Jittery and upset stomach    Outpatient Medications Prior to Visit  Medication Sig Dispense Refill    azelastine (ASTELIN) 0.1 % nasal spray Insert 2 sprays into both nostrils 2 (two) times daily 30 mL 12   carbamide peroxide (DEBROX) 6.5 % OTIC solution Place 5 drops into both ears 2 (two) times daily. 15 mL 0   cyclobenzaprine (FLEXERIL) 10 MG tablet Take 1 tablet (10 mg total) by mouth 3 (three) times daily as needed for muscle spasms. 30 tablet 1   fluticasone (FLONASE) 50 MCG/ACT nasal spray Place 1 spray into both nostrils daily. 16 g 2   levocetirizine (XYZAL ALLERGY 24HR) 5 MG tablet Take 1 tablet (5 mg total) by mouth every evening. 90 tablet 1   meclizine (ANTIVERT) 25 MG tablet  Take 1 tablet (25 mg total) by mouth 3 (three) times daily as needed for dizziness. 60 tablet 3   Multiple Vitamin (MULTIVITAMIN WITH MINERALS) TABS tablet Take 1 tablet by mouth daily.     Probiotic Product (PROBIOTIC PO) Take 1 capsule by mouth daily.     valsartan-hydrochlorothiazide (DIOVAN-HCT) 320-25 MG tablet Take 1 tablet by mouth daily. 90 tablet 1   amoxicillin-clavulanate (AUGMENTIN) 875-125 MG tablet Take 1 tablet by mouth 2 (two) times daily. (Patient not taking: Reported on 10/09/2023) 20 tablet 0   benzonatate (TESSALON) 100 MG capsule Take 1 capsule (100 mg total) by mouth 2 (two) times daily as needed for cough. (Patient not taking: Reported on 10/09/2023) 20 capsule 0   No facility-administered medications prior to visit.     ROS Review of Systems  Constitutional:  Positive for fatigue. Negative for activity change and appetite change.  HENT:  Negative for sinus pressure and sore throat.   Respiratory:  Positive for cough. Negative for chest tightness, shortness of breath and wheezing.   Cardiovascular:  Negative for chest pain and palpitations.  Gastrointestinal:  Negative for abdominal distention, abdominal pain and constipation.  Genitourinary: Negative.   Musculoskeletal:        See HPI  Psychiatric/Behavioral:  Positive for sleep disturbance. Negative for behavioral problems and  dysphoric mood.     Objective:  BP (!) 168/103   Pulse 69   Ht 5\' 6"  (1.676 m)   Wt 203 lb 12.8 oz (92.4 kg)   LMP 11/19/2016   SpO2 99%   BMI 32.89 kg/m      10/09/2023   10:09 AM 08/16/2023    1:45 PM 03/16/2023   10:51 AM  BP/Weight  Systolic BP 168 116 158  Diastolic BP 103 76 96  Wt. (Lbs) 203.8    BMI 32.89 kg/m2        Physical Exam Constitutional:      Appearance: She is well-developed.  Cardiovascular:     Rate and Rhythm: Normal rate.     Heart sounds: Normal heart sounds. No murmur heard. Pulmonary:     Effort: Pulmonary effort is normal.     Breath sounds: Normal breath sounds. No wheezing or rales.  Chest:     Chest wall: No tenderness.  Abdominal:     General: Bowel sounds are normal. There is no distension.     Palpations: Abdomen is soft. There is no mass.     Tenderness: There is no abdominal tenderness.  Musculoskeletal:     Lumbar back: No tenderness. Positive left straight leg raise test.     Right lower leg: No edema.     Left lower leg: No edema.  Neurological:     Mental Status: She is alert and oriented to person, place, and time.  Psychiatric:        Mood and Affect: Mood normal.        Latest Ref Rng & Units 08/23/2023    9:21 AM 03/16/2023   11:13 AM 01/17/2022   12:21 PM  CMP  Glucose 70 - 99 mg/dL 604  84  88   BUN 6 - 24 mg/dL 10  11  11    Creatinine 0.57 - 1.00 mg/dL 5.40  9.81  1.91   Sodium 134 - 144 mmol/L 141  143  143   Potassium 3.5 - 5.2 mmol/L 3.9  4.5  4.3   Chloride 96 - 106 mmol/L 104  102  103   CO2 20 -  29 mmol/L 25  26  28    Calcium 8.7 - 10.2 mg/dL 8.9  9.7  9.5   Total Protein 6.0 - 8.5 g/dL 7.1  7.8  7.7   Total Bilirubin 0.0 - 1.2 mg/dL 0.2  0.2  <0.9   Alkaline Phos 44 - 121 IU/L 88  80  105   AST 0 - 40 IU/L 34  23  31   ALT 0 - 32 IU/L 35  23  41     Lipid Panel     Component Value Date/Time   CHOL 204 (H) 03/16/2023 1113   TRIG 83 03/16/2023 1113   HDL 39 (L) 03/16/2023 1113   CHOLHDL 4.4  10/23/2019 1110   CHOLHDL 3.6 04/04/2013 1022   VLDL 20 04/04/2013 1022   LDLCALC 150 (H) 03/16/2023 1113    CBC    Component Value Date/Time   WBC 4.4 03/16/2023 1113   WBC 8.2 05/12/2019 2110   RBC 4.38 03/16/2023 1113   RBC 3.44 (L) 05/12/2019 2110   HGB 13.2 03/16/2023 1113   HCT 40.1 03/16/2023 1113   PLT 376 03/16/2023 1113   MCV 92 03/16/2023 1113   MCH 30.1 03/16/2023 1113   MCH 31.7 05/12/2019 2110   MCHC 32.9 03/16/2023 1113   MCHC 32.2 05/12/2019 2110   RDW 13.3 03/16/2023 1113   LYMPHSABS 2.3 03/16/2023 1113   MONOABS 0.5 02/07/2014 1711   EOSABS 0.1 03/16/2023 1113   BASOSABS 0.0 03/16/2023 1113    Lab Results  Component Value Date   HGBA1C 5.6 03/16/2023    Lab Results  Component Value Date   TSH 0.762 03/16/2023    Assessment & Plan:      Hypertension Uncontrolled Patient reports dizziness potentially related to current antihypertensive medication. Previous trial of amlodipine resulted in swelling. -Switch to Mycardis/hydrochlorothiazide. -Check blood pressure in 1 month to assess response to new medication. -Counseled on blood pressure goal of less than 130/80, low-sodium, DASH diet, medication compliance, 150 minutes of moderate intensity exercise per week. Discussed medication compliance, adverse effects.   Plantar fasciitis Patient reports pain in feet upon waking and during the night. No visible swelling. Possible plantar fasciitis. -Refer to podiatrist for further evaluation and treatment.  Sciatica of left side Patient reports chronic back pain, worse with certain movements and prolonged standing. Symptoms suggestive of sciatica. -Refer to physical therapy, consider water therapy. -Consider referral to rheumatologist if symptoms persist. -She has been advised to apply for the [place] financial discount to facilitate referral  Menopausal Symptoms/dyspareunia Patient reports fatigue, anxiety, and painful intercourse. Family history  of breast and cervical cancer. -Start hydroxyzine at bedtime for anxiety and to aid sleep -Vaginal estrogens might be helpful with regards to dyspareunia -Refer to gynecologist for further management of painful intercourse due to potential need for hormonal treatment.  Chronic Fatigue Patient reports chronic fatigue, associated insomnia.  -Discussed possible causes including menopause, chronic fatigue syndrome -Advised that psychotherapy, water aerobics will be beneficial -Order lab tests including kidney and liver function, electrolytes, thyroid function, vitamin D, complete blood count, and autoimmune markers. -Consider referral to rheumatologist if autoimmune markers are positive.  Persistent Cough Patient reports persistent cough following recent sinus infection.  -Refilled med promethazine DM  General Health Maintenance -Order stool test for colon cancer screening. -Schedule follow-up appointment in 1 month.          Meds ordered this encounter  Medications   telmisartan-hydrochlorothiazide (MICARDIS HCT) 80-25 MG tablet    Sig: Take  1 tablet by mouth daily.    Dispense:  30 tablet    Refill:  2    Discontinue Diovan/HCTZ   hydrOXYzine (ATARAX) 25 MG tablet    Sig: Take 1 tablet (25 mg total) by mouth at bedtime as needed.    Dispense:  30 tablet    Refill:  1   promethazine-dextromethorphan (PROMETHAZINE-DM) 6.25-15 MG/5ML syrup    Sig: Take 5 mLs by mouth 4 (four) times daily as needed for cough.    Dispense:  118 mL    Refill:  0    Follow-up: Return in about 1 month (around 11/08/2023) for Blood Pressure follow-up with PCP.    Visit required 47 minutes of patient care including median intraservice time, reviewing previous notes and test results, coordination of care, counseling the patient in addition to management of chronic medical conditions.Time also spent ordering medications, investigations and documenting in the chart.  All questions were answered to the  patient's satisfaction    Hoy Register, MD, FAAFP. [facility] [place], [place] [phone]   10/09/2023, 11:13 AM

## 2023-10-09 NOTE — Patient Instructions (Signed)
VISIT SUMMARY:  During today's visit, we discussed several of your health concerns, including dizziness, chronic fatigue, anxiety, lower back pain, foot pain, menopausal symptoms, and a persistent cough. We have made some changes to your medications and provided referrals to specialists for further evaluation and treatment.  YOUR PLAN:  -HYPERTENSION: Hypertension, or high blood pressure, can cause dizziness and other symptoms. We are switching your medication to Mycardis hydrochlorothiazide and will check your blood pressure in one month to see how you respond to the new medication.  -FOOT PAIN: Your foot pain, especially in the mornings, may be due to plantar fasciitis, which is inflammation of the tissue along the bottom of your foot. We are referring you to a podiatrist for further evaluation and treatment.  -CHRONIC BACK PAIN: Chronic back pain that radiates to your thighs may be due to sciatica, which is irritation of the nerve that runs from your lower back down your legs. We are referring you to physical therapy and considering water therapy. If your symptoms persist, we may refer you to a rheumatologist.  -MENOPAUSAL SYMPTOMS: Menopausal symptoms, including fatigue, anxiety, and painful intercourse, are common during this stage of life. We are starting you on hydroxyzine at bedtime to help with anxiety and sleep. We are also referring you to a gynecologist for further management of painful intercourse, as you may need hormonal treatment.  -CHRONIC FATIGUE: Chronic fatigue can be a sign of various underlying conditions. We are ordering lab tests to check your kidney and liver function, electrolytes, thyroid function, vitamin D levels, complete blood count, and autoimmune markers. If the autoimmune markers are positive, we may refer you to a rheumatologist.  -PERSISTENT COUGH: A persistent cough following a sinus infection can be bothersome. Continue taking your current cough medication as  needed.  -GENERAL HEALTH MAINTENANCE: We are ordering a stool test for colon cancer screening as part of your routine health maintenance. Please schedule a follow-up appointment in one month.  INSTRUCTIONS:  Please schedule a follow-up appointment in one month to check your blood pressure and review the results of your lab tests. Additionally, follow up with the podiatrist and gynecologist as referred.

## 2023-10-10 ENCOUNTER — Encounter: Payer: Self-pay | Admitting: Family Medicine

## 2023-10-13 LAB — CBC WITH DIFFERENTIAL/PLATELET
Basophils Absolute: 0 10*3/uL (ref 0.0–0.2)
Basos: 0 %
EOS (ABSOLUTE): 0.1 10*3/uL (ref 0.0–0.4)
Eos: 2 %
Hematocrit: 40.5 % (ref 34.0–46.6)
Hemoglobin: 12.9 g/dL (ref 11.1–15.9)
Immature Grans (Abs): 0 10*3/uL (ref 0.0–0.1)
Immature Granulocytes: 0 %
Lymphocytes Absolute: 2.7 10*3/uL (ref 0.7–3.1)
Lymphs: 53 %
MCH: 29.7 pg (ref 26.6–33.0)
MCHC: 31.9 g/dL (ref 31.5–35.7)
MCV: 93 fL (ref 79–97)
Monocytes Absolute: 0.4 10*3/uL (ref 0.1–0.9)
Monocytes: 7 %
Neutrophils Absolute: 1.9 10*3/uL (ref 1.4–7.0)
Neutrophils: 38 %
Platelets: 364 10*3/uL (ref 150–450)
RBC: 4.35 x10E6/uL (ref 3.77–5.28)
RDW: 13.2 % (ref 11.7–15.4)
WBC: 5.1 10*3/uL (ref 3.4–10.8)

## 2023-10-13 LAB — ANA,IFA RA DIAG PNL W/RFLX TIT/PATN
ANA Titer 1: NEGATIVE
Cyclic Citrullin Peptide Ab: 6 U (ref 0–19)
Rheumatoid fact SerPl-aCnc: <10 [IU]/mL (ref <14.0)

## 2023-10-13 LAB — HEPATIC FUNCTION PANEL
ALT: 29 [IU]/L (ref 0–32)
AST: 26 [IU]/L (ref 0–40)
Albumin: 4.4 g/dL (ref 3.8–4.9)
Alkaline Phosphatase: 107 [IU]/L (ref 44–121)
Bilirubin Total: 0.3 mg/dL (ref 0.0–1.2)
Bilirubin, Direct: 0.1 mg/dL (ref 0.00–0.40)
Total Protein: 8.1 g/dL (ref 6.0–8.5)

## 2023-10-13 LAB — SEDIMENTATION RATE: Sed Rate: 34 mm/h (ref 0–40)

## 2023-10-13 LAB — ANTI-SMITH ANTIBODY: ENA SM Ab Ser-aCnc: <0.2 AI (ref 0.0–0.9)

## 2023-10-13 LAB — TSH: TSH: 0.852 u[IU]/mL (ref 0.450–4.500)

## 2023-10-13 LAB — ANTI-DNA ANTIBODY, DOUBLE-STRANDED: dsDNA Ab: <1 [IU]/mL (ref 0–9)

## 2023-10-13 LAB — VITAMIN D 25 HYDROXY (VIT D DEFICIENCY, FRACTURES): Vit D, 25-Hydroxy: 48.5 ng/mL (ref 30.0–100.0)

## 2023-10-13 LAB — T4, FREE: Free T4: 1.02 ng/dL (ref 0.82–1.77)

## 2023-10-13 LAB — C-REACTIVE PROTEIN: CRP: 10 mg/L (ref 0–10)

## 2023-10-22 ENCOUNTER — Encounter: Payer: Self-pay | Admitting: Family Medicine

## 2023-10-23 ENCOUNTER — Other Ambulatory Visit: Payer: Self-pay | Admitting: Family Medicine

## 2023-10-23 DIAGNOSIS — R5383 Other fatigue: Secondary | ICD-10-CM

## 2023-11-04 ENCOUNTER — Other Ambulatory Visit: Payer: Self-pay

## 2023-11-05 ENCOUNTER — Encounter: Payer: Self-pay | Admitting: Family Medicine

## 2023-11-06 ENCOUNTER — Other Ambulatory Visit: Payer: Self-pay

## 2023-11-07 ENCOUNTER — Other Ambulatory Visit: Payer: Self-pay

## 2023-11-09 ENCOUNTER — Other Ambulatory Visit: Payer: Self-pay

## 2023-11-09 ENCOUNTER — Ambulatory Visit: Payer: Self-pay | Attending: Family Medicine | Admitting: Family Medicine

## 2023-11-09 ENCOUNTER — Encounter: Payer: Self-pay | Admitting: Family Medicine

## 2023-11-09 VITALS — BP 122/82 | HR 88 | Ht 66.0 in | Wt 209.0 lb

## 2023-11-09 DIAGNOSIS — H8113 Benign paroxysmal vertigo, bilateral: Secondary | ICD-10-CM

## 2023-11-09 DIAGNOSIS — I10 Essential (primary) hypertension: Secondary | ICD-10-CM

## 2023-11-09 DIAGNOSIS — N951 Menopausal and female climacteric states: Secondary | ICD-10-CM

## 2023-11-09 DIAGNOSIS — R5383 Other fatigue: Secondary | ICD-10-CM

## 2023-11-09 NOTE — Progress Notes (Signed)
Subjective:  Patient ID: Katelyn Hardin, female    DOB: [birth year]  Age: 52 y.o. MRN: [id number]  CC: Hypertension   HPI Katelyn Hardin is a 52 y.o. year old female with a history of hypertension, vertigo, sciatica, hyperparathyroidism status post parathyroidectomy.   Interval History: Discussed the use of AI scribe software for clinical note transcription with the patient, who gave verbal consent to proceed.  She presents with concerns about her blood pressure and menopausal symptoms. She reports that her blood pressure has improved since starting a Micardis/HCTZ, despite initial episodes of severe dizziness. She also describes persistent vertigo symptoms, including double vision particularly when moving her eyes quickly. She has been managing these symptoms with medication as needed, but expresses uncertainty about when to take it.  In addition to these primary concerns, she is also experiencing significant menopausal symptoms, including fatigue and "brain fog." To manage these symptoms, she has been taking a variety of supplements, including ashwagandha, omega-3, a probiotic, and zinc. She expresses frustration with the trial-and-error process of finding effective treatments and the cost of these supplements.  She would like a cortisol level checked due to her symptoms that she states she has been doing research on this.       Past Medical History:  Diagnosis Date   Anemia    Bacterial vaginosis    Complication of anesthesia    Slow to wake up   Family history of breast cancer    Fibroids    History of hiatal hernia    History of kidney stones    Hyperparathyroidism (HCC)    Hypertension    IBS 03/10/2008   Qualifier: Diagnosis of  By: Daphine Deutscher FNP, Nykedtra     IBS (irritable bowel syndrome)    Menopausal hot flushes 07/07/2014    Past Surgical History:  Procedure Laterality Date   PARATHYROIDECTOMY N/A 05/09/2019   Procedure: PARATHYROIDECTOMY;   Surgeon: Darnell Level, MD;  Location: [facility];  Service: General;  Laterality: N/A;   TUBAL LIGATION     TUBAL LIGATION      Family History  Problem Relation Age of Onset   Cervical cancer Mother    Thyroid disease Sister    Breast cancer Sister 3   Cancer Maternal Grandfather        unknown   Breast cancer Other        dx in her 50s; MGM's sister   Breast cancer Other        5 of MGM sisters   Diabetes Neg Hx     Social History   Socioeconomic History   Marital status: Married    Spouse name: Not on file   Number of children: 2   Years of education: Not on file   Highest education level: Associate degree: occupational, Scientist, product/process development, or vocational program  Occupational History   Not on file  Tobacco Use   Smoking status: Never   Smokeless tobacco: Never  Vaping Use   Vaping status: Never Used  Substance and Sexual Activity   Alcohol use: Yes    Alcohol/week: 6.0 standard drinks of alcohol    Types: 2 Glasses of wine, 4 Standard drinks or equivalent per week    Comment: 2 glasses of wine daily   Drug use: No   Sexual activity: Yes    Birth control/protection: Post-menopausal  Other Topics Concern   Not on file  Social History Narrative   Not on file   Social Drivers of Health  Financial Resource Strain: Low Risk  (10/09/2023)   Overall Financial Resource Strain (CARDIA)    Difficulty of Paying Living Expenses: Not hard at all  Food Insecurity: No Food Insecurity (10/09/2023)   Hunger Vital Sign    Worried About Running Out of Food in the Last Year: Never true    Ran Out of Food in the Last Year: Never true  Transportation Needs: No Transportation Needs (10/09/2023)   PRAPARE - Administrator, Civil Service (Medical): No    Lack of Transportation (Non-Medical): No  Physical Activity: Inactive (10/09/2023)   Exercise Vital Sign    Days of Exercise per Week: 0 days    Minutes of Exercise per Session: 0 min  Stress: Stress Concern Present  (10/09/2023)   [employer] of Occupational Health - Occupational Stress Questionnaire    Feeling of Stress : To some extent  Social Connections: Socially Integrated (10/09/2023)   Social Connection and Isolation Panel [NHANES]    Frequency of Communication with Friends and Family: Three times a week    Frequency of Social Gatherings with Friends and Family: Once a week    Attends Religious Services: 1 to 4 times per year    Active Member of [employer] or Organizations: Yes    Attends Engineer, structural: More than 4 times per year    Marital Status: Married    Allergies  Allergen Reactions   Acetaminophen Other (See Comments)    Jittery and upset stomach   Aspirin Nausea And Vomiting   Codeine     Jittery and upset stomach   Oxycodone-Acetaminophen     Jittery and upset stomach   Vicodin [Hydrocodone-Acetaminophen]     Jittery and upset stomach    Outpatient Medications Prior to Visit  Medication Sig Dispense Refill   azelastine (ASTELIN) 0.1 % nasal spray Insert 2 sprays into both nostrils 2 (two) times daily 30 mL 12   carbamide peroxide (DEBROX) 6.5 % OTIC solution Place 5 drops into both ears 2 (two) times daily. 15 mL 0   cyclobenzaprine (FLEXERIL) 10 MG tablet Take 1 tablet (10 mg total) by mouth 3 (three) times daily as needed for muscle spasms. 30 tablet 1   fluticasone (FLONASE) 50 MCG/ACT nasal spray Place 1 spray into both nostrils daily. 16 g 2   hydrOXYzine (ATARAX) 25 MG tablet Take 1 tablet (25 mg total) by mouth at bedtime as needed. 30 tablet 1   levocetirizine (XYZAL ALLERGY 24HR) 5 MG tablet Take 1 tablet (5 mg total) by mouth every evening. 90 tablet 1   meclizine (ANTIVERT) 25 MG tablet Take 1 tablet (25 mg total) by mouth 3 (three) times daily as needed for dizziness. 60 tablet 3   Multiple Vitamin (MULTIVITAMIN WITH MINERALS) TABS tablet Take 1 tablet by mouth daily.     Probiotic Product (PROBIOTIC PO) Take 1 capsule by mouth daily.      promethazine-dextromethorphan (PROMETHAZINE-DM) 6.25-15 MG/5ML syrup Take 5 mLs by mouth 4 (four) times daily as needed for cough. 118 mL 0   telmisartan-hydrochlorothiazide (MICARDIS HCT) 80-25 MG tablet Take 1 tablet by mouth daily. 30 tablet 2   amoxicillin-clavulanate (AUGMENTIN) 875-125 MG tablet Take 1 tablet by mouth 2 (two) times daily. (Patient not taking: Reported on 11/09/2023) 20 tablet 0   benzonatate (TESSALON) 100 MG capsule Take 1 capsule (100 mg total) by mouth 2 (two) times daily as needed for cough. (Patient not taking: Reported on 11/09/2023) 20 capsule 0   No facility-administered  medications prior to visit.     ROS Review of Systems  Constitutional:  Negative for activity change and appetite change.  HENT:  Negative for sinus pressure and sore throat.   Respiratory:  Negative for chest tightness, shortness of breath and wheezing.   Cardiovascular:  Negative for chest pain and palpitations.  Gastrointestinal:  Negative for abdominal distention, abdominal pain and constipation.  Genitourinary: Negative.   Musculoskeletal: Negative.   Psychiatric/Behavioral:  Negative for behavioral problems and dysphoric mood.     Objective:  BP 122/82   Pulse 88   Ht 5\' 6"  (1.676 m)   Wt 209 lb (94.8 kg)   LMP 11/19/2016   SpO2 100%   BMI 33.73 kg/m      11/09/2023    9:48 AM 10/09/2023   10:09 AM 08/16/2023    1:45 PM  BP/Weight  Systolic BP 122 168 116  Diastolic BP 82 103 76  Wt. (Lbs) 209 203.8   BMI 33.73 kg/m2 32.89 kg/m2       Physical Exam Constitutional:      Appearance: She is well-developed.  Cardiovascular:     Rate and Rhythm: Normal rate.     Heart sounds: Normal heart sounds. No murmur heard. Pulmonary:     Effort: Pulmonary effort is normal.     Breath sounds: Normal breath sounds. No wheezing or rales.  Chest:     Chest wall: No tenderness.  Abdominal:     General: Bowel sounds are normal. There is no distension.     Palpations: Abdomen is  soft. There is no mass.     Tenderness: There is no abdominal tenderness.  Musculoskeletal:        General: Normal range of motion.     Right lower leg: No edema.     Left lower leg: No edema.  Neurological:     Mental Status: She is alert and oriented to person, place, and time.  Psychiatric:        Mood and Affect: Mood normal.        Latest Ref Rng & Units 10/09/2023   10:57 AM 08/23/2023    9:21 AM 03/16/2023   11:13 AM  CMP  Glucose 70 - 99 mg/dL  956  84   BUN 6 - 24 mg/dL  10  11   Creatinine 2.13 - 1.00 mg/dL  0.86  5.78   Sodium 469 - 144 mmol/L  141  143   Potassium 3.5 - 5.2 mmol/L  3.9  4.5   Chloride 96 - 106 mmol/L  104  102   CO2 20 - 29 mmol/L  25  26   Calcium 8.7 - 10.2 mg/dL  8.9  9.7   Total Protein 6.0 - 8.5 g/dL 8.1  7.1  7.8   Total Bilirubin 0.0 - 1.2 mg/dL 0.3  0.2  0.2   Alkaline Phos 44 - 121 IU/L 107  88  80   AST 0 - 40 IU/L 26  34  23   ALT 0 - 32 IU/L 29  35  23     Lipid Panel     Component Value Date/Time   CHOL 204 (H) 03/16/2023 1113   TRIG 83 03/16/2023 1113   HDL 39 (L) 03/16/2023 1113   CHOLHDL 4.4 10/23/2019 1110   CHOLHDL 3.6 04/04/2013 1022   VLDL 20 04/04/2013 1022   LDLCALC 150 (H) 03/16/2023 1113    CBC    Component Value Date/Time   WBC 5.1 10/09/2023  1057   WBC 8.2 05/12/2019 2110   RBC 4.35 10/09/2023 1057   RBC 3.44 (L) 05/12/2019 2110   HGB 12.9 10/09/2023 1057   HCT 40.5 10/09/2023 1057   PLT 364 10/09/2023 1057   MCV 93 10/09/2023 1057   MCH 29.7 10/09/2023 1057   MCH 31.7 05/12/2019 2110   MCHC 31.9 10/09/2023 1057   MCHC 32.2 05/12/2019 2110   RDW 13.2 10/09/2023 1057   LYMPHSABS 2.7 10/09/2023 1057   MONOABS 0.5 02/07/2014 1711   EOSABS 0.1 10/09/2023 1057   BASOSABS 0.0 10/09/2023 1057    Lab Results  Component Value Date   HGBA1C 5.6 03/16/2023    Assessment & Plan:      Hypertension Improved control on current medication regimen. Initial dizziness likely due to adjustment to lower blood  pressure levels. -Continue current antihypertensive medication.  Vertigo Persistent symptoms affecting quality of life. -Refer for vestibular rehabilitation therapy. -Continue meclizine  Menopausal Symptoms Significant distress from symptoms, including fatigue and cognitive changes. -Continue current over-the-counter supplements as tolerated. -Patient request due to concerns about potential low levels contributing to symptoms. -Order fasting cortisol level test.  Follow-up Await results of cortisol level test and vestibular rehabilitation therapy referral.          No orders of the defined types were placed in this encounter.   Follow-up: Return in about 6 months (around 05/09/2024) for Chronic medical conditions.       Hoy Register, MD, FAAFP. [facility] [place], [place] [phone]   11/09/2023, 10:20 AM

## 2023-11-09 NOTE — Patient Instructions (Signed)
VISIT SUMMARY:  Miss Katelyn Hardin, during your visit today, we discussed your concerns about blood pressure, vertigo, and menopausal symptoms. Your blood pressure has improved with the new medication, although you initially experienced some dizziness. You also reported persistent vertigo and significant menopausal symptoms, including fatigue and brain fog. We reviewed your current supplements and discussed further steps to manage your symptoms.  YOUR PLAN:  -HYPERTENSION: Hypertension, or high blood pressure, is when the force of the blood against your artery walls is too high. Your blood pressure has improved with your current medication, so you should continue taking it as prescribed.  -VERTIGO: Vertigo is a sensation of spinning or dizziness. Since your symptoms are persistent and affecting your quality of life, we are referring you for vestibular rehabilitation therapy to help manage these symptoms.  Continue your meclizine in the meantime.  -MENOPAUSAL SYMPTOMS: Menopausal symptoms can include fatigue and cognitive changes like brain fog. You can continue taking your current over-the-counter supplements as tolerated to help manage these symptoms.  -CORTISOL LEVEL: Cortisol is a hormone that helps regulate various functions in your body. We will order a fasting cortisol level test to check if low cortisol levels might be contributing to your symptoms.  INSTRUCTIONS:  Please follow up after you have completed the fasting cortisol level test and after you have started vestibular rehabilitation therapy. We will review the results and adjust your treatment plan as needed.

## 2023-11-10 LAB — CORTISOL: Cortisol: 8.6 ug/dL (ref 6.2–19.4)

## 2023-11-30 ENCOUNTER — Encounter: Payer: Self-pay | Admitting: Family Medicine

## 2023-11-30 ENCOUNTER — Other Ambulatory Visit: Payer: Self-pay

## 2023-11-30 ENCOUNTER — Other Ambulatory Visit: Payer: Self-pay | Admitting: Family Medicine

## 2023-11-30 MED ORDER — CEPHALEXIN 500 MG PO CAPS
500.0000 mg | ORAL_CAPSULE | Freq: Two times a day (BID) | ORAL | 0 refills | Status: DC
  Filled 2023-11-30: qty 14, 7d supply, fill #0

## 2023-12-01 ENCOUNTER — Other Ambulatory Visit: Payer: Self-pay

## 2023-12-06 ENCOUNTER — Ambulatory Visit: Payer: Self-pay | Admitting: Family Medicine

## 2023-12-11 ENCOUNTER — Other Ambulatory Visit: Payer: Self-pay

## 2023-12-19 ENCOUNTER — Other Ambulatory Visit: Payer: Self-pay

## 2023-12-19 MED ORDER — PROGESTERONE MICRONIZED 100 MG PO CAPS
100.0000 mg | ORAL_CAPSULE | Freq: Every day | ORAL | 3 refills | Status: AC
  Filled 2023-12-19: qty 90, 90d supply, fill #0
  Filled 2024-07-22: qty 90, 90d supply, fill #1

## 2023-12-19 MED ORDER — ESTRADIOL 0.025 MG/24HR TD PTWK
MEDICATED_PATCH | TRANSDERMAL | 1 refills | Status: DC
  Filled 2023-12-19: qty 4, 28d supply, fill #0
  Filled 2024-01-08 – 2024-01-18 (×3): qty 4, 28d supply, fill #1

## 2023-12-20 ENCOUNTER — Encounter: Payer: Self-pay | Admitting: Family Medicine

## 2024-01-08 ENCOUNTER — Other Ambulatory Visit: Payer: Self-pay

## 2024-01-16 ENCOUNTER — Encounter: Payer: Self-pay | Admitting: Family Medicine

## 2024-01-17 ENCOUNTER — Other Ambulatory Visit: Payer: Self-pay

## 2024-01-17 MED ORDER — SALINE NASAL SPRAY 0.65 % NA SOLN
1.0000 | NASAL | 0 refills | Status: AC | PRN
  Filled 2024-01-17: qty 60, 12d supply, fill #0

## 2024-01-17 MED ORDER — FEXOFENADINE HCL 180 MG PO TABS
180.0000 mg | ORAL_TABLET | Freq: Every day | ORAL | 0 refills | Status: DC
  Filled 2024-01-17 – 2024-02-19 (×3): qty 30, 30d supply, fill #0

## 2024-01-18 ENCOUNTER — Other Ambulatory Visit: Payer: Self-pay

## 2024-01-18 ENCOUNTER — Other Ambulatory Visit: Payer: Self-pay | Admitting: Family Medicine

## 2024-01-18 MED ORDER — TELMISARTAN-HCTZ 80-25 MG PO TABS
1.0000 | ORAL_TABLET | Freq: Every day | ORAL | 1 refills | Status: DC
  Filled 2024-01-18: qty 30, 30d supply, fill #0
  Filled 2024-02-19: qty 30, 30d supply, fill #1
  Filled 2024-02-19: qty 90, 90d supply, fill #1

## 2024-01-19 ENCOUNTER — Other Ambulatory Visit: Payer: Self-pay

## 2024-01-24 ENCOUNTER — Encounter: Payer: Self-pay | Admitting: Obstetrics and Gynecology

## 2024-01-24 LAB — FECAL OCCULT BLOOD, GUAIAC: Fecal Occult Blood: NEGATIVE

## 2024-01-24 LAB — FECAL OCCULT BLOOD, IMMUNOCHEMICAL: IFOBT: NEGATIVE

## 2024-01-28 ENCOUNTER — Encounter: Payer: Self-pay | Admitting: Family Medicine

## 2024-01-28 LAB — FECAL OCCULT BLOOD, IMMUNOCHEMICAL: Fecal Occult Bld: NEGATIVE

## 2024-01-29 ENCOUNTER — Encounter: Payer: Self-pay | Admitting: Obstetrics & Gynecology

## 2024-01-29 ENCOUNTER — Other Ambulatory Visit: Payer: Self-pay

## 2024-01-31 ENCOUNTER — Other Ambulatory Visit: Payer: Self-pay

## 2024-01-31 MED ORDER — ESTRADIOL 0.0375 MG/24HR TD PTWK
MEDICATED_PATCH | TRANSDERMAL | 8 refills | Status: DC
  Filled 2024-01-31: qty 4, 28d supply, fill #0
  Filled 2024-02-19 – 2024-02-21 (×2): qty 4, 28d supply, fill #1
  Filled 2024-03-13 – 2024-03-27 (×2): qty 4, 28d supply, fill #2
  Filled 2024-04-24: qty 4, 28d supply, fill #3
  Filled 2024-05-27: qty 4, 28d supply, fill #4
  Filled 2024-06-10 – 2024-06-17 (×2): qty 4, 28d supply, fill #5

## 2024-02-05 ENCOUNTER — Other Ambulatory Visit: Payer: Self-pay

## 2024-02-19 ENCOUNTER — Other Ambulatory Visit: Payer: Self-pay

## 2024-02-21 ENCOUNTER — Other Ambulatory Visit: Payer: Self-pay

## 2024-02-22 ENCOUNTER — Other Ambulatory Visit: Payer: Self-pay

## 2024-03-13 ENCOUNTER — Other Ambulatory Visit: Payer: Self-pay

## 2024-03-22 ENCOUNTER — Other Ambulatory Visit: Payer: Self-pay

## 2024-03-27 ENCOUNTER — Other Ambulatory Visit: Payer: Self-pay

## 2024-03-28 ENCOUNTER — Other Ambulatory Visit: Payer: Self-pay

## 2024-03-31 ENCOUNTER — Encounter: Payer: Self-pay | Admitting: Family Medicine

## 2024-04-01 ENCOUNTER — Other Ambulatory Visit: Payer: Self-pay | Admitting: Family Medicine

## 2024-04-01 ENCOUNTER — Other Ambulatory Visit: Payer: Self-pay

## 2024-04-01 MED ORDER — CEPHALEXIN 500 MG PO CAPS
500.0000 mg | ORAL_CAPSULE | Freq: Two times a day (BID) | ORAL | 0 refills | Status: DC
  Filled 2024-04-01: qty 14, 7d supply, fill #0

## 2024-04-05 ENCOUNTER — Other Ambulatory Visit: Payer: Self-pay

## 2024-04-24 ENCOUNTER — Other Ambulatory Visit: Payer: Self-pay

## 2024-04-26 ENCOUNTER — Other Ambulatory Visit: Payer: Self-pay

## 2024-05-09 ENCOUNTER — Encounter: Payer: Self-pay | Admitting: Family Medicine

## 2024-05-09 ENCOUNTER — Ambulatory Visit: Payer: Self-pay | Attending: Family Medicine | Admitting: Family Medicine

## 2024-05-09 ENCOUNTER — Other Ambulatory Visit: Payer: Self-pay

## 2024-05-09 VITALS — BP 143/85 | HR 70 | Ht 66.0 in | Wt 208.2 lb

## 2024-05-09 DIAGNOSIS — R399 Unspecified symptoms and signs involving the genitourinary system: Secondary | ICD-10-CM

## 2024-05-09 DIAGNOSIS — H8113 Benign paroxysmal vertigo, bilateral: Secondary | ICD-10-CM

## 2024-05-09 DIAGNOSIS — Z131 Encounter for screening for diabetes mellitus: Secondary | ICD-10-CM

## 2024-05-09 DIAGNOSIS — Z6833 Body mass index (BMI) 33.0-33.9, adult: Secondary | ICD-10-CM

## 2024-05-09 DIAGNOSIS — E663 Overweight: Secondary | ICD-10-CM

## 2024-05-09 DIAGNOSIS — I10 Essential (primary) hypertension: Secondary | ICD-10-CM

## 2024-05-09 DIAGNOSIS — N951 Menopausal and female climacteric states: Secondary | ICD-10-CM

## 2024-05-09 LAB — POCT URINALYSIS DIP (CLINITEK)
Bilirubin, UA: NEGATIVE
Blood, UA: NEGATIVE
Glucose, UA: NEGATIVE mg/dL
Ketones, POC UA: NEGATIVE mg/dL
Nitrite, UA: NEGATIVE
POC PROTEIN,UA: NEGATIVE
Spec Grav, UA: 1.02 (ref 1.010–1.025)
Urobilinogen, UA: 1 U/dL
pH, UA: 6 (ref 5.0–8.0)

## 2024-05-09 MED ORDER — TELMISARTAN-HCTZ 80-25 MG PO TABS
1.0000 | ORAL_TABLET | Freq: Every day | ORAL | 1 refills | Status: AC
Start: 2024-05-09 — End: ?
  Filled 2024-05-09 – 2024-05-27 (×2): qty 90, 90d supply, fill #0

## 2024-05-09 NOTE — Progress Notes (Signed)
 Subjective:  Patient ID: Katelyn Hardin, female    DOB: 1971-07-27  Age: 53 y.o. MRN: [id number]  CC: Medical Management of Chronic Issues (Cloudy urine/Weight loss medication)     Discussed the use of AI scribe software for clinical note transcription with the patient, who gave verbal consent to proceed.  History of Present Illness Katelyn Hardin is a 53 year old female with  a history of hypertension, vertigo, sciatica, hyperparathyroidism status post parathyroidectomy  hypertension who presents for a follow-up visit.  She is actively trying to lose weight and feels better with the Climara  patch after a dosage adjustment by her GYN and is also taking Prometrium .   She is interested in GLP-1 injections for weight loss.  In the past I had referred her to weight management but it was too expensive.  She is considering Medicaid for weight management program costs and seeks a referral to [facility]. She attempts to incorporate physical activity like walking and biking but struggles with consistency.  She experiences urinary symptoms, including cloudy and dark urine, without frequency or burning, which she attributes to excessive soda consumption. She is trying to balance her fluid intake with carbonated water.  She underwent parathyroid  surgery in 2020 due to elevated calcium  levels and is concerned about past lab results not being communicated.  Her current medications include Flonase , Allegra , Prometrium  with the Climara  patch, hydrochlorothiazide , and Flexeril  as needed. Her blood pressure feels controlled at home with her current regimen, though it was slightly elevated today. She takes meclizine  as needed for dizziness, which has improved.      Past Medical History:  Diagnosis Date   Anemia    Bacterial vaginosis    Complication of anesthesia    Slow to wake up   Family history of breast cancer    Fibroids    History of hiatal hernia     History of kidney stones    Hyperparathyroidism (HCC)    Hypertension    IBS 03/10/2008   Qualifier: Diagnosis of  By: Gaylyn Keas FNP, Nykedtra     IBS (irritable bowel syndrome)    Menopausal hot flushes 07/07/2014    Past Surgical History:  Procedure Laterality Date   PARATHYROIDECTOMY N/A 05/09/2019   Procedure: PARATHYROIDECTOMY;  Surgeon: Oralee Billow, MD;  Location: [facility];  Service: General;  Laterality: N/A;   TUBAL LIGATION     TUBAL LIGATION      Family History  Problem Relation Age of Onset   Cervical cancer Mother    Thyroid  disease Sister    Breast cancer Sister 31   Cancer Maternal Grandfather        unknown   Breast cancer Other        dx in her 54s; MGM's sister   Breast cancer Other        5 of MGM sisters   Diabetes Neg Hx     Social History   Socioeconomic History   Marital status: Married    Spouse name: Not on file   Number of children: 2   Years of education: Not on file   Highest education level: Associate degree: occupational, Scientist, product/process development, or vocational program  Occupational History   Not on file  Tobacco Use   Smoking status: Never   Smokeless tobacco: Never  Vaping Use   Vaping status: Never Used  Substance and Sexual Activity   Alcohol use: Yes    Alcohol/week: 6.0 standard drinks of alcohol    Types: 2  Glasses of wine, 4 Standard drinks or equivalent per week    Comment: 2 glasses of wine daily   Drug use: No   Sexual activity: Yes    Birth control/protection: Post-menopausal  Other Topics Concern   Not on file  Social History Narrative   Not on file   Social Drivers of Health   Financial Resource Strain: Low Risk  (10/09/2023)   Overall Financial Resource Strain (CARDIA)    Difficulty of Paying Living Expenses: Not hard at all  Food Insecurity: No Food Insecurity (10/09/2023)   Hunger Vital Sign    Worried About Running Out of Food in the Last Year: Never true    Ran Out of Food in the Last Year: Never true  Transportation  Needs: No Transportation Needs (10/09/2023)   PRAPARE - Administrator, Civil Service (Medical): No    Lack of Transportation (Non-Medical): No  Physical Activity: Inactive (10/09/2023)   Exercise Vital Sign    Days of Exercise per Week: 0 days    Minutes of Exercise per Session: 0 min  Stress: Stress Concern Present (10/09/2023)   [employer] of Occupational Health - Occupational Stress Questionnaire    Feeling of Stress : To some extent  Social Connections: Socially Integrated (10/09/2023)   Social Connection and Isolation Panel    Frequency of Communication with Friends and Family: Three times a week    Frequency of Social Gatherings with Friends and Family: Once a week    Attends Religious Services: 1 to 4 times per year    Active Member of [employer] or Organizations: Yes    Attends Engineer, structural: More than 4 times per year    Marital Status: Married    Allergies  Allergen Reactions   Acetaminophen  Other (See Comments)    Jittery and upset stomach   Aspirin Nausea And Vomiting   Codeine     Jittery and upset stomach   Oxycodone -Acetaminophen      Jittery and upset stomach   Vicodin [Hydrocodone -Acetaminophen ]     Jittery and upset stomach    Outpatient Medications Prior to Visit  Medication Sig Dispense Refill   azelastine  (ASTELIN ) 0.1 % nasal spray Insert 2 sprays into both nostrils 2 (two) times daily 30 mL 12   carbamide peroxide (DEBROX) 6.5 % OTIC solution Place 5 drops into both ears 2 (two) times daily. 15 mL 0   cyclobenzaprine  (FLEXERIL ) 10 MG tablet Take 1 tablet (10 mg total) by mouth 3 (three) times daily as needed for muscle spasms. 30 tablet 1   estradiol  (CLIMARA  - DOSED IN MG/24 HR) 0.0375 mg/24hr patch Place 1 patch on the skin once a week. 6 patch 8   fexofenadine  (ALLEGRA ) 180 MG tablet Take 1 tablet (180 mg total) by mouth daily. 30 tablet 0   fluticasone  (FLONASE ) 50 MCG/ACT nasal spray Place 1 spray into both nostrils  daily. 16 g 2   hydrOXYzine  (ATARAX ) 25 MG tablet Take 1 tablet (25 mg total) by mouth at bedtime as needed. 30 tablet 1   levocetirizine (XYZAL  ALLERGY 24HR) 5 MG tablet Take 1 tablet (5 mg total) by mouth every evening. 90 tablet 1   meclizine  (ANTIVERT ) 25 MG tablet Take 1 tablet (25 mg total) by mouth 3 (three) times daily as needed for dizziness. 60 tablet 3   Multiple Vitamin (MULTIVITAMIN WITH MINERALS) TABS tablet Take 1 tablet by mouth daily.     Probiotic Product (PROBIOTIC PO) Take 1 capsule by mouth daily.  progesterone  (PROMETRIUM ) 100 MG capsule Take 1 capsule (100 mg total) by mouth daily. 90 capsule 3   sodium chloride  (ALTAMIST SPRAY) 0.65 % nasal spray Place 1 spray into the nose every 2 (two) hours as needed for congestion 60 mL 0   cephALEXin  (KEFLEX ) 500 MG capsule Take 1 capsule (500 mg total) by mouth 2 (two) times daily. 14 capsule 0   promethazine -dextromethorphan (PROMETHAZINE -DM) 6.25-15 MG/5ML syrup Take 5 mLs by mouth 4 (four) times daily as needed for cough. 118 mL 0   telmisartan -hydrochlorothiazide  (MICARDIS  HCT) 80-25 MG tablet Take 1 tablet by mouth daily. 90 tablet 1   benzonatate  (TESSALON ) 100 MG capsule Take 1 capsule (100 mg total) by mouth 2 (two) times daily as needed for cough. (Patient not taking: Reported on 05/09/2024) 20 capsule 0   No facility-administered medications prior to visit.     ROS Review of Systems  Constitutional:  Negative for activity change and appetite change.  HENT:  Negative for sinus pressure and sore throat.   Respiratory:  Negative for chest tightness, shortness of breath and wheezing.   Cardiovascular:  Negative for chest pain and palpitations.  Gastrointestinal:  Negative for abdominal distention, abdominal pain and constipation.  Genitourinary: Negative.   Musculoskeletal: Negative.   Psychiatric/Behavioral:  Negative for behavioral problems and dysphoric mood.     Objective:  BP (!) 143/85   Pulse 70   Ht 5' 6  (1.676 m)   Wt 208 lb 3.2 oz (94.4 kg)   LMP 11/19/2016   SpO2 99%   BMI 33.60 kg/m      05/09/2024   11:23 AM 05/09/2024   10:44 AM 11/09/2023    9:48 AM  BP/Weight  Systolic BP 143 146 122  Diastolic BP 85 89 82  Wt. (Lbs)  208.2 209  BMI  33.6 kg/m2 33.73 kg/m2    Wt Readings from Last 3 Encounters:  05/09/24 208 lb 3.2 oz (94.4 kg)  11/09/23 209 lb (94.8 kg)  10/09/23 203 lb 12.8 oz (92.4 kg)      Physical Exam Constitutional:      Appearance: She is well-developed.   Cardiovascular:     Rate and Rhythm: Normal rate.     Heart sounds: Normal heart sounds. No murmur heard. Pulmonary:     Effort: Pulmonary effort is normal.     Breath sounds: Normal breath sounds. No wheezing or rales.  Chest:     Chest wall: No tenderness.  Abdominal:     General: Bowel sounds are normal. There is no distension.     Palpations: Abdomen is soft. There is no mass.     Tenderness: There is no abdominal tenderness.   Musculoskeletal:        General: Normal range of motion.     Right lower leg: No edema.     Left lower leg: No edema.   Neurological:     Mental Status: She is alert and oriented to person, place, and time.   Psychiatric:        Mood and Affect: Mood normal.        Latest Ref Rng & Units 10/09/2023   10:57 AM 08/23/2023    9:21 AM 03/16/2023   11:13 AM  CMP  Glucose 70 - 99 mg/dL  829  84   BUN 6 - 24 mg/dL  10  11   Creatinine 5.62 - 1.00 mg/dL  1.30  8.65   Sodium 784 - 144 mmol/L  141  143  Potassium 3.5 - 5.2 mmol/L  3.9  4.5   Chloride 96 - 106 mmol/L  104  102   CO2 20 - 29 mmol/L  25  26   Calcium  8.7 - 10.2 mg/dL  8.9  9.7   Total Protein 6.0 - 8.5 g/dL 8.1  7.1  7.8   Total Bilirubin 0.0 - 1.2 mg/dL 0.3  0.2  0.2   Alkaline Phos 44 - 121 IU/L 107  88  80   AST 0 - 40 IU/L 26  34  23   ALT 0 - 32 IU/L 29  35  23     Lipid Panel     Component Value Date/Time   CHOL 204 (H) 03/16/2023 1113   TRIG 83 03/16/2023 1113   HDL 39 (L)  03/16/2023 1113   CHOLHDL 4.4 10/23/2019 1110   CHOLHDL 3.6 04/04/2013 1022   VLDL 20 04/04/2013 1022   LDLCALC 150 (H) 03/16/2023 1113    CBC    Component Value Date/Time   WBC 5.1 10/09/2023 1057   WBC 8.2 05/12/2019 2110   RBC 4.35 10/09/2023 1057   RBC 3.44 (L) 05/12/2019 2110   HGB 12.9 10/09/2023 1057   HCT 40.5 10/09/2023 1057   PLT 364 10/09/2023 1057   MCV 93 10/09/2023 1057   MCH 29.7 10/09/2023 1057   MCH 31.7 05/12/2019 2110   MCHC 31.9 10/09/2023 1057   MCHC 32.2 05/12/2019 2110   RDW 13.2 10/09/2023 1057   LYMPHSABS 2.7 10/09/2023 1057   MONOABS 0.5 02/07/2014 1711   EOSABS 0.1 10/09/2023 1057   BASOSABS 0.0 10/09/2023 1057    Lab Results  Component Value Date   HGBA1C 5.6 03/16/2023      1. Essential hypertension, benign Slightly above goal Home blood pressure readings have been normal hence I will make no regimen change today - telmisartan -hydrochlorothiazide  (MICARDIS  HCT) 80-25 MG tablet; Take 1 tablet by mouth daily.  Dispense: 90 tablet; Refill: 1 - CMP14+EGFR - LP+Non-HDL Cholesterol  2. Urinary symptom or sign (Primary) UA reveals trace nitrates - POCT URINALYSIS DIP (CLINITEK) - Urine Culture  3. Menopause syndrome Improved on HRT prescribed by GYN Continue Climara  and Prometrium   4. Benign paroxysmal positional vertigo, bilateral Resume meclizine  as needed  5. Screening for diabetes mellitus - Hemoglobin A1c  6.  Mild obesity Counseled on need for consistency with regards to exercise and dietary modifications - Amb Ref to Medical Weight Management    Meds ordered this encounter  Medications   telmisartan -hydrochlorothiazide  (MICARDIS  HCT) 80-25 MG tablet    Sig: Take 1 tablet by mouth daily.    Dispense:  90 tablet    Refill:  1    Follow-up: Return in about 6 months (around 11/08/2024) for Chronic medical conditions.       Joaquin Mulberry, MD, FAAFP. [facility] [place],  [place] [phone]   05/09/2024, 12:50 PM

## 2024-05-09 NOTE — Patient Instructions (Signed)
 VISIT SUMMARY:  Today, you came in for a follow-up visit to discuss your hypertension, weight management, urinary symptoms, and other health concerns. We reviewed your current medications and discussed your progress and any new symptoms you are experiencing.  YOUR PLAN:  -WEIGHT MANAGEMENT: You are working on losing weight and are interested in GLP-1 injections, which are typically used for diabetes. We discussed weight management programs and applying for Medicaid to help with costs. You will be referred to [facility] for further evaluation and treatment options.  -URINARY SYMPTOMS: You have been experiencing cloudy and dark urine, which may indicate a urinary tract infection (UTI). We will send your urine for a culture test and treat you for a UTI if the test is positive.  -HYPERTENSION: Your blood pressure was slightly elevated today at 149 mmHg, but you feel it is controlled with your current medication. We will recheck your blood pressure during the visit and encourage you to monitor it regularly at home.  -VERTIGO: Your dizziness has improved with the use of Meclizine  as needed. Continue taking Meclizine  when you experience dizziness.  -MENOPAUSAL SYMPTOMS: Your menopausal symptoms have improved with the [place]  patch and Prometrium . We will ensure you are up-to-date with your mammograms due to hormone therapy and discuss the duration of your hormone therapy with your gynecologist.  -GENERAL HEALTH MAINTENANCE: You are due for routine blood work to check your cholesterol, kidney, and liver function. You are also behind on your mammogram, so please reschedule it. You completed a stool test for colon cancer screening in March.  INSTRUCTIONS:  Please follow up with [facility] for your weight management plan. We will send your urine for a culture test and notify you of the results. Continue monitoring your blood pressure at home and take Meclizine  as needed for  dizziness. Ensure you are up-to-date with your mammograms and complete the routine blood work we discussed. We will monitor your lab results and communicate any concerns via MyChart. Schedule follow-up appointments based on your lab results.

## 2024-05-10 ENCOUNTER — Other Ambulatory Visit: Payer: Self-pay

## 2024-05-10 ENCOUNTER — Ambulatory Visit: Payer: Self-pay | Admitting: Family Medicine

## 2024-05-10 LAB — LP+NON-HDL CHOLESTEROL
Cholesterol, Total: 196 mg/dL (ref 100–199)
HDL: 38 mg/dL — ABNORMAL LOW (ref >39)
LDL Chol Calc (NIH): 139 mg/dL — ABNORMAL HIGH (ref 0–99)
Total Non-HDL-Chol (LDL+VLDL): 158 mg/dL — ABNORMAL HIGH (ref 0–129)
Triglycerides: 102 mg/dL (ref 0–149)
VLDL Cholesterol Cal: 19 mg/dL (ref 5–40)

## 2024-05-10 LAB — CMP14+EGFR
ALT: 17 IU/L (ref 0–32)
AST: 18 IU/L (ref 0–40)
Albumin: 4.2 g/dL (ref 3.8–4.9)
Alkaline Phosphatase: 81 IU/L (ref 44–121)
BUN/Creatinine Ratio: 12 (ref 9–23)
BUN: 12 mg/dL (ref 6–24)
Bilirubin Total: 0.4 mg/dL (ref 0.0–1.2)
CO2: 22 mmol/L (ref 20–29)
Calcium: 9.2 mg/dL (ref 8.7–10.2)
Chloride: 101 mmol/L (ref 96–106)
Creatinine, Ser: 0.98 mg/dL (ref 0.57–1.00)
Globulin, Total: 2.9 g/dL (ref 1.5–4.5)
Glucose: 94 mg/dL (ref 70–99)
Potassium: 3.6 mmol/L (ref 3.5–5.2)
Sodium: 139 mmol/L (ref 134–144)
Total Protein: 7.1 g/dL (ref 6.0–8.5)
eGFR: 69 mL/min/{1.73_m2} (ref >59)

## 2024-05-10 LAB — HEMOGLOBIN A1C
Est. average glucose Bld gHb Est-mCnc: 105 mg/dL
Hgb A1c MFr Bld: 5.3 % (ref 4.8–5.6)

## 2024-05-10 MED ORDER — ATORVASTATIN CALCIUM 20 MG PO TABS
20.0000 mg | ORAL_TABLET | Freq: Every day | ORAL | 1 refills | Status: DC
  Filled 2024-05-10: qty 90, 90d supply, fill #0
  Filled 2024-11-10: qty 90, 90d supply, fill #1

## 2024-05-11 LAB — URINE CULTURE

## 2024-05-15 ENCOUNTER — Other Ambulatory Visit: Payer: Self-pay

## 2024-05-27 ENCOUNTER — Other Ambulatory Visit: Payer: Self-pay

## 2024-05-27 DIAGNOSIS — R0602 Shortness of breath: Secondary | ICD-10-CM | POA: Diagnosis not present

## 2024-05-27 DIAGNOSIS — R030 Elevated blood-pressure reading, without diagnosis of hypertension: Secondary | ICD-10-CM | POA: Diagnosis not present

## 2024-05-27 DIAGNOSIS — Z131 Encounter for screening for diabetes mellitus: Secondary | ICD-10-CM | POA: Diagnosis not present

## 2024-05-27 DIAGNOSIS — R5383 Other fatigue: Secondary | ICD-10-CM | POA: Diagnosis not present

## 2024-05-27 DIAGNOSIS — E6609 Other obesity due to excess calories: Secondary | ICD-10-CM | POA: Diagnosis not present

## 2024-05-27 DIAGNOSIS — Z13220 Encounter for screening for lipoid disorders: Secondary | ICD-10-CM | POA: Diagnosis not present

## 2024-05-27 DIAGNOSIS — Z6833 Body mass index (BMI) 33.0-33.9, adult: Secondary | ICD-10-CM | POA: Diagnosis not present

## 2024-05-28 ENCOUNTER — Other Ambulatory Visit: Payer: Self-pay | Admitting: Family Medicine

## 2024-05-28 ENCOUNTER — Other Ambulatory Visit: Payer: Self-pay

## 2024-05-28 DIAGNOSIS — Z1231 Encounter for screening mammogram for malignant neoplasm of breast: Secondary | ICD-10-CM

## 2024-05-28 MED ORDER — VALSARTAN-HYDROCHLOROTHIAZIDE 80-12.5 MG PO TABS
1.0000 | ORAL_TABLET | Freq: Every day | ORAL | 1 refills | Status: DC
  Filled 2024-05-28 – 2024-06-10 (×2): qty 90, 90d supply, fill #0
  Filled 2024-10-19: qty 90, 90d supply, fill #1

## 2024-05-29 ENCOUNTER — Other Ambulatory Visit: Payer: Self-pay

## 2024-05-31 ENCOUNTER — Ambulatory Visit
Admission: RE | Admit: 2024-05-31 | Discharge: 2024-05-31 | Disposition: A | Discharge status: Home / Self Care | Source: Ambulatory Visit | Attending: Family Medicine | Admitting: Family Medicine

## 2024-05-31 DIAGNOSIS — Z1231 Encounter for screening mammogram for malignant neoplasm of breast: Secondary | ICD-10-CM | POA: Diagnosis not present

## 2024-06-05 ENCOUNTER — Ambulatory Visit: Payer: Self-pay | Admitting: Family Medicine

## 2024-06-06 ENCOUNTER — Other Ambulatory Visit: Payer: Self-pay

## 2024-06-10 ENCOUNTER — Other Ambulatory Visit: Payer: Self-pay

## 2024-06-10 ENCOUNTER — Ambulatory Visit: Attending: Family Medicine

## 2024-06-10 DIAGNOSIS — Z111 Encounter for screening for respiratory tuberculosis: Secondary | ICD-10-CM | POA: Diagnosis not present

## 2024-06-10 DIAGNOSIS — Z23 Encounter for immunization: Secondary | ICD-10-CM

## 2024-06-11 ENCOUNTER — Ambulatory Visit

## 2024-06-11 NOTE — Progress Notes (Signed)
 Tuberculin skin test applied to right ventral forearm.

## 2024-06-12 ENCOUNTER — Ambulatory Visit: Attending: Family Medicine

## 2024-06-12 ENCOUNTER — Other Ambulatory Visit: Payer: Self-pay

## 2024-06-12 DIAGNOSIS — Z111 Encounter for screening for respiratory tuberculosis: Secondary | ICD-10-CM | POA: Diagnosis not present

## 2024-06-12 LAB — TB SKIN TEST
Induration: 4 mm
TB Skin Test: NEGATIVE

## 2024-06-12 NOTE — Progress Notes (Signed)
 Quantiferon gold obtained

## 2024-06-16 LAB — QUANTIFERON-TB GOLD PLUS
QuantiFERON Mitogen Value: >10 [IU]/mL
QuantiFERON Nil Value: 0.04 [IU]/mL
QuantiFERON TB1 Ag Value: 0.05 [IU]/mL
QuantiFERON TB2 Ag Value: 0.03 [IU]/mL
QuantiFERON-TB Gold Plus: NEGATIVE

## 2024-06-17 ENCOUNTER — Ambulatory Visit: Payer: Self-pay | Admitting: Family Medicine

## 2024-06-19 ENCOUNTER — Other Ambulatory Visit: Payer: Self-pay

## 2024-06-20 ENCOUNTER — Encounter: Payer: Self-pay | Admitting: Family Medicine

## 2024-06-20 ENCOUNTER — Other Ambulatory Visit: Payer: Self-pay

## 2024-06-20 ENCOUNTER — Other Ambulatory Visit: Payer: Self-pay | Admitting: Family Medicine

## 2024-06-20 DIAGNOSIS — H8113 Benign paroxysmal vertigo, bilateral: Secondary | ICD-10-CM

## 2024-06-20 MED ORDER — MECLIZINE HCL 25 MG PO TABS
25.0000 mg | ORAL_TABLET | Freq: Three times a day (TID) | ORAL | 0 refills | Status: DC | PRN
  Filled 2024-06-20: qty 60, 20d supply, fill #0

## 2024-06-21 ENCOUNTER — Other Ambulatory Visit: Payer: Self-pay

## 2024-06-21 ENCOUNTER — Other Ambulatory Visit: Payer: Self-pay | Admitting: Family Medicine

## 2024-06-21 MED ORDER — ESTRADIOL 0.0375 MG/24HR TD PTTW
1.0000 | MEDICATED_PATCH | TRANSDERMAL | 3 refills | Status: DC
  Filled 2024-06-21: qty 8, 28d supply, fill #0
  Filled 2024-07-22: qty 8, 28d supply, fill #1

## 2024-06-21 MED ORDER — ONDANSETRON HCL 4 MG PO TABS
4.0000 mg | ORAL_TABLET | Freq: Three times a day (TID) | ORAL | 0 refills | Status: DC | PRN
  Filled 2024-06-21: qty 20, 7d supply, fill #0

## 2024-06-24 ENCOUNTER — Other Ambulatory Visit: Payer: Self-pay

## 2024-06-26 DIAGNOSIS — I10 Essential (primary) hypertension: Secondary | ICD-10-CM | POA: Diagnosis not present

## 2024-06-26 DIAGNOSIS — R300 Dysuria: Secondary | ICD-10-CM | POA: Diagnosis not present

## 2024-06-26 DIAGNOSIS — E6609 Other obesity due to excess calories: Secondary | ICD-10-CM | POA: Diagnosis not present

## 2024-06-26 DIAGNOSIS — Z6832 Body mass index (BMI) 32.0-32.9, adult: Secondary | ICD-10-CM | POA: Diagnosis not present

## 2024-06-26 DIAGNOSIS — R030 Elevated blood-pressure reading, without diagnosis of hypertension: Secondary | ICD-10-CM | POA: Diagnosis not present

## 2024-07-22 ENCOUNTER — Encounter: Payer: Self-pay | Admitting: Family Medicine

## 2024-07-22 ENCOUNTER — Other Ambulatory Visit: Payer: Self-pay | Admitting: Family Medicine

## 2024-07-22 MED ORDER — CYCLOBENZAPRINE HCL 10 MG PO TABS
10.0000 mg | ORAL_TABLET | Freq: Three times a day (TID) | ORAL | 1 refills | Status: AC | PRN
  Filled 2024-07-22: qty 30, 10d supply, fill #0

## 2024-07-22 MED ORDER — ONDANSETRON HCL 4 MG PO TABS
4.0000 mg | ORAL_TABLET | Freq: Three times a day (TID) | ORAL | 0 refills | Status: DC | PRN
  Filled 2024-07-22: qty 20, 7d supply, fill #0

## 2024-07-23 ENCOUNTER — Other Ambulatory Visit: Payer: Self-pay

## 2024-07-24 ENCOUNTER — Other Ambulatory Visit: Payer: Self-pay

## 2024-07-25 ENCOUNTER — Other Ambulatory Visit: Payer: Self-pay

## 2024-07-25 ENCOUNTER — Telehealth (HOSPITAL_BASED_OUTPATIENT_CLINIC_OR_DEPARTMENT_OTHER): Admitting: Family Medicine

## 2024-07-25 ENCOUNTER — Encounter: Payer: Self-pay | Admitting: Family Medicine

## 2024-07-25 DIAGNOSIS — I10 Essential (primary) hypertension: Secondary | ICD-10-CM | POA: Diagnosis not present

## 2024-07-25 MED ORDER — SPIRONOLACTONE 25 MG PO TABS
12.5000 mg | ORAL_TABLET | Freq: Every day | ORAL | 1 refills | Status: DC
  Filled 2024-07-25: qty 45, 90d supply, fill #0

## 2024-07-25 NOTE — Progress Notes (Signed)
 Virtual Visit via Video Note  I connected with Katelyn Hardin, on 07/25/2024 at 8:12 AM by video enabled telemedicine device and verified that I am speaking with the correct person using two identifiers.   Consent: I discussed the limitations, risks, security and privacy concerns of performing an evaluation and management service by telemedicine and the availability of in person appointments. I also discussed with the patient that there may be a patient responsible charge related to this service. The patient expressed understanding and agreed to proceed.   Location of Patient: Home  Location of Provider: Clinic   Persons participating in Telemedicine visit: Katelyn Hardin Dr. Delbert    Discussed the use of AI scribe software for clinical note transcription with the patient, who gave verbal consent to proceed.  History of Present Illness Katelyn Hardin is a 53 year old female with  a history of hypertension, vertigo, sciatica, hyperparathyroidism status post parathyroidectomy  hypertension  who presents with concerns about elevated blood pressure readings.  She has been experiencing elevated blood pressure readings, initially noted during visits to [facility] for a weight loss program. Since August 22nd, she has been monitoring her blood pressure at home, with readings ranging from 145/93 to 126/92.  She is currently taking valsartan /hydrochlorothiazide  80/12.5 mg, having switched from Micardis /hydrochlorothiazide  due to insurance coverage issues. She recalls a poor experience with amlodipine  in the past, which made her feel 'really bad'. She has not taken spironolactone  before and does not recall any issues with lisinopril , although she cannot take it now.      Past Medical History:  Diagnosis Date   Anemia    Bacterial vaginosis    Complication of anesthesia    Slow to wake up   Family history of breast cancer    Fibroids     History of hiatal hernia    History of kidney stones    Hyperparathyroidism (HCC)    Hypertension    IBS 03/10/2008   Qualifier: Diagnosis of  By: Gladis FNP, [place]     IBS (irritable bowel syndrome)    Menopausal hot flushes 07/07/2014   Allergies  Allergen Reactions   Acetaminophen  Other (See Comments)    Jittery and upset stomach   Aspirin Nausea And Vomiting   Codeine     Jittery and upset stomach   Oxycodone -Acetaminophen      Jittery and upset stomach   Vicodin [Hydrocodone -Acetaminophen ]     Jittery and upset stomach    Current Outpatient Medications on File Prior to Visit  Medication Sig Dispense Refill   atorvastatin  (LIPITOR) 20 MG tablet Take 1 tablet (20 mg total) by mouth daily. 90 tablet 1   azelastine  (ASTELIN ) 0.1 % nasal spray Insert 2 sprays into both nostrils 2 (two) times daily 30 mL 12   benzonatate  (TESSALON ) 100 MG capsule Take 1 capsule (100 mg total) by mouth 2 (two) times daily as needed for cough. (Patient not taking: Reported on 05/09/2024) 20 capsule 0   carbamide peroxide (DEBROX) 6.5 % OTIC solution Place 5 drops into both ears 2 (two) times daily. 15 mL 0   cyclobenzaprine  (FLEXERIL ) 10 MG tablet Take 1 tablet (10 mg total) by mouth 3 (three) times daily as needed for muscle spasms. 30 tablet 1   estradiol  (VIVELLE -DOT) 0.0375 MG/24HR Place 1 patch onto the skin 2 (two) times a week. 24 patch 3   fexofenadine  (ALLEGRA ) 180 MG tablet Take 1 tablet (180 mg total) by mouth daily. 30 tablet 0  fluticasone  (FLONASE ) 50 MCG/ACT nasal spray Place 1 spray into both nostrils daily. 16 g 2   hydrOXYzine  (ATARAX ) 25 MG tablet Take 1 tablet (25 mg total) by mouth at bedtime as needed. 30 tablet 1   levocetirizine (XYZAL  ALLERGY 24HR) 5 MG tablet Take 1 tablet (5 mg total) by mouth every evening. 90 tablet 1   meclizine  (ANTIVERT ) 25 MG tablet Take 1 tablet (25 mg total) by mouth 3 (three) times daily as needed. 60 tablet 0   Multiple Vitamin (MULTIVITAMIN WITH  MINERALS) TABS tablet Take 1 tablet by mouth daily.     ondansetron  (ZOFRAN ) 4 MG tablet Take 1 tablet (4 mg total) by mouth every 8 (eight) hours as needed for nausea or vomiting. 20 tablet 0   Probiotic Product (PROBIOTIC PO) Take 1 capsule by mouth daily.     progesterone  (PROMETRIUM ) 100 MG capsule Take 1 capsule (100 mg total) by mouth daily. 90 capsule 3   sodium chloride  (ALTAMIST SPRAY) 0.65 % nasal spray Place 1 spray into the nose every 2 (two) hours as needed for congestion 60 mL 0   valsartan -hydrochlorothiazide  (DIOVAN -HCT) 80-12.5 MG tablet Take 1 tablet by mouth daily. 90 tablet 1   No current facility-administered medications on file prior to visit.    ROS: See HPI  Observations/Objective: Awake, alert, oriented x3 Not in acute distress Normal mood      Latest Ref Rng & Units 05/09/2024   11:34 AM 10/09/2023   10:57 AM 08/23/2023    9:21 AM  CMP  Glucose 70 - 99 mg/dL 94   898   BUN 6 - 24 mg/dL 12   10   Creatinine 9.42 - 1.00 mg/dL 9.01   9.09   Sodium 865 - 144 mmol/L 139   141   Potassium 3.5 - 5.2 mmol/L 3.6   3.9   Chloride 96 - 106 mmol/L 101   104   CO2 20 - 29 mmol/L 22   25   Calcium  8.7 - 10.2 mg/dL 9.2   8.9   Total Protein 6.0 - 8.5 g/dL 7.1  8.1  7.1   Total Bilirubin 0.0 - 1.2 mg/dL 0.4  0.3  0.2   Alkaline Phos 44 - 121 IU/L 81  107  88   AST 0 - 40 IU/L 18  26  34   ALT 0 - 32 IU/L 17  29  35     Lipid Panel     Component Value Date/Time   CHOL 196 05/09/2024 1134   TRIG 102 05/09/2024 1134   HDL 38 (L) 05/09/2024 1134   CHOLHDL 4.4 10/23/2019 1110   CHOLHDL 3.6 04/04/2013 1022   VLDL 20 04/04/2013 1022   LDLCALC 139 (H) 05/09/2024 1134   LABVLDL 19 05/09/2024 1134    Lab Results  Component Value Date   HGBA1C 5.3 05/09/2024     Assessment and plan:  Assessment & Plan Essential hypertension Blood pressure elevated with current valsartan -hydrochlorothiazide  80/12.5 mg. Intolerance to amlodipine  and lisinopril  noted.  Discussed options: increase current dose or add spironolactone . Adding spironolactone  preferred to minimize hypotension risk. - Continue valsartan -hydrochlorothiazide  80/12.5 mg daily. - Add spironolactone  12.5 mg daily. - Monitor blood pressure regularly. - Adjust treatment based on tolerance and blood pressure.   There are no diagnoses linked to this encounter.   No orders of the defined types were placed in this encounter.   Follow Up Instructions: Keep previously scheduled appointment    I discussed the assessment and treatment  plan with the patient. The patient was provided an opportunity to ask questions and all were answered. The patient agreed with the plan and demonstrated an understanding of the instructions.   The patient was advised to call back or seek an in-person evaluation if the symptoms worsen or if the condition fails to improve as anticipated.     I provided 13 minutes total of Telehealth time during this encounter including median intraservice time, reviewing previous notes, investigations, ordering medications, medical decision making, coordinating care and patient verbalized understanding at the end of the visit.     Corrina Sabin, MD, FAAFP. [facility] [place], [place] [phone]   07/25/2024, 8:12 AM

## 2024-07-26 ENCOUNTER — Other Ambulatory Visit: Payer: Self-pay

## 2024-07-26 DIAGNOSIS — I10 Essential (primary) hypertension: Secondary | ICD-10-CM | POA: Diagnosis not present

## 2024-07-26 DIAGNOSIS — E6609 Other obesity due to excess calories: Secondary | ICD-10-CM | POA: Diagnosis not present

## 2024-07-26 DIAGNOSIS — Z6831 Body mass index (BMI) 31.0-31.9, adult: Secondary | ICD-10-CM | POA: Diagnosis not present

## 2024-07-26 DIAGNOSIS — L659 Nonscarring hair loss, unspecified: Secondary | ICD-10-CM | POA: Diagnosis not present

## 2024-07-26 DIAGNOSIS — R030 Elevated blood-pressure reading, without diagnosis of hypertension: Secondary | ICD-10-CM | POA: Diagnosis not present

## 2024-07-31 ENCOUNTER — Other Ambulatory Visit: Payer: Self-pay

## 2024-07-31 DIAGNOSIS — N951 Menopausal and female climacteric states: Secondary | ICD-10-CM | POA: Diagnosis not present

## 2024-07-31 DIAGNOSIS — N898 Other specified noninflammatory disorders of vagina: Secondary | ICD-10-CM | POA: Diagnosis not present

## 2024-07-31 MED ORDER — ESTRADIOL 0.05 MG/24HR TD PTTW
1.0000 | MEDICATED_PATCH | TRANSDERMAL | 3 refills | Status: AC
  Filled 2024-07-31: qty 24, 30d supply, fill #0
  Filled 2024-08-01: qty 24, 84d supply, fill #0
  Filled 2024-09-03 – 2024-10-19 (×2): qty 24, 84d supply, fill #1

## 2024-08-01 ENCOUNTER — Encounter: Payer: Self-pay | Admitting: Family Medicine

## 2024-08-01 ENCOUNTER — Other Ambulatory Visit: Payer: Self-pay

## 2024-08-02 ENCOUNTER — Other Ambulatory Visit: Payer: Self-pay | Admitting: Family Medicine

## 2024-08-02 DIAGNOSIS — H938X3 Other specified disorders of ear, bilateral: Secondary | ICD-10-CM

## 2024-08-05 ENCOUNTER — Other Ambulatory Visit: Payer: Self-pay | Admitting: Family Medicine

## 2024-08-05 ENCOUNTER — Other Ambulatory Visit: Payer: Self-pay

## 2024-08-05 MED ORDER — CEPHALEXIN 500 MG PO CAPS
500.0000 mg | ORAL_CAPSULE | Freq: Two times a day (BID) | ORAL | 0 refills | Status: DC
  Filled 2024-08-05: qty 14, 7d supply, fill #0

## 2024-08-06 ENCOUNTER — Other Ambulatory Visit: Payer: Self-pay

## 2024-08-09 ENCOUNTER — Telehealth: Admitting: Family Medicine

## 2024-08-09 DIAGNOSIS — I959 Hypotension, unspecified: Secondary | ICD-10-CM

## 2024-08-09 NOTE — Patient Instructions (Signed)

## 2024-08-09 NOTE — Progress Notes (Signed)
 Virtual Visit Consent   Katelyn Hardin, you are scheduled for a virtual visit with a [facility] provider today. Just as with appointments in the office, your consent must be obtained to participate. Your consent will be active for this visit and any virtual visit you may have with one of our providers in the next 365 days. If you have a MyChart account, a copy of this consent can be sent to you electronically.  As this is a virtual visit, video technology does not allow for your provider to perform a traditional examination. This may limit your provider's ability to fully assess your condition. If your provider identifies any concerns that need to be evaluated in person or the need to arrange testing (such as labs, EKG, etc.), we will make arrangements to do so. Although advances in technology are sophisticated, we cannot ensure that it will always work on either your end or our end. If the connection with a video visit is poor, the visit may have to be switched to a telephone visit. With either a video or telephone visit, we are not always able to ensure that we have a secure connection.  By engaging in this virtual visit, you consent to the provision of healthcare and authorize for your insurance to be billed (if applicable) for the services provided during this visit. Depending on your insurance coverage, you may receive a charge related to this service.  I need to obtain your verbal consent now. Are you willing to proceed with your visit today? Katelyn Hardin has provided verbal consent on 08/09/2024 for a virtual visit (video or telephone). Loa Lamp, FNP  Date: 08/09/2024 5:21 PM   Virtual Visit via Video Note   I, Loa Lamp, connected with  Katelyn Hardin  ([id number], 19-Sep-1971) on 08/09/24 at  5:15 PM EDT by a video-enabled telemedicine application and verified that I am speaking with the correct person using two  identifiers.  Location: Patient: Virtual Visit Location Patient: Home Provider: Virtual Visit Location Provider: Home Office   I discussed the limitations of evaluation and management by telemedicine and the availability of in person appointments. The patient expressed understanding and agreed to proceed.    History of Present Illness: Katelyn Hardin is a 53 y.o. who identifies as a female who was assigned female at birth, and is being seen today for low BP with weakness, dizziness and low BP this week. Her pcp just added spironolactone  12.5 mg daily. BP @100 /60.   HPI: HPI  Problems:  Patient Active Problem List   Diagnosis Date Noted   Vaginal odor 11/03/2022   Acute MEE (middle ear effusion), bilateral 11/03/2022   Acute recurrent sinusitis 11/03/2022   Colon cancer screening 12/07/2021   Genetic testing 12/03/2019   Family history of breast cancer    Primary hyperparathyroidism (HCC) 05/09/2019   Multiple thyroid  nodules 04/16/2019   Acute bilateral low back pain with sciatica 03/01/2017   OSA (obstructive sleep apnea) 01/04/2017   Vaginal dryness, menopausal 08/11/2016   Healthcare maintenance 08/11/2016   Mild obesity 06/23/2016   Screening for breast cancer 06/23/2016   Cerumen in auditory canal on examination 12/30/2015   Menopause syndrome 09/02/2013   Insomnia 09/02/2013   Fibroid 09/02/2013   Dysmenorrhea 08/17/2012   DEPRESSION 07/21/2010   HYPERTENSION, BENIGN ESSENTIAL 07/21/2010   Vitamin D  deficiency 01/05/2010   Overweight 04/29/2008   ANEMIA 04/29/2008   Fatigue 04/29/2008   PSORIASIS 04/04/2008   Other specified disorders of bladder 03/10/2008  Allergies:  Allergies  Allergen Reactions   Acetaminophen  Other (See Comments)    Jittery and upset stomach   Aspirin Nausea And Vomiting   Codeine     Jittery and upset stomach   Oxycodone -Acetaminophen      Jittery and upset stomach   Vicodin [Hydrocodone -Acetaminophen ]     Jittery and  upset stomach   Medications:  Current Outpatient Medications:    atorvastatin  (LIPITOR) 20 MG tablet, Take 1 tablet (20 mg total) by mouth daily., Disp: 90 tablet, Rfl: 1   azelastine  (ASTELIN ) 0.1 % nasal spray, Insert 2 sprays into both nostrils 2 (two) times daily, Disp: 30 mL, Rfl: 12   benzonatate  (TESSALON ) 100 MG capsule, Take 1 capsule (100 mg total) by mouth 2 (two) times daily as needed for cough. (Patient not taking: Reported on 05/09/2024), Disp: 20 capsule, Rfl: 0   carbamide peroxide (DEBROX) 6.5 % OTIC solution, Place 5 drops into both ears 2 (two) times daily., Disp: 15 mL, Rfl: 0   cephALEXin  (KEFLEX ) 500 MG capsule, Take 1 capsule (500 mg total) by mouth 2 (two) times daily., Disp: 14 capsule, Rfl: 0   cyclobenzaprine  (FLEXERIL ) 10 MG tablet, Take 1 tablet (10 mg total) by mouth 3 (three) times daily as needed for muscle spasms., Disp: 30 tablet, Rfl: 1   estradiol  (VIVELLE -DOT) 0.05 MG/24HR patch, Place 1 patch (0.05 mg total) onto the skin 2 (two) times a week., Disp: 24 patch, Rfl: 3   fexofenadine  (ALLEGRA ) 180 MG tablet, Take 1 tablet (180 mg total) by mouth daily., Disp: 30 tablet, Rfl: 0   fluticasone  (FLONASE ) 50 MCG/ACT nasal spray, Place 1 spray into both nostrils daily., Disp: 16 g, Rfl: 2   hydrOXYzine  (ATARAX ) 25 MG tablet, Take 1 tablet (25 mg total) by mouth at bedtime as needed., Disp: 30 tablet, Rfl: 1   levocetirizine (XYZAL  ALLERGY 24HR) 5 MG tablet, Take 1 tablet (5 mg total) by mouth every evening., Disp: 90 tablet, Rfl: 1   meclizine  (ANTIVERT ) 25 MG tablet, Take 1 tablet (25 mg total) by mouth 3 (three) times daily as needed., Disp: 60 tablet, Rfl: 0   Multiple Vitamin (MULTIVITAMIN WITH MINERALS) TABS tablet, Take 1 tablet by mouth daily., Disp: , Rfl:    ondansetron  (ZOFRAN ) 4 MG tablet, Take 1 tablet (4 mg total) by mouth every 8 (eight) hours as needed for nausea or vomiting., Disp: 20 tablet, Rfl: 0   Probiotic Product (PROBIOTIC PO), Take 1 capsule by  mouth daily., Disp: , Rfl:    progesterone  (PROMETRIUM ) 100 MG capsule, Take 1 capsule (100 mg total) by mouth daily., Disp: 90 capsule, Rfl: 3   sodium chloride  (ALTAMIST SPRAY) 0.65 % nasal spray, Place 1 spray into the nose every 2 (two) hours as needed for congestion, Disp: 60 mL, Rfl: 0   spironolactone  (ALDACTONE ) 25 MG tablet, Take 0.5 tablets (12.5 mg total) by mouth daily., Disp: 45 tablet, Rfl: 1   valsartan -hydrochlorothiazide  (DIOVAN -HCT) 80-12.5 MG tablet, Take 1 tablet by mouth daily., Disp: 90 tablet, Rfl: 1  Observations/Objective: Patient is well-developed, well-nourished in no acute distress.  Resting comfortably  at home.  Head is normocephalic, atraumatic.  No labored breathing.  Speech is clear and coherent with logical content.  Patient is alert and oriented at baseline.    Assessment and Plan: There are no diagnoses linked to this encounter. In discussing this she noticed her bottle says to take half of a 25 mg tab and she she has been taking the whole tab.  She will decrease to half tab a day and monitor BP. She will follow up with pcp.   Follow Up Instructions: I discussed the assessment and treatment plan with the patient. The patient was provided an opportunity to ask questions and all were answered. The patient agreed with the plan and demonstrated an understanding of the instructions.  A copy of instructions were sent to the patient via MyChart unless otherwise noted below.     The patient was advised to call back or seek an in-person evaluation if the symptoms worsen or if the condition fails to improve as anticipated.    K[place]ak Rollyson, FNP

## 2024-08-11 ENCOUNTER — Encounter: Payer: Self-pay | Admitting: Family Medicine

## 2024-08-12 ENCOUNTER — Encounter: Payer: Self-pay | Admitting: Family Medicine

## 2024-08-14 ENCOUNTER — Encounter: Payer: Self-pay | Admitting: Family Medicine

## 2024-08-14 ENCOUNTER — Telehealth (HOSPITAL_BASED_OUTPATIENT_CLINIC_OR_DEPARTMENT_OTHER): Admitting: Family Medicine

## 2024-08-14 DIAGNOSIS — I10 Essential (primary) hypertension: Secondary | ICD-10-CM | POA: Diagnosis not present

## 2024-08-14 NOTE — Progress Notes (Signed)
 Virtual Visit via Video Note  I connected with Katelyn Hardin, on 08/14/2024 at 12:35 PM by video enabled telemedicine device and verified that I am speaking with the correct person using two identifiers.   Consent: I discussed the limitations, risks, security and privacy concerns of performing an evaluation and management service by telemedicine and the availability of in person appointments. I also discussed with the patient that there may be a patient responsible charge related to this service. The patient expressed understanding and agreed to proceed.   Location of Patient: Home  Location of Provider: Clinic   Persons participating in Telemedicine visit: Katelyn Hardin Dr. Delbert    Discussed the use of AI scribe software for clinical note transcription with the patient, who gave verbal consent to proceed.  History of Present Illness Katelyn Hardin is a 53 year old female with  with  a history of hypertension, vertigo, sciatica, hyperparathyroidism status post parathyroidectomy  hypertension who presents with concerns about blood pressure management and medication side effects.   At her last visit she had Complained of elevated BP of 145/93 at her weight management visit and so Spironolactone  12.5mg  had been added t oValsartan 80/12.5mg . She monitors her blood pressure regularly, with generally good readings except for a low of 95/75 on September 15. She takes her blood pressure in the morning before medication. She mistakenly took 25 mg of spironolactone  instead of 12.5 mg prescribed at her last visit resulting in severe fatigue and a low reading of 102/66 on September 19. A virtual visit clarified the dosing error. After consuming olives and water, her blood pressure improved to 129/88, and she felt better. She has not taken any blood pressure medication, including valsartan /hydrochlorothiazide , for the past three days, with stable  readings, the most recent being 116/79.        Past Medical History:  Diagnosis Date   Anemia    Bacterial vaginosis    Complication of anesthesia    Slow to wake up   Family history of breast cancer    Fibroids    History of hiatal hernia    History of kidney stones    Hyperparathyroidism    Hypertension    IBS 03/10/2008   Qualifier: Diagnosis of  By: Gladis FNP, Nykedtra     IBS (irritable bowel syndrome)    Menopausal hot flushes 07/07/2014   Allergies  Allergen Reactions   Acetaminophen  Other (See Comments)    Jittery and upset stomach   Aspirin Nausea And Vomiting   Codeine     Jittery and upset stomach   Oxycodone -Acetaminophen      Jittery and upset stomach   Vicodin [Hydrocodone -Acetaminophen ]     Jittery and upset stomach    Current Outpatient Medications on File Prior to Visit  Medication Sig Dispense Refill   atorvastatin  (LIPITOR) 20 MG tablet Take 1 tablet (20 mg total) by mouth daily. 90 tablet 1   azelastine  (ASTELIN ) 0.1 % nasal spray Insert 2 sprays into both nostrils 2 (two) times daily 30 mL 12   benzonatate  (TESSALON ) 100 MG capsule Take 1 capsule (100 mg total) by mouth 2 (two) times daily as needed for cough. (Patient not taking: Reported on 05/09/2024) 20 capsule 0   carbamide peroxide (DEBROX) 6.5 % OTIC solution Place 5 drops into both ears 2 (two) times daily. 15 mL 0   cephALEXin  (KEFLEX ) 500 MG capsule Take 1 capsule (500 mg total) by mouth 2 (two) times daily. 14 capsule 0  cyclobenzaprine  (FLEXERIL ) 10 MG tablet Take 1 tablet (10 mg total) by mouth 3 (three) times daily as needed for muscle spasms. 30 tablet 1   estradiol  (VIVELLE -DOT) 0.05 MG/24HR patch Place 1 patch (0.05 mg total) onto the skin 2 (two) times a week. 24 patch 3   fexofenadine  (ALLEGRA ) 180 MG tablet Take 1 tablet (180 mg total) by mouth daily. 30 tablet 0   fluticasone  (FLONASE ) 50 MCG/ACT nasal spray Place 1 spray into both nostrils daily. 16 g 2   hydrOXYzine  (ATARAX ) 25  MG tablet Take 1 tablet (25 mg total) by mouth at bedtime as needed. 30 tablet 1   levocetirizine (XYZAL  ALLERGY 24HR) 5 MG tablet Take 1 tablet (5 mg total) by mouth every evening. 90 tablet 1   meclizine  (ANTIVERT ) 25 MG tablet Take 1 tablet (25 mg total) by mouth 3 (three) times daily as needed. 60 tablet 0   Multiple Vitamin (MULTIVITAMIN WITH MINERALS) TABS tablet Take 1 tablet by mouth daily.     ondansetron  (ZOFRAN ) 4 MG tablet Take 1 tablet (4 mg total) by mouth every 8 (eight) hours as needed for nausea or vomiting. 20 tablet 0   Probiotic Product (PROBIOTIC PO) Take 1 capsule by mouth daily.     progesterone  (PROMETRIUM ) 100 MG capsule Take 1 capsule (100 mg total) by mouth daily. 90 capsule 3   sodium chloride  (ALTAMIST SPRAY) 0.65 % nasal spray Place 1 spray into the nose every 2 (two) hours as needed for congestion 60 mL 0   spironolactone  (ALDACTONE ) 25 MG tablet Take 0.5 tablets (12.5 mg total) by mouth daily. 45 tablet 1   valsartan -hydrochlorothiazide  (DIOVAN -HCT) 80-12.5 MG tablet Take 1 tablet by mouth daily. 90 tablet 1   No current facility-administered medications on file prior to visit.    ROS: See HPI  Observations/Objective: Awake, alert, oriented x3 Not in acute distress Normal mood      Latest Ref Rng & Units 05/09/2024   11:34 AM 10/09/2023   10:57 AM 08/23/2023    9:21 AM  CMP  Glucose 70 - 99 mg/dL 94   898   BUN 6 - 24 mg/dL 12   10   Creatinine 9.42 - 1.00 mg/dL 9.01   9.09   Sodium 865 - 144 mmol/L 139   141   Potassium 3.5 - 5.2 mmol/L 3.6   3.9   Chloride 96 - 106 mmol/L 101   104   CO2 20 - 29 mmol/L 22   25   Calcium  8.7 - 10.2 mg/dL 9.2   8.9   Total Protein 6.0 - 8.5 g/dL 7.1  8.1  7.1   Total Bilirubin 0.0 - 1.2 mg/dL 0.4  0.3  0.2   Alkaline Phos 44 - 121 IU/L 81  107  88   AST 0 - 40 IU/L 18  26  34   ALT 0 - 32 IU/L 17  29  35     Lipid Panel     Component Value Date/Time   CHOL 196 05/09/2024 1134   TRIG 102 05/09/2024 1134    HDL 38 (L) 05/09/2024 1134   CHOLHDL 4.4 10/23/2019 1110   CHOLHDL 3.6 04/04/2013 1022   VLDL 20 04/04/2013 1022   LDLCALC 139 (H) 05/09/2024 1134   LABVLDL 19 05/09/2024 1134    Lab Results  Component Value Date   HGBA1C 5.3 05/09/2024     Assessment and plan:  Assessment & Plan Essential hypertension Blood pressure stable without medication, but previous  medications may still influence readings. - Monitor blood pressure daily, especially in the morning before medications. - Restart valsartan -hydrochlorothiazide  if blood pressure exceeds 140/90 mmHg. - Add spironolactone  12.5 mg if blood pressure remains above 140/90 mmHg on valsartan -hydrochlorothiazide . - Avoid spironolactone  unless necessary due to previous adverse effects. - Reassess management if consistent readings above 140/90 mmHg occur.       No orders of the defined types were placed in this encounter.   Follow Up Instructions: Return for previously scheduled appointment.    I discussed the assessment and treatment plan with the patient. The patient was provided an opportunity to ask questions and all were answered. The patient agreed with the plan and demonstrated an understanding of the instructions.   The patient was advised to call back or seek an in-person evaluation if the symptoms worsen or if the condition fails to improve as anticipated.     I provided 17 minutes total of Telehealth time during this encounter including median intraservice time, reviewing previous notes, investigations, ordering medications, medical decision making, coordinating care and patient verbalized understanding at the end of the visit.     Corrina Sabin, MD, FAAFP. [facility] [place], [place] [phone]   08/14/2024, 12:35 PM

## 2024-08-15 ENCOUNTER — Ambulatory Visit (INDEPENDENT_AMBULATORY_CARE_PROVIDER_SITE_OTHER): Admitting: Physician Assistant

## 2024-08-15 ENCOUNTER — Ambulatory Visit (INDEPENDENT_AMBULATORY_CARE_PROVIDER_SITE_OTHER): Admitting: Audiology

## 2024-08-15 VITALS — BP 137/82 | HR 60 | Temp 98.7°F | Ht 66.0 in | Wt 196.0 lb

## 2024-08-15 DIAGNOSIS — H938X3 Other specified disorders of ear, bilateral: Secondary | ICD-10-CM | POA: Diagnosis not present

## 2024-08-15 DIAGNOSIS — H903 Sensorineural hearing loss, bilateral: Secondary | ICD-10-CM | POA: Diagnosis not present

## 2024-08-15 DIAGNOSIS — L299 Pruritus, unspecified: Secondary | ICD-10-CM

## 2024-08-15 NOTE — Progress Notes (Signed)
  [address] [place], [place] [address] [phone]  Audiological Evaluation    Name: Katelyn Hardin     DOB:   1971-03-01      MRN:   [id number]                                                                                     Service Date: 08/15/2024     Accompanied by: unaccompanied   Patient comes today after Reyes Cohen, PA-C sent a referral for a hearing evaluation due to concerns with ear fullness.   Symptoms Yes Details  Hearing loss  []    Tinnitus  [x]  Sometimes - cannot remember if one or both ears  Ear pain/ infections/pressure  [x]  Feels bilateral ear pressure for about 6+ months. Says that also her head feels clogged. Reports a general sensation of feeling under water. 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 Left ear:  Clear external ear canal and notable landmarks visualized on the tympanic membrane.  Tympanometry: Right ear: Type A- Normal external ear canal volume with normal middle ear pressure and tympanic membrane compliance. Left ear: Type A- Normal external ear canal volume with normal middle ear pressure and tympanic membrane compliance.  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 Left ear-  Normal hearing from 125-6000Hz , then presumably sensorineural hearing loss at 8000 Hz.  Speech Audiometry: Right ear- Speech Reception Threshold (SRT) was obtained at 5 dBHL. Left ear-Speech Reception Threshold (SRT) was obtained at 5 dBHL.   Word Recognition Score Tested using NU-6 (recorded) Right ear:  100% was obtained at a presentation level of 50 dBHL with contralateral masking which is deemed as  excellent. Left ear: 100% was obtained at a presentation level of 50 dBHL with contralateral masking which is deemed as  excellent.   The hearing test results were completed under headphones and results are deemed to be of good reliability. Test technique:  conventional    Impression: There is not a significant difference in pure-tone thresholds between ears. There is not a significant difference in the word recognition score in between ears.    Recommendations: Follow up with ENT as scheduled for today. Return for a hearing evaluation if concerns with hearing changes arise or per MD recommendation.   Margarita Croke MARIE LEROUX-MARTINEZ, AUD

## 2024-08-15 NOTE — Progress Notes (Signed)
 Dear Dr. Newlin, Here is my assessment for our mutual patient, Katelyn Hardin. Thank you for allowing me the opportunity to care for your patient. Please do not hesitate to contact me should you have any other questions. Sincerely, Chyrl Cohen PA-C  Otolaryngology Clinic Note Referring provider: Dr. Delbert HPI:  Katelyn Hardin is a 53 y.o. female kindly referred by Dr. Delbert   The patient is a 53 year old female seen in our office for evaluation of fullness in her ears.  The patient notes her last year she has felt fullness in her ears as well as ear itching.  She notes that she feels like there is water in her ears.  She went to urgent care who noted no significant abnormalities.  She notes no history of ear surgery but did have repeat episodes of otitis media as a child.  She does note seasonal allergies but they are very well-controlled.  No clicking or popping in the ear, no dizziness.  She notes intermittent bilateral tonal tinnitus.,  No TMJ.  She does use Bobby pins to scratch her ear.  Surgical history of parathyroidectomy 2019    Independent Review of Additional Tests or Records:   Audiological evaluation 08/15/2024 Otoscopy: Right ear: Clear external ear canal and notable landmarks visualized on the tympanic membrane. Left ear:  Clear external ear canal and notable landmarks visualized on the tympanic membrane.   Tympanometry: Right ear: Type A- Normal external ear canal volume with normal middle ear pressure and tympanic membrane compliance. Left ear: Type A- Normal external ear canal volume with normal middle ear pressure and tympanic membrane compliance.   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 Left ear-  Normal hearing from 125-6000Hz , then presumably sensorineural hearing loss at 8000 Hz.   Speech Audiometry: Right ear- Speech Reception Threshold (SRT) was obtained at 5  dBHL. Left ear-Speech Reception Threshold (SRT) was obtained at 5 dBHL.   Word Recognition Score Tested using NU-6 (recorded) Right ear: 100% was obtained at a presentation level of 50 dBHL with contralateral masking which is deemed as  excellent. Left ear: 100% was obtained at a presentation level of 50 dBHL with contralateral masking which is deemed as  excellent.   The hearing test results were completed under headphones and results are deemed to be of good reliability. Test technique:  conventional     Impression: There is not a significant difference in pure-tone thresholds between ears. There is not a significant difference in the word recognition score in between ears.    Recommendations: Follow up with ENT as scheduled for today. Return for a hearing evaluation if concerns with hearing changes arise or per MD recommendation.    PMH/Meds/All/SocHx/FamHx/ROS:   Past Medical History:  Diagnosis Date   Anemia    Bacterial vaginosis    Complication of anesthesia    Slow to wake up   Family history of breast cancer    Fibroids    History of hiatal hernia    History of kidney stones    Hyperparathyroidism    Hypertension    IBS 03/10/2008   Qualifier: Diagnosis of  By: Gladis FNP, Nykedtra     IBS (irritable bowel syndrome)    Menopausal hot flushes 07/07/2014     Past Surgical History:  Procedure Laterality Date   PARATHYROIDECTOMY N/A 05/09/2019   Procedure: PARATHYROIDECTOMY;  Surgeon: Eletha Boas, MD;  Location: [facility];  Service: General;  Laterality: N/A;  TUBAL LIGATION     TUBAL LIGATION      Family History  Problem Relation Age of Onset   Cervical cancer Mother    Thyroid  disease Sister    Breast cancer Sister 59   Cancer Maternal Grandfather        unknown   Breast cancer Other        dx in her 16s; MGM's sister   Breast cancer Other        5 of MGM sisters   Diabetes Neg Hx      Social Connections: Socially Integrated (10/09/2023)   Social  Connection and Isolation Panel    Frequency of Communication with Friends and Family: Three times a week    Frequency of Social Gatherings with Friends and Family: Once a week    Attends Religious Services: 1 to 4 times per year    Active Member of [employer] or Organizations: Yes    Attends Engineer, structural: More than 4 times per year    Marital Status: Married      Current Outpatient Medications:    atorvastatin  (LIPITOR) 20 MG tablet, Take 1 tablet (20 mg total) by mouth daily., Disp: 90 tablet, Rfl: 1   azelastine  (ASTELIN ) 0.1 % nasal spray, Insert 2 sprays into both nostrils 2 (two) times daily, Disp: 30 mL, Rfl: 12   carbamide peroxide (DEBROX) 6.5 % OTIC solution, Place 5 drops into both ears 2 (two) times daily., Disp: 15 mL, Rfl: 0   cephALEXin  (KEFLEX ) 500 MG capsule, Take 1 capsule (500 mg total) by mouth 2 (two) times daily., Disp: 14 capsule, Rfl: 0   cyclobenzaprine  (FLEXERIL ) 10 MG tablet, Take 1 tablet (10 mg total) by mouth 3 (three) times daily as needed for muscle spasms., Disp: 30 tablet, Rfl: 1   estradiol  (VIVELLE -DOT) 0.05 MG/24HR patch, Place 1 patch (0.05 mg total) onto the skin 2 (two) times a week., Disp: 24 patch, Rfl: 3   fexofenadine  (ALLEGRA ) 180 MG tablet, Take 1 tablet (180 mg total) by mouth daily., Disp: 30 tablet, Rfl: 0   fluticasone  (FLONASE ) 50 MCG/ACT nasal spray, Place 1 spray into both nostrils daily., Disp: 16 g, Rfl: 2   hydrOXYzine  (ATARAX ) 25 MG tablet, Take 1 tablet (25 mg total) by mouth at bedtime as needed., Disp: 30 tablet, Rfl: 1   levocetirizine (XYZAL  ALLERGY 24HR) 5 MG tablet, Take 1 tablet (5 mg total) by mouth every evening., Disp: 90 tablet, Rfl: 1   meclizine  (ANTIVERT ) 25 MG tablet, Take 1 tablet (25 mg total) by mouth 3 (three) times daily as needed., Disp: 60 tablet, Rfl: 0   Multiple Vitamin (MULTIVITAMIN WITH MINERALS) TABS tablet, Take 1 tablet by mouth daily., Disp: , Rfl:    ondansetron  (ZOFRAN ) 4 MG tablet, Take 1  tablet (4 mg total) by mouth every 8 (eight) hours as needed for nausea or vomiting., Disp: 20 tablet, Rfl: 0   Probiotic Product (PROBIOTIC PO), Take 1 capsule by mouth daily., Disp: , Rfl:    progesterone  (PROMETRIUM ) 100 MG capsule, Take 1 capsule (100 mg total) by mouth daily., Disp: 90 capsule, Rfl: 3   sodium chloride  (ALTAMIST SPRAY) 0.65 % nasal spray, Place 1 spray into the nose every 2 (two) hours as needed for congestion, Disp: 60 mL, Rfl: 0   spironolactone  (ALDACTONE ) 25 MG tablet, Take 0.5 tablets (12.5 mg total) by mouth daily., Disp: 45 tablet, Rfl: 1   valsartan -hydrochlorothiazide  (DIOVAN -HCT) 80-12.5 MG tablet, Take 1 tablet by mouth daily.,  Disp: 90 tablet, Rfl: 1   benzonatate  (TESSALON ) 100 MG capsule, Take 1 capsule (100 mg total) by mouth 2 (two) times daily as needed for cough. (Patient not taking: Reported on 08/15/2024), Disp: 20 capsule, Rfl: 0   Physical Exam:   BP 137/82 (BP Location: Right Arm, Patient Position: Sitting)   Pulse 60   Temp 98.7 F (37.1 C)   Ht 5' 6 (1.676 m)   Wt 196 lb (88.9 kg)   LMP 11/19/2016   SpO2 94%   BMI 31.64 kg/m   Pertinent Findings  CN II-XII intact Bilateral EAC clear and TM intact with well pneumatized middle ear spaces Anterior rhinoscopy: Septum midline; bilateral inferior turbinates with minimal hypertrophy No lesions of oral cavity/oropharynx; dentition within normal limits No obviously palpable neck masses/lymphadenopathy/thyromegaly No respiratory distress or stridor  Seprately Identifiable Procedures:  None  Impression & Plans:  Barbarita Hardin is a 53 y.o. female with the following   Ear fullness-  Bilateral ear fullness and itchy ears.  Uncertain etiology, normal exam today.  No other symptoms of eustachian tube dysfunction other than the sensation of fluid in the ears and ear fullness; reassuring audiogram here today.  She does note constant itchy ears, this could be the cause of her ongoing symptoms.   I do think it is reasonable to treat the pruritus with sweet oil to see if this helps improve her symptoms, she may benefit from Flonase  Claritin and saline irrigation.  If the above treatments are not helpful I will be happy to see her back in the office for repeat evaluation, sooner as needed.  She verbalized understanding and agreement to today's plan had no further questions or concerns.    - f/u PRN   Thank you for allowing me the opportunity to care for your patient. Please do not hesitate to contact me should you have any other questions.  Sincerely, Chyrl Cohen PA-C [facility] Phone: [phone] Fax: [phone]  08/15/2024, 9:38 AM

## 2024-08-15 NOTE — Patient Instructions (Signed)
 To help with your buildup of earwax try using sweet oil. This can be purchased over the counter at your local pharmacy. Using a sterilized ear dropper, place a few drops into your ear. Cover the ear with a cotton ball, or warm compress, for 5 to 10 minutes. Rub gently. Wipe out any excess ear wax, and the oil, with a cotton ball or a wet cloth.

## 2024-08-26 ENCOUNTER — Encounter: Payer: Self-pay | Admitting: Nurse Practitioner

## 2024-08-26 ENCOUNTER — Encounter: Payer: Self-pay | Admitting: Family Medicine

## 2024-08-26 ENCOUNTER — Other Ambulatory Visit: Payer: Self-pay

## 2024-08-26 ENCOUNTER — Ambulatory Visit: Payer: Self-pay

## 2024-08-26 ENCOUNTER — Ambulatory Visit (INDEPENDENT_AMBULATORY_CARE_PROVIDER_SITE_OTHER): Admitting: Nurse Practitioner

## 2024-08-26 VITALS — BP 105/71 | HR 66 | Resp 16 | Wt 191.2 lb

## 2024-08-26 DIAGNOSIS — I10 Essential (primary) hypertension: Secondary | ICD-10-CM | POA: Diagnosis not present

## 2024-08-26 MED ORDER — BLOOD PRESSURE KIT
1.0000 [IU] | PACK | Freq: Every day | 0 refills | Status: DC
  Filled 2024-08-26: qty 1, fill #0

## 2024-08-26 NOTE — Telephone Encounter (Signed)
 FYI Only or Action Required?: FYI only for provider.  Patient was last seen in primary care on 08/14/2024 by Newlin, Enobong, MD.  Called Nurse Triage reporting Hypotension.  Symptoms began several weeks ago.  Interventions attempted: Prescription medications: valsartan -hctz.  Symptoms are: hypotension (SBP 90-100s); fatigue/tired gradually worsening.  Triage Disposition: See HCP Within 4 Hours (Or PCP Triage)  Patient/caregiver understands and will follow disposition?: Yes             Copied from CRM [id number]. Topic: Clinical - Red Word Triage >> Aug 26, 2024 12:58 PM Ivette P wrote: Red Word that prompted transfer to Nurse Triage: 90/66 extremely tired. - primary is out and needs advice. Blood pressure Reason for Disposition  [1] Systolic BP 90-110 AND [2] taking blood pressure medications AND [3] feeling weak or lightheaded  Answer Assessment - Initial Assessment Questions 1. BLOOD PRESSURE: What is your blood pressure? Did you take at least two measurements 5 minutes apart?     104/73; 94/64.   2. ONSET: When did you take your blood pressure?     This morning 0740; and then recheck this afternoon about 15 minutes ago.  3. HOW: How did you take your blood pressure? (e.g., visiting nurse, automatic home BP monitor)     Automatic home BP monitor.  4. HISTORY: Do you have a history of low blood pressure? What is your blood pressure normally?     No.  5. MEDICINES: Are you taking any medicines for blood pressure? If Yes, ask: Have they been changed recently?     She was given aldactone  25mg  by the pharmacy and was not aware she was supposed to be taking the half dose (12.5mg ). She states she has stopped the Aldactone . She was told to hold her BP meds (valsartan -hydrochlorothiazide ) and restart if SBP reached 140 which she thought if she ever hit 140 to go back on her medications permanently so she is taking her medications even if SBP is not 140. She is  confused with her PCP's directions when she is supposed to hold medications or when she is supposed to take it.  6. PULSE RATE: Do you know what your pulse rate is?      She states she hasn't been paying attention to it.  7. OTHER SYMPTOMS: Have you been sick recently? Have you had a recent injury?     Tired/fatigue.  8. PREGNANCY: Is there any chance you are pregnant? When was your last menstrual period?     N/A.  Protocols used: Blood Pressure - Low-A-AH

## 2024-08-26 NOTE — Progress Notes (Signed)
 Subjective   Patient ID: Katelyn Hardin, female    DOB: 06/29/1971, 53 y.o.   MRN: [id number]  Chief Complaint  Patient presents with   Blood Pressure Check    Pt states she checked her bp around 12:45 and bp was 94/64. Feeling fatigue  Bp issues has been going on since september  Had virtual visit with pcp on 08/14/24    Referring provider: Delbert Clam, MD  Katelyn Hardin is a 53 y.o. female with Past Medical History: No date: Anemia No date: Bacterial vaginosis No date: Complication of anesthesia     Comment:  Slow to wake up No date: Family history of breast cancer No date: Fibroids No date: History of hiatal hernia No date: History of kidney stones No date: Hyperparathyroidism No date: Hypertension 03/10/2008: IBS     Comment:  Qualifier: Diagnosis of  By: Gladis FNP, Delorise   No date: IBS (irritable bowel syndrome) 07/07/2014: Menopausal hot flushes   HPI  Patient presents today for an acute visit.  This is a patient of Dr. Newlin.  She states that her blood pressure has been low recently.  We discussed her blood pressure medications and parameters. Patient has been taking a whole tablet of spironolactone  when she was prescribed half tablet.  We discussed that should hold BP meds if BP is low. She should keep a BP log for Dr. Newlin. Will also have pharmacy to follow for medication management. Denies f/c/s, n/v/d, hemoptysis, PND, leg swelling. Denies chest pain or edema.    Allergies  Allergen Reactions   Acetaminophen  Other (See Comments)    Jittery and upset stomach   Aspirin Nausea And Vomiting   Codeine     Jittery and upset stomach   Oxycodone -Acetaminophen      Jittery and upset stomach   Vicodin [Hydrocodone -Acetaminophen ]     Jittery and upset stomach    Immunization History  Administered Date(s) Administered   Influenza Split 08/31/2012   Influenza Whole 11/30/2009   Influenza,inj,Quad PF,6+ Mos 08/01/2014,  08/15/2014, 12/30/2015, 08/11/2016, 09/26/2017, 08/20/2018, 09/26/2019, 09/30/2020, 10/20/2021, 12/27/2022   PNEUMOCOCCAL CONJUGATE-20 12/27/2022   PPD Test 11/23/2012, 02/20/2014, 12/10/2014, 12/22/2014, 08/28/2019, 06/10/2024   Td 04/29/2008   Tdap 07/04/2019    Tobacco History: Social History   Tobacco Use  Smoking Status Never  Smokeless Tobacco Never   Counseling given: Not Answered   Outpatient Encounter Medications as of 08/26/2024  Medication Sig   Blood Pressure KIT 1 Units by Does not apply route daily.   atorvastatin  (LIPITOR) 20 MG tablet Take 1 tablet (20 mg total) by mouth daily.   azelastine  (ASTELIN ) 0.1 % nasal spray Insert 2 sprays into both nostrils 2 (two) times daily   benzonatate  (TESSALON ) 100 MG capsule Take 1 capsule (100 mg total) by mouth 2 (two) times daily as needed for cough. (Patient not taking: Reported on 08/15/2024)   carbamide peroxide (DEBROX) 6.5 % OTIC solution Place 5 drops into both ears 2 (two) times daily.   cephALEXin  (KEFLEX ) 500 MG capsule Take 1 capsule (500 mg total) by mouth 2 (two) times daily.   cyclobenzaprine  (FLEXERIL ) 10 MG tablet Take 1 tablet (10 mg total) by mouth 3 (three) times daily as needed for muscle spasms.   estradiol  (VIVELLE -DOT) 0.05 MG/24HR patch Place 1 patch (0.05 mg total) onto the skin 2 (two) times a week.   fexofenadine  (ALLEGRA ) 180 MG tablet Take 1 tablet (180 mg total) by mouth daily.   fluticasone  (FLONASE ) 50 MCG/ACT nasal spray Place  1 spray into both nostrils daily.   hydrOXYzine  (ATARAX ) 25 MG tablet Take 1 tablet (25 mg total) by mouth at bedtime as needed.   levocetirizine (XYZAL  ALLERGY 24HR) 5 MG tablet Take 1 tablet (5 mg total) by mouth every evening.   meclizine  (ANTIVERT ) 25 MG tablet Take 1 tablet (25 mg total) by mouth 3 (three) times daily as needed.   Multiple Vitamin (MULTIVITAMIN WITH MINERALS) TABS tablet Take 1 tablet by mouth daily.   ondansetron  (ZOFRAN ) 4 MG tablet Take 1 tablet (4 mg  total) by mouth every 8 (eight) hours as needed for nausea or vomiting.   Probiotic Product (PROBIOTIC PO) Take 1 capsule by mouth daily.   progesterone  (PROMETRIUM ) 100 MG capsule Take 1 capsule (100 mg total) by mouth daily.   sodium chloride  (ALTAMIST SPRAY) 0.65 % nasal spray Place 1 spray into the nose every 2 (two) hours as needed for congestion   spironolactone  (ALDACTONE ) 25 MG tablet Take 0.5 tablets (12.5 mg total) by mouth daily.   valsartan -hydrochlorothiazide  (DIOVAN -HCT) 80-12.5 MG tablet Take 1 tablet by mouth daily.   No facility-administered encounter medications on file as of 08/26/2024.    Review of Systems  Review of Systems  Constitutional: Negative.   HENT: Negative.    Cardiovascular: Negative.   Gastrointestinal: Negative.   Allergic/Immunologic: Negative.   Neurological: Negative.   Psychiatric/Behavioral: Negative.       Objective:   BP 105/71   Pulse 66   Resp 16   Wt 191 lb 3.2 oz (86.7 kg)   LMP 11/19/2016   SpO2 100%   BMI 30.86 kg/m   Wt Readings from Last 5 Encounters:  08/26/24 191 lb 3.2 oz (86.7 kg)  08/15/24 196 lb (88.9 kg)  05/09/24 208 lb 3.2 oz (94.4 kg)  11/09/23 209 lb (94.8 kg)  10/09/23 203 lb 12.8 oz (92.4 kg)     Physical Exam Vitals and nursing note reviewed.  Constitutional:      General: She is not in acute distress.    Appearance: She is well-developed.  Cardiovascular:     Rate and Rhythm: Normal rate and regular rhythm.  Pulmonary:     Effort: Pulmonary effort is normal.     Breath sounds: Normal breath sounds.  Neurological:     Mental Status: She is alert and oriented to person, place, and time.       Assessment & Plan:   Essential hypertension, benign -     AMB Referral [facility] Care Management -     Blood Pressure; 1 Units by Does not apply route daily.  Dispense: 1 kit; Refill: 0 -     CBC -     Comprehensive metabolic panel with GFR     Return in about 3 months (around 11/26/2024).   Bascom GORMAN Borer, NP 08/26/2024

## 2024-08-26 NOTE — Telephone Encounter (Signed)
 Noted

## 2024-08-27 LAB — CBC
Hematocrit: 35.9 % (ref 34.0–46.6)
Hemoglobin: 11.7 g/dL (ref 11.1–15.9)
MCH: 30.2 pg (ref 26.6–33.0)
MCHC: 32.6 g/dL (ref 31.5–35.7)
MCV: 93 fL (ref 79–97)
Platelets: 340 x10E3/uL (ref 150–450)
RBC: 3.87 x10E6/uL (ref 3.77–5.28)
RDW: 13.3 % (ref 11.7–15.4)
WBC: 5.6 x10E3/uL (ref 3.4–10.8)

## 2024-08-27 LAB — COMPREHENSIVE METABOLIC PANEL WITH GFR
ALT: 23 IU/L (ref 0–32)
AST: 23 IU/L (ref 0–40)
Albumin: 4.3 g/dL (ref 3.8–4.9)
Alkaline Phosphatase: 82 IU/L (ref 49–135)
BUN/Creatinine Ratio: 10 (ref 9–23)
BUN: 10 mg/dL (ref 6–24)
Bilirubin Total: 0.4 mg/dL (ref 0.0–1.2)
CO2: 27 mmol/L (ref 20–29)
Calcium: 9.5 mg/dL (ref 8.7–10.2)
Chloride: 100 mmol/L (ref 96–106)
Creatinine, Ser: 0.96 mg/dL (ref 0.57–1.00)
Globulin, Total: 3 g/dL (ref 1.5–4.5)
Glucose: 75 mg/dL (ref 70–99)
Potassium: 3.4 mmol/L — ABNORMAL LOW (ref 3.5–5.2)
Sodium: 141 mmol/L (ref 134–144)
Total Protein: 7.3 g/dL (ref 6.0–8.5)
eGFR: 70 mL/min/1.73 (ref >59)

## 2024-08-28 ENCOUNTER — Ambulatory Visit: Payer: Self-pay | Admitting: Nurse Practitioner

## 2024-08-29 ENCOUNTER — Telehealth: Payer: Self-pay | Admitting: Pharmacist

## 2024-08-29 ENCOUNTER — Other Ambulatory Visit: Payer: Self-pay

## 2024-08-29 NOTE — Telephone Encounter (Signed)
 Can we schedule this patient with me in 1 month? For BP check

## 2024-08-30 ENCOUNTER — Other Ambulatory Visit: Payer: Self-pay | Admitting: Pharmacist

## 2024-08-30 MED ORDER — BLOOD PRESSURE CUFF MISC: 0 refills | Status: AC

## 2024-09-03 ENCOUNTER — Other Ambulatory Visit: Payer: Self-pay | Admitting: Family Medicine

## 2024-09-03 ENCOUNTER — Other Ambulatory Visit: Payer: Self-pay

## 2024-09-04 ENCOUNTER — Other Ambulatory Visit: Payer: Self-pay | Admitting: Family Medicine

## 2024-09-04 ENCOUNTER — Other Ambulatory Visit: Payer: Self-pay

## 2024-09-04 DIAGNOSIS — J018 Other acute sinusitis: Secondary | ICD-10-CM

## 2024-09-04 MED ORDER — ONDANSETRON HCL 4 MG PO TABS
4.0000 mg | ORAL_TABLET | Freq: Three times a day (TID) | ORAL | 0 refills | Status: DC | PRN
  Filled 2024-09-04: qty 20, 7d supply, fill #0

## 2024-09-04 MED ORDER — FLUTICASONE PROPIONATE 50 MCG/ACT NA SUSP
1.0000 | Freq: Every day | NASAL | 2 refills | Status: AC
  Filled 2024-09-04: qty 16, 60d supply, fill #0

## 2024-09-05 ENCOUNTER — Other Ambulatory Visit: Payer: Self-pay

## 2024-09-11 ENCOUNTER — Other Ambulatory Visit: Payer: Self-pay

## 2024-09-11 ENCOUNTER — Encounter: Payer: Self-pay | Admitting: Family Medicine

## 2024-09-11 MED ORDER — FEXOFENADINE HCL 180 MG PO TABS
180.0000 mg | ORAL_TABLET | Freq: Every day | ORAL | 0 refills | Status: AC
  Filled 2024-09-11 – 2024-09-23 (×2): qty 30, 30d supply, fill #0

## 2024-09-12 ENCOUNTER — Other Ambulatory Visit: Payer: Self-pay

## 2024-09-20 ENCOUNTER — Other Ambulatory Visit: Payer: Self-pay

## 2024-09-23 ENCOUNTER — Other Ambulatory Visit: Payer: Self-pay

## 2024-09-27 ENCOUNTER — Encounter: Payer: Self-pay | Admitting: Family Medicine

## 2024-10-01 ENCOUNTER — Ambulatory Visit: Admitting: Pharmacist

## 2024-10-02 ENCOUNTER — Other Ambulatory Visit: Payer: Self-pay

## 2024-10-02 DIAGNOSIS — N951 Menopausal and female climacteric states: Secondary | ICD-10-CM | POA: Diagnosis not present

## 2024-10-02 DIAGNOSIS — D259 Leiomyoma of uterus, unspecified: Secondary | ICD-10-CM | POA: Diagnosis not present

## 2024-10-02 DIAGNOSIS — Z79890 Hormone replacement therapy (postmenopausal): Secondary | ICD-10-CM | POA: Diagnosis not present

## 2024-10-02 DIAGNOSIS — Z86018 Personal history of other benign neoplasm: Secondary | ICD-10-CM | POA: Diagnosis not present

## 2024-10-02 DIAGNOSIS — R6882 Decreased libido: Secondary | ICD-10-CM | POA: Diagnosis not present

## 2024-10-02 DIAGNOSIS — R9389 Abnormal findings on diagnostic imaging of other specified body structures: Secondary | ICD-10-CM | POA: Diagnosis not present

## 2024-10-02 MED ORDER — TESTOSTERONE 25 MG/2.5GM (1%) TD GEL: TRANSDERMAL | 3 refills | Status: DC

## 2024-10-03 ENCOUNTER — Other Ambulatory Visit: Payer: Self-pay

## 2024-10-03 ENCOUNTER — Other Ambulatory Visit: Payer: Self-pay | Admitting: Family Medicine

## 2024-10-03 DIAGNOSIS — Z1211 Encounter for screening for malignant neoplasm of colon: Secondary | ICD-10-CM

## 2024-10-03 NOTE — Telephone Encounter (Signed)
Patient requesting Colonoscopy.

## 2024-10-04 ENCOUNTER — Other Ambulatory Visit: Payer: Self-pay

## 2024-10-07 ENCOUNTER — Other Ambulatory Visit: Payer: Self-pay

## 2024-10-11 DIAGNOSIS — H5213 Myopia, bilateral: Secondary | ICD-10-CM | POA: Diagnosis not present

## 2024-10-19 ENCOUNTER — Other Ambulatory Visit: Payer: Self-pay | Admitting: Family Medicine

## 2024-10-19 ENCOUNTER — Other Ambulatory Visit (HOSPITAL_COMMUNITY): Payer: Self-pay

## 2024-10-20 MED ORDER — ONDANSETRON HCL 4 MG PO TABS
4.0000 mg | ORAL_TABLET | Freq: Three times a day (TID) | ORAL | 0 refills | Status: DC | PRN
  Filled 2024-10-20 – 2024-11-06 (×3): qty 20, 7d supply, fill #0

## 2024-10-21 ENCOUNTER — Other Ambulatory Visit: Payer: Self-pay | Admitting: Family Medicine

## 2024-10-21 ENCOUNTER — Other Ambulatory Visit (HOSPITAL_COMMUNITY): Payer: Self-pay

## 2024-10-21 ENCOUNTER — Other Ambulatory Visit: Payer: Self-pay

## 2024-10-22 ENCOUNTER — Other Ambulatory Visit (HOSPITAL_COMMUNITY): Payer: Self-pay

## 2024-10-22 ENCOUNTER — Ambulatory Visit

## 2024-10-22 ENCOUNTER — Other Ambulatory Visit: Payer: Self-pay

## 2024-10-22 ENCOUNTER — Ambulatory Visit: Admitting: Family Medicine

## 2024-11-06 ENCOUNTER — Other Ambulatory Visit (HOSPITAL_COMMUNITY): Payer: Self-pay

## 2024-11-06 ENCOUNTER — Other Ambulatory Visit: Payer: Self-pay

## 2024-11-07 ENCOUNTER — Other Ambulatory Visit: Payer: Self-pay

## 2024-11-08 ENCOUNTER — Ambulatory Visit (INDEPENDENT_AMBULATORY_CARE_PROVIDER_SITE_OTHER): Admitting: Physician Assistant

## 2024-11-11 ENCOUNTER — Ambulatory Visit: Payer: Self-pay | Attending: Family Medicine | Admitting: Family Medicine

## 2024-11-11 ENCOUNTER — Encounter: Payer: Self-pay | Admitting: Family Medicine

## 2024-11-11 ENCOUNTER — Other Ambulatory Visit: Payer: Self-pay

## 2024-11-11 VITALS — BP 113/77 | HR 95 | Temp 98.1°F | Ht 66.0 in | Wt 173.8 lb

## 2024-11-11 DIAGNOSIS — Z1159 Encounter for screening for other viral diseases: Secondary | ICD-10-CM

## 2024-11-11 DIAGNOSIS — H6991 Unspecified Eustachian tube disorder, right ear: Secondary | ICD-10-CM | POA: Diagnosis not present

## 2024-11-11 DIAGNOSIS — J328 Other chronic sinusitis: Secondary | ICD-10-CM | POA: Diagnosis not present

## 2024-11-11 DIAGNOSIS — E785 Hyperlipidemia, unspecified: Secondary | ICD-10-CM | POA: Diagnosis not present

## 2024-11-11 DIAGNOSIS — E876 Hypokalemia: Secondary | ICD-10-CM

## 2024-11-11 DIAGNOSIS — I10 Essential (primary) hypertension: Secondary | ICD-10-CM

## 2024-11-11 DIAGNOSIS — H8113 Benign paroxysmal vertigo, bilateral: Secondary | ICD-10-CM | POA: Diagnosis not present

## 2024-11-11 MED ORDER — LOSARTAN POTASSIUM 25 MG PO TABS
25.0000 mg | ORAL_TABLET | Freq: Every day | ORAL | 1 refills | Status: AC
  Filled 2024-11-11: qty 90, 90d supply, fill #0

## 2024-11-11 MED ORDER — ATORVASTATIN CALCIUM 20 MG PO TABS: 20.0000 mg | ORAL_TABLET | Freq: Every day | ORAL | 1 refills | Status: AC

## 2024-11-11 MED ORDER — MECLIZINE HCL 25 MG PO TABS
25.0000 mg | ORAL_TABLET | Freq: Three times a day (TID) | ORAL | 0 refills | Status: AC | PRN
  Filled 2024-11-11: qty 60, 20d supply, fill #0

## 2024-11-11 MED ORDER — PREDNISONE 20 MG PO TABS
20.0000 mg | ORAL_TABLET | Freq: Every day | ORAL | 0 refills | Status: AC
  Filled 2024-11-11: qty 5, 5d supply, fill #0

## 2024-11-11 MED ORDER — ONDANSETRON HCL 4 MG PO TABS
4.0000 mg | ORAL_TABLET | Freq: Three times a day (TID) | ORAL | 1 refills | Status: AC | PRN
  Filled 2024-11-11: qty 60, 20d supply, fill #0

## 2024-11-11 NOTE — Patient Instructions (Addendum)
 VISIT SUMMARY:  Today, we discussed your concerns about blood pressure management and recent severe headaches. We reviewed your medications and made some adjustments to better control your blood pressure. We also addressed your vertigo, cholesterol levels, and ear discomfort.  YOUR PLAN:  -HYPERTENSION: Hypertension means high blood pressure. Your blood pressure has been fluctuating, likely due to your recent weight loss and current medication. We have prescribed losartan  25 mg once daily and advised you to monitor your blood pressure regularly. If your blood pressure consistently falls below 110/60 mmHg, stop the medication and continue to monitor your blood pressure without it. Please use your medication consistently as instructed.  -BENIGN PAROXYSMAL POSITIONAL VERTIGO: This type of vertigo is a condition that causes brief episodes of dizziness. We have refilled your prescription for meclizine  to manage your symptoms.  -HYPERLIPIDEMIA: Hyperlipidemia means having high levels of fats (lipids) in your blood, such as cholesterol. You are currently managing this with atorvastatin . Your recent LDL cholesterol was slightly elevated, so continue taking atorvastatin  as prescribed. We will recheck your cholesterol levels with your upcoming labs.  -CHRONIC ALLERGIC RHINITIS WITH EUSTACHIAN TUBE DYSFUNCTION: This condition involves persistent ear fullness and itching due to dry skin and wax buildup. We recommend using sweet oil for ear itching and provided you with a saline rinse bottle for nasal irrigation. Continue using azelastine  nasal spray as needed. I have prescribed Prednisone  for 5 days to treat the eustachian tube dysfunction.  INSTRUCTIONS:  Please monitor your blood pressure regularly and stop taking your medication if it consistently falls below 110/60 mmHg. Continue taking atorvastatin  as prescribed and we will recheck your cholesterol levels with your upcoming labs. Use sweet oil for ear  itching and the saline rinse bottle for nasal irrigation. Follow up with us  if you have any concerns or if your symptoms persist.

## 2024-11-11 NOTE — Progress Notes (Signed)
 "  Subjective:  Patient ID: Katelyn Hardin, female    DOB: 1971/11/05  Age: 53 y.o. MRN: [id number]  CC: Medical Management of Chronic Issues (Discuss blood pressure)     Discussed the use of AI scribe software for clinical note transcription with the patient, who gave verbal consent to proceed.  History of Present Illness Katelyn Hardin is a 53 year old female with hypertension who presents with concerns about blood pressure management and recent severe headaches.  She reports recent blood pressure fluctuations, including a peak of 165/93 associated with a severe headache from Thursday evening into Saturday morning. The headache was intense, with difficulty keeping her eyes open and some confusion, and improved by late Friday.  She previously took valsartan /hydrochlorothiazide  80/25mg  and also had spironolactone  12.5 mg to use in the event that blood pressures remain elevated above 140/90 while on valsartan /HCTZ.  She had been taking her valsartan /HCTZ as needed only on days when blood pressures were above 140/90 which caused a marked BP drop.  Her blood pressure log reveals fluctuations in blood pressure which have ranged from systolic of 120s to a few intermittent readings of 140s to 150s.  She also takes atorvastatin  and uses meclizine  as needed for vertigo.  She has lost 32 pounds but in the last 3 months has lost 23 pounds and has noticed lower blood pressures since the weight loss. She uses Wegovy  for weight management prescribed by [facility] weight management and has nausea that she attributes to this, requiring Zofran  daily or every other day.  She feels a persistent underwater sensation in her ears with itching. An ENT recommended allergy medication and saline rinse. She also notes dry, itchy in the ears.    Past Medical History:  Diagnosis Date   Anemia    Bacterial vaginosis    Complication of anesthesia    Slow to wake up   Family history of breast  cancer    Fibroids    History of hiatal hernia    History of kidney stones    Hyperparathyroidism    Hypertension    IBS 03/10/2008   Qualifier: Diagnosis of  By: Gladis FNP, [place]kedtra     IBS (irritable bowel syndrome)    Menopausal hot flushes 07/07/2014    Past Surgical History:  Procedure Laterality Date   PARATHYROIDECTOMY N/A 05/09/2019   Procedure: PARATHYROIDECTOMY;  Surgeon: Eletha Boas, MD;  Location: [facility];  Service: General;  Laterality: N/A;   TUBAL LIGATION     TUBAL LIGATION      Family History  Problem Relation Age of Onset   Cervical cancer Mother    Thyroid  disease Sister    Breast cancer Sister 50   Cancer Maternal Grandfather        unknown   Breast cancer Other        dx in her 65s; MGM's sister   Breast cancer Other        5 of MGM sisters   Diabetes Neg Hx     Social History   Socioeconomic History   Marital status: Married    Spouse name: Not on file   Number of children: 2   Years of education: Not on file   Highest education level: Associate degree: occupational, scientist, product/process development, or vocational program  Occupational History   Not on file  Tobacco Use   Smoking status: Never   Smokeless tobacco: Never  Vaping Use   Vaping status: Never Used  Substance and Sexual Activity  Alcohol use: Yes    Alcohol/week: 6.0 standard drinks of alcohol    Types: 2 Glasses of wine, 4 Standard drinks or equivalent per week    Comment: 2 glasses of wine daily   Drug use: No   Sexual activity: Yes    Birth control/protection: Post-menopausal  Other Topics Concern   Not on file  Social History Narrative   Not on file   Social Drivers of Health   Tobacco Use: Low Risk (11/11/2024)   Patient History    Smoking Tobacco Use: Never    Smokeless Tobacco Use: Never    Passive Exposure: Not on file  Financial Resource Strain: Low Risk (10/09/2023)   Overall Financial Resource Strain (CARDIA)    Difficulty of Paying Living Expenses: Not hard at all  Food  Insecurity: Low Risk (10/08/2024)   Received from [facility]   Epic    Within the past 12 months, you worried that your food would run out before you got money to buy more: Never true    Within the past 12 months, the food you bought just didn't last and you didn't have money to get more. : Never true  Transportation Needs: No Transportation Needs (10/08/2024)   Received from [facility]    In the past 12 months, has lack of reliable transportation kept you from medical appointments, meetings, work or from getting things needed for daily living? : No  Physical Activity: Inactive (10/09/2023)   Exercise Vital Sign    Days of Exercise per Week: 0 days    Minutes of Exercise per Session: 0 min  Stress: Stress Concern Present (10/09/2023)   [employer] of Occupational Health - Occupational Stress Questionnaire    Feeling of Stress : To some extent  Social Connections: Socially Integrated (10/09/2023)   Social Connection and Isolation Panel    Frequency of Communication with Friends and Family: Three times a week    Frequency of Social Gatherings with Friends and Family: Once a week    Attends Religious Services: 1 to 4 times per year    Active Member of Clubs or Organizations: Yes    Attends Banker Meetings: More than 4 times per year    Marital Status: Married  Depression (PHQ2-9): Low Risk (05/09/2024)   Depression (PHQ2-9)    PHQ-2 Score: 0  Alcohol Screen: Low Risk (10/09/2023)   Alcohol Screen    Last Alcohol Screening Score (AUDIT): 3  Housing: Low Risk (10/08/2024)   Received from [facility]   Epic    What is your living situation today?: I have a steady place to live    Think about the place you live. Do you have problems with any of the following? Choose all that apply:: None/None on this list  Utilities: Low Risk (10/08/2024)   Received from [facility]   Utilities    In the past 12 months has the electric, gas, oil, or water  company threatened to shut off services in your home? : No  Health Literacy: Adequate Health Literacy (10/09/2023)   B1300 Health Literacy    Frequency of need for help with medical instructions: Never    Allergies[1]  Outpatient Medications Prior to Visit  Medication Sig Dispense Refill   azelastine  (ASTELIN ) 0.1 % nasal spray Insert 2 sprays into both nostrils 2 (two) times daily 30 mL 12   Blood Pressure Monitoring (BLOOD PRESSURE CUFF) MISC Use to check blood pressure once daily. 1 each 0  carbamide peroxide (DEBROX) 6.5 % OTIC solution Place 5 drops into both ears 2 (two) times daily. 15 mL 0   cyclobenzaprine  (FLEXERIL ) 10 MG tablet Take 1 tablet (10 mg total) by mouth 3 (three) times daily as needed for muscle spasms. 30 tablet 1   estradiol  (VIVELLE -DOT) 0.05 MG/24HR patch Place 1 patch (0.05 mg total) onto the skin 2 (two) times a week. 24 patch 3   fexofenadine  (ALLEGRA ) 180 MG tablet Take 1 tablet (180 mg total) by mouth daily. 30 tablet 0   fluticasone  (FLONASE ) 50 MCG/ACT nasal spray Place 1 spray into both nostrils daily. 16 g 2   hydrOXYzine  (ATARAX ) 25 MG tablet Take 1 tablet (25 mg total) by mouth at bedtime as needed. 30 tablet 1   levocetirizine (XYZAL  ALLERGY 24HR) 5 MG tablet Take 1 tablet (5 mg total) by mouth every evening. 90 tablet 1   Multiple Vitamin (MULTIVITAMIN WITH MINERALS) TABS tablet Take 1 tablet by mouth daily.     Probiotic Product (PROBIOTIC PO) Take 1 capsule by mouth daily.     sodium chloride  (ALTAMIST SPRAY) 0.65 % nasal spray Place 1 spray into the nose every 2 (two) hours as needed for congestion 60 mL 0   atorvastatin  (LIPITOR) 20 MG tablet Take 1 tablet (20 mg total) by mouth daily. 90 tablet 1   meclizine  (ANTIVERT ) 25 MG tablet Take 1 tablet (25 mg total) by mouth 3 (three) times daily as needed. 60 tablet 0   ondansetron  (ZOFRAN ) 4 MG tablet Take 1 tablet (4 mg total) by mouth every 8 (eight) hours as needed for nausea or vomiting. 20 tablet 0    spironolactone  (ALDACTONE ) 25 MG tablet Take 0.5 tablets (12.5 mg total) by mouth daily. 45 tablet 1   valsartan -hydrochlorothiazide  (DIOVAN -HCT) 80-12.5 MG tablet Take 1 tablet by mouth daily. 90 tablet 1   progesterone  (PROMETRIUM ) 100 MG capsule Take 1 capsule (100 mg total) by mouth daily. (Patient not taking: Reported on 11/11/2024) 90 capsule 3   benzonatate  (TESSALON ) 100 MG capsule Take 1 capsule (100 mg total) by mouth 2 (two) times daily as needed for cough. (Patient not taking: Reported on 11/11/2024) 20 capsule 0   cephALEXin  (KEFLEX ) 500 MG capsule Take 1 capsule (500 mg total) by mouth 2 (two) times daily. (Patient not taking: Reported on 11/11/2024) 14 capsule 0   No facility-administered medications prior to visit.     ROS Review of Systems  Constitutional:  Negative for activity change and appetite change.  HENT:  Negative for hearing loss, sinus pressure and sore throat.   Respiratory:  Negative for chest tightness, shortness of breath and wheezing.   Cardiovascular:  Negative for chest pain and palpitations.  Gastrointestinal:  Negative for abdominal distention, abdominal pain and constipation.  Genitourinary: Negative.   Musculoskeletal: Negative.   Psychiatric/Behavioral:  Negative for behavioral problems and dysphoric mood.     Objective:  BP 113/77   Pulse 95   Temp 98.1 F (36.7 C) (Oral)   Ht 5' 6 (1.676 m)   Wt 173 lb 12.8 oz (78.8 kg)   LMP 11/19/2016   SpO2 100%   BMI 28.05 kg/m      11/11/2024   10:20 AM 08/26/2024    2:56 PM 08/15/2024    9:28 AM  BP/Weight  Systolic BP 113 105 137  Diastolic BP 77 71 82  Wt. (Lbs) 173.8 191.2 196  BMI 28.05 kg/m2 30.86 kg/m2 31.64 kg/m2    Wt Readings from Last 3 Encounters:  11/11/24 173 lb 12.8 oz (78.8 kg)  08/26/24 191 lb 3.2 oz (86.7 kg)  08/15/24 196 lb (88.9 kg)      Physical Exam Constitutional:      Appearance: She is well-developed.  HENT:     Right Ear: There is no impacted cerumen.      Left Ear: There is impacted cerumen.     Ears:     Comments: Scaly skin in right ear canal  Cardiovascular:     Rate and Rhythm: Normal rate.     Heart sounds: Normal heart sounds. No murmur heard. Pulmonary:     Effort: Pulmonary effort is normal.     Breath sounds: Normal breath sounds. No wheezing or rales.  Chest:     Chest wall: No tenderness.  Abdominal:     General: Bowel sounds are normal. There is no distension.     Palpations: Abdomen is soft. There is no mass.     Tenderness: There is no abdominal tenderness.  Musculoskeletal:        General: Normal range of motion.     Right lower leg: No edema.     Left lower leg: No edema.  Neurological:     Mental Status: She is alert and oriented to person, place, and time.  Psychiatric:        Mood and Affect: Mood normal.        Latest Ref Rng & Units 08/26/2024    3:34 PM 05/09/2024   11:34 AM 10/09/2023   10:57 AM  CMP  Glucose 70 - 99 mg/dL 75  94    BUN 6 - 24 mg/dL 10  12    Creatinine 9.42 - 1.00 mg/dL 9.03  9.01    Sodium 865 - 144 mmol/L 141  139    Potassium 3.5 - 5.2 mmol/L 3.4  3.6    Chloride 96 - 106 mmol/L 100  101    CO2 20 - 29 mmol/L 27  22    Calcium  8.7 - 10.2 mg/dL 9.5  9.2    Total Protein 6.0 - 8.5 g/dL 7.3  7.1  8.1   Total Bilirubin 0.0 - 1.2 mg/dL 0.4  0.4  0.3   Alkaline Phos 49 - 135 IU/L 82  81  107   AST 0 - 40 IU/L 23  18  26    ALT 0 - 32 IU/L 23  17  29      Lipid Panel     Component Value Date/Time   CHOL 196 05/09/2024 1134   TRIG 102 05/09/2024 1134   HDL 38 (L) 05/09/2024 1134   CHOLHDL 4.4 10/23/2019 1110   CHOLHDL 3.6 04/04/2013 1022   VLDL 20 04/04/2013 1022   LDLCALC 139 (H) 05/09/2024 1134    CBC    Component Value Date/Time   WBC 5.6 08/26/2024 1534   WBC 8.2 05/12/2019 2110   RBC 3.87 08/26/2024 1534   RBC 3.44 (L) 05/12/2019 2110   HGB 11.7 08/26/2024 1534   HCT 35.9 08/26/2024 1534   PLT 340 08/26/2024 1534   MCV 93 08/26/2024 1534   MCH 30.2  08/26/2024 1534   MCH 31.7 05/12/2019 2110   MCHC 32.6 08/26/2024 1534   MCHC 32.2 05/12/2019 2110   RDW 13.3 08/26/2024 1534   LYMPHSABS 2.7 10/09/2023 1057   MONOABS 0.5 02/07/2014 1711   EOSABS 0.1 10/09/2023 1057   BASOSABS 0.0 10/09/2023 1057    Lab Results  Component Value Date   HGBA1C 5.3 05/09/2024  Assessment and Plan Assessment & Plan Hypertension Fluctuating blood pressure due to weight loss and current medication. . Low potassium contraindicates hydrochlorothiazide . - Prescribed losartan  25 mg once daily. -Discontinue valsartan /HCTZ, discontinue spironolactone  - Instructed to monitor blood pressure regularly. - Advised to stop medication if blood pressure consistently falls below 110/60 mmHg and monitor without medication. - Educated on consistent medication use.  Benign paroxysmal positional vertigo Intermittent vertigo managed with meclizine . - Refilled meclizine  prescription.  Hyperlipidemia Managed with atorvastatin . Recent LDL slightly elevated. - Continue atorvastatin  as prescribed. - Will recheck cholesterol levels with upcoming labs.  Chronic allergic rhinitis with eustachian tube dysfunction Persistent ear fullness and itching due to dry skin and wax buildup. - Recommended sweet oil for ear itching. - Provided saline rinse bottle for nasal irrigation. - Continue azelastine  nasal spray as needed. - Prescribed prednisone  20 mg for 5 days      Meds ordered this encounter  Medications   losartan  (COZAAR ) 25 MG tablet    Sig: Take 1 tablet (25 mg total) by mouth daily.    Dispense:  90 tablet    Refill:  1   atorvastatin  (LIPITOR) 20 MG tablet    Sig: Take 1 tablet (20 mg total) by mouth daily.    Dispense:  90 tablet    Refill:  1   meclizine  (ANTIVERT ) 25 MG tablet    Sig: Take 1 tablet (25 mg total) by mouth 3 (three) times daily as needed.    Dispense:  60 tablet    Refill:  0   ondansetron  (ZOFRAN ) 4 MG tablet    Sig: Take 1  tablet (4 mg total) by mouth every 8 (eight) hours as needed for nausea or vomiting.    Dispense:  60 tablet    Refill:  1    Follow-up: Return in about 6 months (around 05/12/2025) for Chronic medical conditions.       Corrina Sabin, MD, FAAFP. [facility] [place], [place] [phone]   11/11/2024, 10:48 AM    [1]  Allergies Allergen Reactions   Acetaminophen  Other (See Comments)    Jittery and upset stomach   Aspirin Nausea And Vomiting   Codeine     Jittery and upset stomach   Oxycodone -Acetaminophen      Jittery and upset stomach   Vicodin [Hydrocodone -Acetaminophen ]     Jittery and upset stomach   "

## 2024-11-12 ENCOUNTER — Ambulatory Visit: Payer: Self-pay | Admitting: Family Medicine

## 2024-11-12 ENCOUNTER — Encounter: Payer: Self-pay | Admitting: Family Medicine

## 2024-11-12 LAB — CMP14+EGFR
ALT: 17 IU/L (ref 0–32)
AST: 19 IU/L (ref 0–40)
Albumin: 4.6 g/dL (ref 3.8–4.9)
Alkaline Phosphatase: 96 IU/L (ref 49–135)
BUN/Creatinine Ratio: 13 (ref 9–23)
BUN: 13 mg/dL (ref 6–24)
Bilirubin Total: 0.4 mg/dL (ref 0.0–1.2)
CO2: 24 mmol/L (ref 20–29)
Calcium: 9.8 mg/dL (ref 8.7–10.2)
Chloride: 100 mmol/L (ref 96–106)
Creatinine, Ser: 1.02 mg/dL — ABNORMAL HIGH (ref 0.57–1.00)
Globulin, Total: 3.3 g/dL (ref 1.5–4.5)
Glucose: 84 mg/dL (ref 70–99)
Potassium: 4.2 mmol/L (ref 3.5–5.2)
Sodium: 141 mmol/L (ref 134–144)
Total Protein: 7.9 g/dL (ref 6.0–8.5)
eGFR: 65 mL/min/1.73

## 2024-11-12 LAB — LP+NON-HDL CHOLESTEROL
Cholesterol, Total: 147 mg/dL (ref 100–199)
HDL: 38 mg/dL — ABNORMAL LOW
LDL Chol Calc (NIH): 95 mg/dL (ref 0–99)
Total Non-HDL-Chol (LDL+VLDL): 109 mg/dL (ref 0–129)
Triglycerides: 73 mg/dL (ref 0–149)
VLDL Cholesterol Cal: 14 mg/dL (ref 5–40)

## 2024-11-12 LAB — HEPATITIS B SURFACE ANTIBODY, QUANTITATIVE: Hepatitis B Surf Ab Quant: <3.5 m[IU]/mL — ABNORMAL LOW

## 2024-11-26 DIAGNOSIS — G8929 Other chronic pain: Secondary | ICD-10-CM

## 2025-05-13 ENCOUNTER — Ambulatory Visit: Payer: Self-pay | Admitting: Family Medicine
